# Patient Record
Sex: Male | Born: 1965 | Race: White | Hispanic: No | Marital: Single | Smoking: Current every day smoker
Health system: Southern US, Community
[De-identification: ages and names within clinical notes are randomized; demographics above are authoritative.]

## PROBLEM LIST (undated history)

## (undated) DIAGNOSIS — J449 Chronic obstructive pulmonary disease, unspecified: Secondary | ICD-10-CM

---

## 2017-05-08 ENCOUNTER — Emergency Department (HOSPITAL_COMMUNITY): Payer: No Typology Code available for payment source

## 2017-05-08 ENCOUNTER — Inpatient Hospital Stay (HOSPITAL_COMMUNITY)
Admission: EM | Admit: 2017-05-08 | Discharge: 2017-06-06 | DRG: 981 | Disposition: A | Discharge status: Rehabilitation Facility | Payer: No Typology Code available for payment source | Attending: General Surgery | Admitting: General Surgery

## 2017-05-08 ENCOUNTER — Inpatient Hospital Stay (HOSPITAL_COMMUNITY): Payer: No Typology Code available for payment source

## 2017-05-08 ENCOUNTER — Inpatient Hospital Stay (HOSPITAL_COMMUNITY): Payer: No Typology Code available for payment source | Admitting: Anesthesiology

## 2017-05-08 ENCOUNTER — Encounter (HOSPITAL_COMMUNITY): Payer: Self-pay | Admitting: Neurological Surgery

## 2017-05-08 ENCOUNTER — Encounter (HOSPITAL_COMMUNITY): Admission: EM | Disposition: A | Discharge status: Rehabilitation Facility | Payer: Self-pay | Source: Home / Self Care

## 2017-05-08 DIAGNOSIS — Z978 Presence of other specified devices: Secondary | ICD-10-CM

## 2017-05-08 DIAGNOSIS — Z23 Encounter for immunization: Secondary | ICD-10-CM

## 2017-05-08 DIAGNOSIS — Z9981 Dependence on supplemental oxygen: Secondary | ICD-10-CM

## 2017-05-08 DIAGNOSIS — S0285XA Fracture of orbit, unspecified, initial encounter for closed fracture: Secondary | ICD-10-CM

## 2017-05-08 DIAGNOSIS — S066X9A Traumatic subarachnoid hemorrhage with loss of consciousness of unspecified duration, initial encounter: Secondary | ICD-10-CM | POA: Diagnosis present

## 2017-05-08 DIAGNOSIS — S020XXA Fracture of vault of skull, initial encounter for closed fracture: Secondary | ICD-10-CM | POA: Diagnosis present

## 2017-05-08 DIAGNOSIS — R001 Bradycardia, unspecified: Secondary | ICD-10-CM | POA: Diagnosis not present

## 2017-05-08 DIAGNOSIS — R402352 Coma scale, best motor response, localizes pain, at arrival to emergency department: Secondary | ICD-10-CM | POA: Diagnosis present

## 2017-05-08 DIAGNOSIS — M25361 Other instability, right knee: Secondary | ICD-10-CM | POA: Diagnosis present

## 2017-05-08 DIAGNOSIS — T380X5A Adverse effect of glucocorticoids and synthetic analogues, initial encounter: Secondary | ICD-10-CM

## 2017-05-08 DIAGNOSIS — S52321A Displaced transverse fracture of shaft of right radius, initial encounter for closed fracture: Secondary | ICD-10-CM | POA: Diagnosis present

## 2017-05-08 DIAGNOSIS — D62 Acute posthemorrhagic anemia: Secondary | ICD-10-CM | POA: Diagnosis not present

## 2017-05-08 DIAGNOSIS — F411 Generalized anxiety disorder: Secondary | ICD-10-CM

## 2017-05-08 DIAGNOSIS — B9561 Methicillin susceptible Staphylococcus aureus infection as the cause of diseases classified elsewhere: Secondary | ICD-10-CM | POA: Diagnosis not present

## 2017-05-08 DIAGNOSIS — R609 Edema, unspecified: Secondary | ICD-10-CM

## 2017-05-08 DIAGNOSIS — S022XXA Fracture of nasal bones, initial encounter for closed fracture: Secondary | ICD-10-CM | POA: Diagnosis present

## 2017-05-08 DIAGNOSIS — E873 Alkalosis: Secondary | ICD-10-CM | POA: Diagnosis present

## 2017-05-08 DIAGNOSIS — Z4659 Encounter for fitting and adjustment of other gastrointestinal appliance and device: Secondary | ICD-10-CM

## 2017-05-08 DIAGNOSIS — Z419 Encounter for procedure for purposes other than remedying health state, unspecified: Secondary | ICD-10-CM

## 2017-05-08 DIAGNOSIS — E876 Hypokalemia: Secondary | ICD-10-CM | POA: Diagnosis not present

## 2017-05-08 DIAGNOSIS — R0689 Other abnormalities of breathing: Secondary | ICD-10-CM

## 2017-05-08 DIAGNOSIS — J9602 Acute respiratory failure with hypercapnia: Secondary | ICD-10-CM | POA: Diagnosis not present

## 2017-05-08 DIAGNOSIS — Y95 Nosocomial condition: Secondary | ICD-10-CM | POA: Diagnosis not present

## 2017-05-08 DIAGNOSIS — S0282XA Fracture of other specified skull and facial bones, left side, initial encounter for closed fracture: Secondary | ICD-10-CM | POA: Diagnosis present

## 2017-05-08 DIAGNOSIS — S5291XA Unspecified fracture of right forearm, initial encounter for closed fracture: Secondary | ICD-10-CM

## 2017-05-08 DIAGNOSIS — Z808 Family history of malignant neoplasm of other organs or systems: Secondary | ICD-10-CM

## 2017-05-08 DIAGNOSIS — R739 Hyperglycemia, unspecified: Secondary | ICD-10-CM

## 2017-05-08 DIAGNOSIS — Z9911 Dependence on respirator [ventilator] status: Secondary | ICD-10-CM | POA: Diagnosis not present

## 2017-05-08 DIAGNOSIS — I609 Nontraumatic subarachnoid hemorrhage, unspecified: Secondary | ICD-10-CM

## 2017-05-08 DIAGNOSIS — S5292XA Unspecified fracture of left forearm, initial encounter for closed fracture: Secondary | ICD-10-CM

## 2017-05-08 DIAGNOSIS — T1490XA Injury, unspecified, initial encounter: Secondary | ICD-10-CM | POA: Diagnosis present

## 2017-05-08 DIAGNOSIS — S066X9D Traumatic subarachnoid hemorrhage with loss of consciousness of unspecified duration, subsequent encounter: Secondary | ICD-10-CM | POA: Diagnosis not present

## 2017-05-08 DIAGNOSIS — S0292XA Unspecified fracture of facial bones, initial encounter for closed fracture: Secondary | ICD-10-CM

## 2017-05-08 DIAGNOSIS — G9389 Other specified disorders of brain: Secondary | ICD-10-CM | POA: Diagnosis present

## 2017-05-08 DIAGNOSIS — E871 Hypo-osmolality and hyponatremia: Secondary | ICD-10-CM

## 2017-05-08 DIAGNOSIS — S069X9A Unspecified intracranial injury with loss of consciousness of unspecified duration, initial encounter: Secondary | ICD-10-CM

## 2017-05-08 DIAGNOSIS — Z01818 Encounter for other preprocedural examination: Secondary | ICD-10-CM

## 2017-05-08 DIAGNOSIS — R52 Pain, unspecified: Secondary | ICD-10-CM

## 2017-05-08 DIAGNOSIS — R402112 Coma scale, eyes open, never, at arrival to emergency department: Secondary | ICD-10-CM | POA: Diagnosis present

## 2017-05-08 DIAGNOSIS — S062X9A Diffuse traumatic brain injury with loss of consciousness of unspecified duration, initial encounter: Secondary | ICD-10-CM | POA: Diagnosis present

## 2017-05-08 DIAGNOSIS — J9690 Respiratory failure, unspecified, unspecified whether with hypoxia or hypercapnia: Secondary | ICD-10-CM

## 2017-05-08 DIAGNOSIS — G8918 Other acute postprocedural pain: Secondary | ICD-10-CM

## 2017-05-08 DIAGNOSIS — D6489 Other specified anemias: Secondary | ICD-10-CM | POA: Diagnosis not present

## 2017-05-08 DIAGNOSIS — S52221A Displaced transverse fracture of shaft of right ulna, initial encounter for closed fracture: Secondary | ICD-10-CM | POA: Diagnosis present

## 2017-05-08 DIAGNOSIS — J189 Pneumonia, unspecified organism: Secondary | ICD-10-CM | POA: Diagnosis not present

## 2017-05-08 DIAGNOSIS — R451 Restlessness and agitation: Secondary | ICD-10-CM

## 2017-05-08 DIAGNOSIS — T148XXA Other injury of unspecified body region, initial encounter: Secondary | ICD-10-CM

## 2017-05-08 DIAGNOSIS — R402222 Coma scale, best verbal response, incomprehensible words, at arrival to emergency department: Secondary | ICD-10-CM | POA: Diagnosis present

## 2017-05-08 DIAGNOSIS — S0291XA Unspecified fracture of skull, initial encounter for closed fracture: Secondary | ICD-10-CM | POA: Diagnosis present

## 2017-05-08 DIAGNOSIS — D72829 Elevated white blood cell count, unspecified: Secondary | ICD-10-CM

## 2017-05-08 DIAGNOSIS — Z801 Family history of malignant neoplasm of trachea, bronchus and lung: Secondary | ICD-10-CM

## 2017-05-08 DIAGNOSIS — S299XXA Unspecified injury of thorax, initial encounter: Secondary | ICD-10-CM

## 2017-05-08 DIAGNOSIS — F17210 Nicotine dependence, cigarettes, uncomplicated: Secondary | ICD-10-CM | POA: Diagnosis present

## 2017-05-08 DIAGNOSIS — S83519A Sprain of anterior cruciate ligament of unspecified knee, initial encounter: Secondary | ICD-10-CM

## 2017-05-08 DIAGNOSIS — Z781 Physical restraint status: Secondary | ICD-10-CM

## 2017-05-08 DIAGNOSIS — F1910 Other psychoactive substance abuse, uncomplicated: Secondary | ICD-10-CM

## 2017-05-08 DIAGNOSIS — S52202A Unspecified fracture of shaft of left ulna, initial encounter for closed fracture: Secondary | ICD-10-CM

## 2017-05-08 HISTORY — PX: ORIF RADIAL FRACTURE: SHX5113

## 2017-05-08 LAB — I-STAT CHEM 8, ED
BUN: 19 mg/dL (ref 6–20)
CREATININE: 1.1 mg/dL (ref 0.61–1.24)
Calcium, Ion: 1.12 mmol/L — ABNORMAL LOW (ref 1.15–1.40)
Chloride: 105 mmol/L (ref 101–111)
Glucose, Bld: 117 mg/dL — ABNORMAL HIGH (ref 65–99)
HEMATOCRIT: 43 % (ref 39.0–52.0)
HEMOGLOBIN: 14.6 g/dL (ref 13.0–17.0)
Potassium: 3.8 mmol/L (ref 3.5–5.1)
Sodium: 140 mmol/L (ref 135–145)
TCO2: 25 mmol/L (ref 22–32)

## 2017-05-08 LAB — POCT I-STAT 3, ART BLOOD GAS (G3+)
Acid-base deficit: 4 mmol/L — ABNORMAL HIGH (ref 0.0–2.0)
Bicarbonate: 21.3 mmol/L (ref 20.0–28.0)
O2 Saturation: 98 %
PCO2 ART: 37.8 mmHg (ref 32.0–48.0)
PH ART: 7.358 (ref 7.350–7.450)
TCO2: 22 mmol/L (ref 22–32)
pO2, Arterial: 118 mmHg — ABNORMAL HIGH (ref 83.0–108.0)

## 2017-05-08 LAB — PREPARE FRESH FROZEN PLASMA
UNIT DIVISION: 0
Unit division: 0

## 2017-05-08 LAB — BPAM FFP
BLOOD PRODUCT EXPIRATION DATE: 201812232359
Blood Product Expiration Date: 201812232359
ISSUE DATE / TIME: 201812110144
ISSUE DATE / TIME: 201812110144
UNIT TYPE AND RH: 6200
Unit Type and Rh: 6200

## 2017-05-08 LAB — URINALYSIS, ROUTINE W REFLEX MICROSCOPIC
Glucose, UA: NEGATIVE mg/dL
KETONES UR: NEGATIVE mg/dL
Leukocytes, UA: NEGATIVE
Nitrite: NEGATIVE
PH: 5 (ref 5.0–8.0)
Protein, ur: 100 mg/dL — AB
Specific Gravity, Urine: 1.03 (ref 1.005–1.030)
Squamous Epithelial / LPF: NONE SEEN

## 2017-05-08 LAB — CBC
HEMATOCRIT: 40.6 % (ref 39.0–52.0)
Hemoglobin: 14 g/dL (ref 13.0–17.0)
MCH: 30 pg (ref 26.0–34.0)
MCHC: 34.5 g/dL (ref 30.0–36.0)
MCV: 87.1 fL (ref 78.0–100.0)
Platelets: 396 10*3/uL (ref 150–400)
RBC: 4.66 MIL/uL (ref 4.22–5.81)
RDW: 13.2 % (ref 11.5–15.5)
WBC: 18.5 10*3/uL — ABNORMAL HIGH (ref 4.0–10.5)

## 2017-05-08 LAB — I-STAT ARTERIAL BLOOD GAS, ED
ACID-BASE DEFICIT: 2 mmol/L (ref 0.0–2.0)
Bicarbonate: 26.5 mmol/L (ref 20.0–28.0)
O2 SAT: 100 %
PH ART: 7.238 — AB (ref 7.350–7.450)
TCO2: 28 mmol/L (ref 22–32)
pCO2 arterial: 62 mmHg — ABNORMAL HIGH (ref 32.0–48.0)
pO2, Arterial: 442 mmHg — ABNORMAL HIGH (ref 83.0–108.0)

## 2017-05-08 LAB — COMPREHENSIVE METABOLIC PANEL
ALT: 54 U/L (ref 17–63)
AST: 94 U/L — AB (ref 15–41)
Albumin: 3.9 g/dL (ref 3.5–5.0)
Alkaline Phosphatase: 104 U/L (ref 38–126)
Anion gap: 12 (ref 5–15)
BILIRUBIN TOTAL: 0.4 mg/dL (ref 0.3–1.2)
BUN: 18 mg/dL (ref 6–20)
CO2: 22 mmol/L (ref 22–32)
CREATININE: 1.23 mg/dL (ref 0.61–1.24)
Calcium: 8.9 mg/dL (ref 8.9–10.3)
Chloride: 102 mmol/L (ref 101–111)
GFR, EST AFRICAN AMERICAN: 40 mL/min — AB (ref >60)
GFR, EST NON AFRICAN AMERICAN: 34 mL/min — AB (ref >60)
Glucose, Bld: 114 mg/dL — ABNORMAL HIGH (ref 65–99)
POTASSIUM: 3.7 mmol/L (ref 3.5–5.1)
Sodium: 136 mmol/L (ref 135–145)
Total Protein: 7.5 g/dL (ref 6.5–8.1)

## 2017-05-08 LAB — TRIGLYCERIDES: TRIGLYCERIDES: 64 mg/dL (ref <150)

## 2017-05-08 LAB — LACTIC ACID, PLASMA: Lactic Acid, Venous: 0.9 mmol/L (ref 0.5–1.9)

## 2017-05-08 LAB — I-STAT CG4 LACTIC ACID, ED: LACTIC ACID, VENOUS: 2.9 mmol/L — AB (ref 0.5–1.9)

## 2017-05-08 LAB — RAPID URINE DRUG SCREEN, HOSP PERFORMED
AMPHETAMINES: POSITIVE — AB
Barbiturates: NOT DETECTED
Benzodiazepines: NOT DETECTED
Cocaine: NOT DETECTED
Opiates: POSITIVE — AB
TETRAHYDROCANNABINOL: POSITIVE — AB

## 2017-05-08 LAB — BLOOD PRODUCT ORDER (VERBAL) VERIFICATION

## 2017-05-08 LAB — CBG MONITORING, ED: GLUCOSE-CAPILLARY: 125 mg/dL — AB (ref 65–99)

## 2017-05-08 LAB — ETHANOL: Alcohol, Ethyl (B): <10 mg/dL (ref <10)

## 2017-05-08 LAB — CDS SEROLOGY

## 2017-05-08 LAB — ABO/RH: ABO/RH(D): AB POS

## 2017-05-08 LAB — PROTIME-INR
INR: 1.12
Prothrombin Time: 14.3 seconds (ref 11.4–15.2)

## 2017-05-08 SURGERY — OPEN REDUCTION INTERNAL FIXATION (ORIF) RADIAL FRACTURE
Anesthesia: General | Site: Arm Lower | Laterality: Right

## 2017-05-08 MED ORDER — CEFAZOLIN SODIUM-DEXTROSE 2-3 GM-%(50ML) IV SOLR
INTRAVENOUS | Status: DC | PRN
  Administered 2017-05-08: 2 g via INTRAVENOUS

## 2017-05-08 MED ORDER — CHLORHEXIDINE GLUCONATE 0.12% ORAL RINSE (MEDLINE KIT)
15.0000 mL | Freq: Two times a day (BID) | OROMUCOSAL | Status: DC
Start: 2017-05-08 — End: 2017-05-26
  Administered 2017-05-08 – 2017-05-25 (×35): 15 mL via OROMUCOSAL

## 2017-05-08 MED ORDER — FENTANYL CITRATE (PF) 250 MCG/5ML IJ SOLN
INTRAMUSCULAR | Status: AC
  Filled 2017-05-08: qty 5

## 2017-05-08 MED ORDER — VANCOMYCIN HCL 1000 MG IV SOLR
INTRAVENOUS | Status: DC | PRN
  Administered 2017-05-08: 1000 mg

## 2017-05-08 MED ORDER — SUCCINYLCHOLINE CHLORIDE 20 MG/ML IJ SOLN
INTRAMUSCULAR | Status: AC | PRN
  Administered 2017-05-08: 150 mg via INTRAVENOUS

## 2017-05-08 MED ORDER — FENTANYL 2500MCG IN NS 250ML (10MCG/ML) PREMIX INFUSION
25.0000 ug/h | INTRAVENOUS | Status: DC
  Administered 2017-05-08: 100 ug/h via INTRAVENOUS
  Administered 2017-05-09: 150 ug/h via INTRAVENOUS
  Administered 2017-05-10 – 2017-05-12 (×5): 200 ug/h via INTRAVENOUS
  Filled 2017-05-08 (×7): qty 250

## 2017-05-08 MED ORDER — FENTANYL 2500MCG IN NS 250ML (10MCG/ML) PREMIX INFUSION
20.0000 ug/h | INTRAVENOUS | Status: DC
  Administered 2017-05-08: 50 ug/h via INTRAVENOUS
  Filled 2017-05-08: qty 250

## 2017-05-08 MED ORDER — ONDANSETRON HCL 4 MG/2ML IJ SOLN
4.0000 mg | Freq: Four times a day (QID) | INTRAMUSCULAR | Status: DC | PRN
Start: 2017-05-08 — End: 2017-06-06
  Administered 2017-05-29 – 2017-06-05 (×6): 4 mg via INTRAVENOUS
  Filled 2017-05-08 (×6): qty 2

## 2017-05-08 MED ORDER — MIDAZOLAM HCL 2 MG/2ML IJ SOLN
INTRAMUSCULAR | Status: AC
  Filled 2017-05-08: qty 2

## 2017-05-08 MED ORDER — BACITRACIN ZINC 500 UNIT/GM EX OINT
TOPICAL_OINTMENT | CUTANEOUS | Status: AC
  Filled 2017-05-08: qty 28.35

## 2017-05-08 MED ORDER — SODIUM CHLORIDE 0.9% FLUSH
10.0000 mL | INTRAVENOUS | Status: DC | PRN
  Administered 2017-05-29: 10 mL
  Filled 2017-05-08: qty 40

## 2017-05-08 MED ORDER — FENTANYL BOLUS VIA INFUSION
50.0000 ug | INTRAVENOUS | Status: DC | PRN
  Administered 2017-05-09 – 2017-05-10 (×5): 50 ug via INTRAVENOUS
  Filled 2017-05-08: qty 50

## 2017-05-08 MED ORDER — FENTANYL CITRATE (PF) 100 MCG/2ML IJ SOLN
50.0000 ug | Freq: Once | INTRAMUSCULAR | Status: AC
  Administered 2017-05-08: 100 ug via INTRAVENOUS
  Administered 2017-05-08 (×3): 50 ug via INTRAVENOUS

## 2017-05-08 MED ORDER — SODIUM CHLORIDE 0.9 % IV SOLN
INTRAVENOUS | Status: AC
  Administered 2017-05-08 – 2017-05-22 (×17): via INTRAVENOUS
  Administered 2017-05-23: 40 mL/h via INTRAVENOUS
  Administered 2017-05-24: 11:00:00 via INTRAVENOUS
  Administered 2017-05-25: 40 mL/h via INTRAVENOUS
  Administered 2017-05-26: 1000 mL via INTRAVENOUS

## 2017-05-08 MED ORDER — MIDAZOLAM HCL 5 MG/5ML IJ SOLN
INTRAMUSCULAR | Status: DC | PRN
  Administered 2017-05-08: 2 mg via INTRAVENOUS

## 2017-05-08 MED ORDER — ORAL CARE MOUTH RINSE
15.0000 mL | OROMUCOSAL | Status: DC
  Administered 2017-05-08 – 2017-05-25 (×172): 15 mL via OROMUCOSAL

## 2017-05-08 MED ORDER — IPRATROPIUM-ALBUTEROL 0.5-2.5 (3) MG/3ML IN SOLN
3.0000 mL | RESPIRATORY_TRACT | Status: DC | PRN
  Filled 2017-05-08: qty 3

## 2017-05-08 MED ORDER — PANTOPRAZOLE SODIUM 40 MG PO TBEC
40.0000 mg | DELAYED_RELEASE_TABLET | Freq: Every day | ORAL | Status: DC
  Administered 2017-05-12 – 2017-05-30 (×4): 40 mg via ORAL
  Filled 2017-05-08 (×5): qty 1

## 2017-05-08 MED ORDER — VANCOMYCIN HCL 1000 MG IV SOLR
INTRAVENOUS | Status: AC
  Filled 2017-05-08: qty 1000

## 2017-05-08 MED ORDER — HYDRALAZINE HCL 20 MG/ML IJ SOLN
10.0000 mg | INTRAMUSCULAR | Status: DC | PRN
  Administered 2017-05-16: 10 mg via INTRAVENOUS
  Filled 2017-05-08: qty 1

## 2017-05-08 MED ORDER — TETANUS-DIPHTH-ACELL PERTUSSIS 5-2.5-18.5 LF-MCG/0.5 IM SUSP
0.5000 mL | Freq: Once | INTRAMUSCULAR | Status: AC
  Administered 2017-05-08: 0.5 mL via INTRAMUSCULAR

## 2017-05-08 MED ORDER — 0.9 % SODIUM CHLORIDE (POUR BTL) OPTIME
TOPICAL | Status: DC | PRN
  Administered 2017-05-08: 1000 mL

## 2017-05-08 MED ORDER — PANTOPRAZOLE SODIUM 40 MG IV SOLR
40.0000 mg | Freq: Every day | INTRAVENOUS | Status: DC
  Administered 2017-05-08 – 2017-05-28 (×19): 40 mg via INTRAVENOUS
  Filled 2017-05-08 (×19): qty 40

## 2017-05-08 MED ORDER — PROPOFOL 1000 MG/100ML IV EMUL
INTRAVENOUS | Status: AC
  Administered 2017-05-08: 20 mg/kg/h
  Filled 2017-05-08: qty 100

## 2017-05-08 MED ORDER — SODIUM CHLORIDE 0.9% FLUSH
10.0000 mL | Freq: Two times a day (BID) | INTRAVENOUS | Status: DC
  Administered 2017-05-08 – 2017-05-13 (×8): 10 mL
  Administered 2017-05-13: 20 mL
  Administered 2017-05-14 – 2017-05-16 (×6): 10 mL
  Administered 2017-05-17: 30 mL
  Administered 2017-05-17 – 2017-06-05 (×37): 10 mL
  Administered 2017-06-06: 20 mL

## 2017-05-08 MED ORDER — ROCURONIUM BROMIDE 100 MG/10ML IV SOLN
INTRAVENOUS | Status: DC | PRN
  Administered 2017-05-08: 20 mg via INTRAVENOUS
  Administered 2017-05-08: 30 mg via INTRAVENOUS
  Administered 2017-05-08: 50 mg via INTRAVENOUS

## 2017-05-08 MED ORDER — BISACODYL 10 MG RE SUPP
10.0000 mg | Freq: Every day | RECTAL | Status: DC | PRN
  Administered 2017-05-11 – 2017-05-20 (×3): 10 mg via RECTAL
  Filled 2017-05-08 (×4): qty 1

## 2017-05-08 MED ORDER — IOPAMIDOL (ISOVUE-300) INJECTION 61%
INTRAVENOUS | Status: AC
  Administered 2017-05-08: 100 mL
  Filled 2017-05-08: qty 100

## 2017-05-08 MED ORDER — DOCUSATE SODIUM 50 MG/5ML PO LIQD
100.0000 mg | Freq: Two times a day (BID) | ORAL | Status: DC | PRN
  Administered 2017-05-10 – 2017-05-31 (×4): 100 mg
  Filled 2017-05-08 (×5): qty 10

## 2017-05-08 MED ORDER — CEFAZOLIN SODIUM-DEXTROSE 1-4 GM/50ML-% IV SOLN
1.0000 g | Freq: Three times a day (TID) | INTRAVENOUS | Status: AC
  Administered 2017-05-08 – 2017-05-09 (×3): 1 g via INTRAVENOUS
  Filled 2017-05-08 (×3): qty 50

## 2017-05-08 MED ORDER — CHLORHEXIDINE GLUCONATE CLOTH 2 % EX PADS
6.0000 | MEDICATED_PAD | Freq: Every day | CUTANEOUS | Status: DC
Start: 2017-05-08 — End: 2017-05-09
  Administered 2017-05-08: 6 via TOPICAL

## 2017-05-08 MED ORDER — ONDANSETRON 4 MG PO TBDP: 4.0000 mg | ORAL_TABLET | Freq: Four times a day (QID) | ORAL | Status: DC | PRN

## 2017-05-08 MED ORDER — PROPOFOL 1000 MG/100ML IV EMUL
0.0000 ug/kg/min | INTRAVENOUS | Status: DC
  Administered 2017-05-08: 15 ug/kg/min via INTRAVENOUS
  Administered 2017-05-09: 10 ug/kg/min via INTRAVENOUS
  Administered 2017-05-10: 15 ug/kg/min via INTRAVENOUS
  Administered 2017-05-10 – 2017-05-11 (×4): 20 ug/kg/min via INTRAVENOUS
  Administered 2017-05-11 – 2017-05-12 (×2): 25 ug/kg/min via INTRAVENOUS
  Administered 2017-05-12: 21 ug/kg/min via INTRAVENOUS
  Administered 2017-05-13 (×2): 35.111 ug/kg/min via INTRAVENOUS
  Administered 2017-05-13: 35 ug/kg/min via INTRAVENOUS
  Administered 2017-05-14: 20 ug/kg/min via INTRAVENOUS
  Administered 2017-05-14: 15 ug/kg/min via INTRAVENOUS
  Administered 2017-05-15: 30 ug/kg/min via INTRAVENOUS
  Administered 2017-05-15: 20 ug/kg/min via INTRAVENOUS
  Administered 2017-05-15 – 2017-05-16 (×3): 40 ug/kg/min via INTRAVENOUS
  Filled 2017-05-08 (×23): qty 100

## 2017-05-08 MED ORDER — ETOMIDATE 2 MG/ML IV SOLN
INTRAVENOUS | Status: AC | PRN
  Administered 2017-05-08: 20 mg via INTRAVENOUS

## 2017-05-08 MED ORDER — PROPOFOL 1000 MG/100ML IV EMUL
INTRAVENOUS | Status: AC
  Administered 2017-05-08: 04:00:00
  Filled 2017-05-08: qty 100

## 2017-05-08 MED ORDER — LACTATED RINGERS IV SOLN
INTRAVENOUS | Status: DC | PRN
  Administered 2017-05-08: 13:00:00 via INTRAVENOUS

## 2017-05-08 MED ORDER — TETANUS-DIPHTH-ACELL PERTUSSIS 5-2.5-18.5 LF-MCG/0.5 IM SUSP
INTRAMUSCULAR | Status: AC
  Filled 2017-05-08: qty 0.5

## 2017-05-08 SURGICAL SUPPLY — 60 items
BANDAGE ACE 3X5.8 VEL STRL LF (GAUZE/BANDAGES/DRESSINGS) ×2 IMPLANT
BANDAGE ACE 4X5 VEL STRL LF (GAUZE/BANDAGES/DRESSINGS) ×2 IMPLANT
BIT DRILL 2.5X110 QC LCP DISP (BIT) ×2 IMPLANT
BIT DRILL LONG 2.7 (BIT) ×1 IMPLANT
BIT DRILL QC 2X125 (BIT) ×1 IMPLANT
BLADE MINI RND TIP GREEN BEAV (BLADE) ×2 IMPLANT
BLADE SURG 15 STRL LF DISP TIS (BLADE) ×1 IMPLANT
BLADE SURG 15 STRL SS (BLADE) ×1
BNDG ESMARK 4X9 LF (GAUZE/BANDAGES/DRESSINGS) ×2 IMPLANT
BRUSH SCRUB EZ PLAIN DRY (MISCELLANEOUS) ×2 IMPLANT
CANISTER SUCT 3000ML PPV (MISCELLANEOUS) ×2 IMPLANT
CHLORAPREP W/TINT 26ML (MISCELLANEOUS) ×2 IMPLANT
COVER BACK TABLE 60X90IN (DRAPES) ×2 IMPLANT
COVER SURGICAL LIGHT HANDLE (MISCELLANEOUS) ×2 IMPLANT
CUFF TOURNIQUET SINGLE 18IN (TOURNIQUET CUFF) ×2 IMPLANT
DRAPE C-ARM 42X72 X-RAY (DRAPES) ×2 IMPLANT
DRAPE SURG 17X23 STRL (DRAPES) ×2 IMPLANT
DRILL BIT LONG 2.7 (BIT) ×2
DRILL BIT QC 2X125 (BIT) ×1
DRSG ADAPTIC 3X8 NADH LF (GAUZE/BANDAGES/DRESSINGS) ×2 IMPLANT
GAUZE SPONGE 4X4 12PLY STRL LF (GAUZE/BANDAGES/DRESSINGS) ×2 IMPLANT
GLOVE BIO SURGEON STRL SZ7.5 (GLOVE) ×12 IMPLANT
GLOVE BIOGEL PI IND STRL 8 (GLOVE) ×2 IMPLANT
GLOVE BIOGEL PI INDICATOR 8 (GLOVE) ×2
GLOVE ECLIPSE 7.5 STRL STRAW (GLOVE) ×2 IMPLANT
GOWN STRL REUS W/ TWL XL LVL3 (GOWN DISPOSABLE) ×5 IMPLANT
GOWN STRL REUS W/TWL XL LVL3 (GOWN DISPOSABLE) ×5
KIT BASIN OR (CUSTOM PROCEDURE TRAY) ×2 IMPLANT
NS IRRIG 1000ML POUR BTL (IV SOLUTION) ×2 IMPLANT
PACK ORTHO EXTREMITY (CUSTOM PROCEDURE TRAY) ×2 IMPLANT
PAD ABD 8X10 STRL (GAUZE/BANDAGES/DRESSINGS) ×2 IMPLANT
PAD CAST 4YDX4 CTTN HI CHSV (CAST SUPPLIES) ×1 IMPLANT
PADDING CAST COTTON 4X4 STRL (CAST SUPPLIES) ×1
PENCIL BUTTON HOLSTER BLD 10FT (ELECTRODE) ×2 IMPLANT
PLATE LCP 3.5 7H 98 (Plate) ×4 IMPLANT
SCREW BN 2.5XFT 20X2.7X5X CORT (Screw) ×1 IMPLANT
SCREW CORT ST 2.7X20 (Screw) ×1 IMPLANT
SCREW CORTEX 3.5 16MM (Screw) ×2 IMPLANT
SCREW CORTEX 3.5 18MM (Screw) ×5 IMPLANT
SCREW CORTEX 3.5 20MM (Screw) ×5 IMPLANT
SCREW LOCK CORT ST 3.5X16 (Screw) ×2 IMPLANT
SCREW LOCK CORT ST 3.5X18 (Screw) ×5 IMPLANT
SCREW LOCK CORT ST 3.5X20 (Screw) ×5 IMPLANT
SCREW LOCK T15 FT 18X3.5X2.9X (Screw) ×1 IMPLANT
SCREW LOCKING 3.5X18 (Screw) ×1 IMPLANT
SPONGE LAP 4X18 X RAY DECT (DISPOSABLE) ×2 IMPLANT
SUCTION FRAZIER HANDLE 10FR (MISCELLANEOUS) ×2
SUCTION TUBE FRAZIER 10FR DISP (MISCELLANEOUS) ×2 IMPLANT
SUT ETHILON 3 0 PS 1 (SUTURE) ×8 IMPLANT
SUT ETHILON 5 0 P 3 18 (SUTURE) ×1
SUT NYLON ETHILON 5-0 P-3 1X18 (SUTURE) ×1 IMPLANT
SUT VIC AB 0 CT1 27 (SUTURE) ×1
SUT VIC AB 0 CT1 27XBRD ANBCTR (SUTURE) ×1 IMPLANT
SUT VIC AB 1 CT1 27 (SUTURE) ×1
SUT VIC AB 1 CT1 27XBRD ANTBC (SUTURE) ×1 IMPLANT
SUT VIC AB 2-0 CT1 27 (SUTURE) ×3
SUT VIC AB 2-0 CT1 TAPERPNT 27 (SUTURE) ×3 IMPLANT
TOWEL GREEN STERILE (TOWEL DISPOSABLE) ×2 IMPLANT
TUBE CONNECTING 12X1/4 (SUCTIONS) ×2 IMPLANT
UNDERPAD 30X30 (UNDERPADS AND DIAPERS) ×2 IMPLANT

## 2017-05-08 NOTE — ED Notes (Signed)
Pt in CT at this time on CCM with RN, OG reconnected to suction on arrival to CT
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
## 2017-05-08 NOTE — Progress Notes (Signed)
Pt valuables invoice added to pts chart, Belongings sent to safe by ED RN. Envelope no. [id number] bag No. [id number]
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
## 2017-05-08 NOTE — ED Provider Notes (Signed)
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 right. There is right-sided retro bulb are hemorrhage. Globes appear intact. No proptosis. Sinuses: Diffuse opacification and fluid levels throughout paranasal sinuses is likely to represent hemorrhage. Sclerosis of right mastoid L cells compatible with sequelae of chronic otomastoiditis. Normal aeration of left mastoid air cells. Soft tissues: Diffuse edema throughout periorbital, nasal, and anterior facial soft tissues. CT CERVICAL SPINE FINDINGS Alignment: Straightening of cervical lordosis without listhesis. Skull base and vertebrae: No acute fracture. No primary bone lesion or focal pathologic process. Soft tissues and spinal canal: No prevertebral fluid or swelling. No visible canal hematoma. Disc levels: Mild cervical spondylosis with multilevel disc and facet degenerative changes. Left-sided uncovertebral and facet hypertrophy encroaches on the bony neural foramen at the C6-T2 levels and on the right at the C5-T1 levels. Upper chest: Negative. Other: Partially visualize right submandibular space lipoma. The patient is intubated with enteric tube. IMPRESSION: CT head: 1. Right paramedian frontal bone depressed and distracted fracture. 2. Right greater than left  anterior frontal lobe hemorrhagic cortical contusions and small volume of subarachnoid hemorrhage. Mild associated mass effect with partial effacement of frontal horns of lateral ventricles. No downward herniation at this time. 3. Small volume pneumocephalus likely due to comminuted fractures in the anterior cranial fossa into ethmoid air cells. CT maxillofacial: 1. Complex comminuted mid facial fracture with depression of nasal bones and fractures extending into the bilateral medial wall of orbits, superior wall of orbits, fovea ethmoidalis, and cribriform plate. 2. Comminuted fracture of left orbital apex may affect left optic nerve. 3. Comminuted fracture in region of left medial canthal ligament and disruption of the left nasolacrimal duct. 4. No findings for facial dislocation. No mandibular fracture or dislocation. 5. Hemorrhage throughout paranasal sinuses. 6. Extraconal hemorrhage in the right greater than left orbits and small volume right retrobulbar hemorrhage. No proptosis at this time. CT cervical spine. Negative CT of the cervical spine. These results were called by telephone at the time of interpretation on 05/08/2017 at 3:36 am to Dr. Harlon Flor, who verbally acknowledged these results. Electronically Signed   By: Mitzi Hansen M.D.   On: 05/08/2017 03:45    Procedures intubation Date/Time: 05/08/2017 2:48 AM Performed by: Zadie Rhine, MD Oxygen Delivery Method: Nasal cannula Induction Type: Rapid sequence Laryngoscope Size: Glidescope Grade View: Grade I Tube size: 7.5 mm Number of attempts: 1 Placement Confirmation: ETT inserted through vocal cords under direct vision,  CO2 detector and Breath sounds checked- equal and bilateral      SPLINT APPLICATION Date/Time: 4:02 AM Authorized by: Joya Gaskins Consent: Verbal consent obtained. Risks and benefits: risks, benefits and alternatives were discussed Consent given by: patient Splint applied by: orthopedic  technician Location details: Right arm Splint type: Sugar tong Supplies used: Ortho-Glass Post-procedure: The splinted body part was neurovascularly unchanged following the procedure. Patient tolerance: Patient tolerated the procedure well with no immediate complications.    CRITICAL CARE Performed by: Joya Gaskins Total critical care time: 35 minutes Critical care time was exclusive of separately billable procedures and treating other patients. Critical care was necessary to  treat or prevent imminent or life-threatening deterioration. Critical care was time spent personally by me on the following activities: development of treatment plan with patient and/or surrogate as well as nursing, discussions with consultants, evaluation of patient's response to treatment, examination of patient, obtaining history from patient or surrogate, ordering and performing treatments and interventions, ordering and review of laboratory studies, ordering and review of radiographic studies, pulse oximetry and re-evaluation of patient's condition.   Medications Ordered in ED Medications  Tdap (BOOSTRIX) injection 0.5 mL (not administered)  propofol (DIPRIVAN) 1000 MG/100ML infusion (not administered)  Tdap (BOOSTRIX) 5-2.5-18.5 LF-MCG/0.5 injection (not administered)  fentaNYL in NS (59mcg/ml) infusion-PREMIX (not administered)  propofol (DIPRIVAN) 1000 MG/100ML infusion (not administered)  iopamidol (ISOVUE-300) 61 % injection (100 mLs  Contrast Given 05/08/17 0245)     Initial Impression / Assessment and Plan / ED Course  I have reviewed the triage vital signs and the nursing notes.  Pertinent labs & imaging results that were available during my care of the patient were reviewed by me and considered in my medical decision making (see chart for details).     2:49 AM Patient seen on arrival as level 1 trauma, in coordination with Dr. Corliss Skains from trauma Patient was initially  combative, however his GCS depressed to 4 on arrival, making intubation necessary Patient intubated without difficulty He was not hypotensive nor tachycardic in the ED Strong concern for head injury due to evidence of scalp abrasions and bruising CT imaging pending 4:02 AM Patient found to have extensive facial fractures Patient is also noted to have skull fracture as well as subarachnoid hemorrhage Dr. Corliss Skains with trauma has caused consults with neurosurgery, hand surgery, facial trauma call/ENT as well Patient will be admitted to the intensive care unit under the trauma service Patient was stabilized in the emergency department  Final Clinical Impressions(s) / ED Diagnoses   Final diagnoses:  Closed fracture of frontal bone, initial encounter (HCC)  SAH (subarachnoid hemorrhage) (HCC)  Closed fracture of right forearm, initial encounter  Closed fracture of orbit, initial encounter (HCC)  Closed fracture of nasal bone, initial encounter    ED Discharge Orders    None       Zadie Rhine, MD 05/08/17 [phone]
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
## 2017-05-08 NOTE — Progress Notes (Signed)
Orthopedic Tech Progress Note Patient Details:  Hiram ComberChincoteague Oo Doe 05/29/1874 [id number]  Ortho Devices Type of Ortho Device: Post (long arm) splint Ortho Device/Splint Location: rue Ortho Device/Splint Interventions: Ordered, Application, Adjustment   Post Interventions Patient Tolerated: Well Instructions Provided: Care of device, Adjustment of device I applied a posterior long arm splint after talking to the dr.   Trinna PostMartinez, Solangel Mcmanaway J 05/08/2017, 3:45 AM
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
## 2017-05-08 NOTE — Progress Notes (Signed)
Peripherally Inserted Central Catheter/Midline Placement  The IV Nurse has discussed with the patient and/or persons authorized to consent for the patient, the purpose of this procedure and the potential benefits and risks involved with this procedure.  The benefits include less needle sticks, lab draws from the catheter, and the patient may be discharged home with the catheter. Risks include, but not limited to, infection, bleeding, blood clot (thrombus formation), and puncture of an artery; nerve damage and irregular heartbeat and possibility to perform a PICC exchange if needed/ordered by physician.  Alternatives to this procedure were also discussed.  Bard Power PICC patient education guide, fact sheet on infection prevention and patient information card has been provided to patient /or left at bedside.    PICC/Midline Placement Documentation  PICC Triple Lumen 05/08/17 PICC Left Brachial 46 cm 2 cm (Active)  Indication for Insertion or Continuance of Line Prolonged intravenous therapies 05/08/2017 11:26 AM  Exposed Catheter (cm) 2 cm 05/08/2017 11:26 AM  Site Assessment Clean;Dry;Intact 05/08/2017 11:26 AM  Lumen #1 Status Flushed;Saline locked;Blood return noted 05/08/2017 11:26 AM  Lumen #2 Status Flushed;Saline locked;Blood return noted 05/08/2017 11:26 AM  Lumen #3 Status Flushed;Saline locked;Blood return noted 05/08/2017 11:26 AM  Dressing Type Transparent;Securing device 05/08/2017 11:26 AM  Dressing Status Clean;Dry;Intact;Antimicrobial disc in place 05/08/2017 11:26 AM  Dressing Change Due 05/15/17 05/08/2017 11:26 AM       Romie JumperAlford, Ceira Hoeschen Terry 05/08/2017, 11:30 AM
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
## 2017-05-08 NOTE — ED Notes (Signed)
Pt returned from CT °

## 2017-05-08 NOTE — ED Notes (Signed)
Officer [place] [phone] PD provided pictures of tattoos to assist with ID

## 2017-05-08 NOTE — Consult Note (Signed)
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 distracted fracture. 2. Right greater than left anterior frontal lobe hemorrhagic cortical contusions and small volume of subarachnoid hemorrhage. Mild associated mass effect with partial effacement of frontal horns of lateral ventricles. No downward herniation at this time. 3. Small volume pneumocephalus likely due to comminuted fractures in the anterior cranial fossa into ethmoid air cells. CT maxillofacial: 1. Complex comminuted mid facial fracture with depression of nasal bones and fractures extending into the bilateral medial wall of orbits, superior wall of orbits, fovea ethmoidalis, and cribriform plate. 2. Comminuted fracture of left orbital apex may affect left optic nerve. 3. Comminuted fracture in region of left medial canthal ligament and disruption of the left nasolacrimal duct. 4. No findings for facial dislocation. No mandibular fracture or dislocation. 5. Hemorrhage throughout paranasal sinuses. 6. Extraconal hemorrhage in the right greater than left orbits and small volume right retrobulbar hemorrhage. No proptosis at this time. CT cervical spine. Negative CT of the cervical spine. These results were called by telephone at the time of interpretation on 05/08/2017 at 3:36 am to Dr.  Harlon Flor, who verbally acknowledged these results. Electronically Signed   By: Mitzi Hansen M.D.   On: 05/08/2017 03:45     Assessment/Plan: CHI, no acute surgical intervention necessary, he seems quite purposeful and it this point I'm not convinced ICP monitoring offers an significant advantage. Will keep HOB elevated and repeat CT today. MAP is quite good. Trauma MD feels reasonable for UE surgery, and I agree it seems reasonable to move forward with fixation   Stone Spirito S 05/08/2017 9:09 AM

## 2017-05-08 NOTE — ED Notes (Signed)
[place] police called for information, large [employer] pickup truck mirror found near where patient found. Pt ID still unkn at this time.  They will follow up tomorrow for update.

## 2017-05-08 NOTE — ED Notes (Signed)
[facility] for [employer] [facility]

## 2017-05-08 NOTE — ED Notes (Signed)
Chaplain at bedside, all belonging searched, no ID or cellphone found.

## 2017-05-08 NOTE — Progress Notes (Signed)
Patient ID: Patrick [name], male   DOB: [birth year], 51 y.o.   MRN: [id number] Law enforcement identified him. I met with his twin sister and updated her. She reports he has been living with a friend.  Georganna Skeans, MD, MPH, FACS Trauma: [phone] General Surgery: [phone]

## 2017-05-08 NOTE — ED Triage Notes (Signed)
Pt BIB [facility], pt found on the side of [address] in [place] unresponsive with noted facial trauma and deformity to R forearm. Enroute pt became combative with incomprehensible speech. Unable to obtain IV
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
## 2017-05-08 NOTE — ED Notes (Signed)
Paged Hand(Thompson) for Tsuei

## 2017-05-08 NOTE — ED Notes (Signed)
Ortho tech at bedside for R arm splint
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
## 2017-05-08 NOTE — Transfer of Care (Signed)
Immediate Anesthesia Transfer of Care Note  Patient: Patrick [name]  Procedure(s) Performed: OPEN REDUCTION INTERNAL FIXATION (ORIF) BOTH BONE ARM FRACTURES (Right Arm Lower)  Patient Location: ICU  Anesthesia Type:General  Level of Consciousness: unresponsive, drowsy and Patient remains intubated per anesthesia plan  Airway & Oxygen Therapy: Patient placed on Ventilator (see vital sign flow sheet for setting)  Post-op Assessment: Report given to RN and Post -op Vital signs reviewed and stable  Post vital signs: Reviewed and stable  Last Vitals:  Vitals:   05/08/17 1000 05/08/17 1153  BP: 94/61 (!) 106/55  Pulse: (!) 59 61  Resp: (!) 26 (!) 32  Temp: (!) 36.4 C   SpO2: 100%     Last Pain:  Vitals:   05/08/17 0530  TempSrc: Core (Comment)         Complications: No apparent anesthesia complications

## 2017-05-09 ENCOUNTER — Inpatient Hospital Stay (HOSPITAL_COMMUNITY): Payer: No Typology Code available for payment source

## 2017-05-09 DIAGNOSIS — S52202A Unspecified fracture of shaft of left ulna, initial encounter for closed fracture: Secondary | ICD-10-CM

## 2017-05-09 DIAGNOSIS — S0292XA Unspecified fracture of facial bones, initial encounter for closed fracture: Secondary | ICD-10-CM

## 2017-05-09 DIAGNOSIS — S83519A Sprain of anterior cruciate ligament of unspecified knee, initial encounter: Secondary | ICD-10-CM

## 2017-05-09 DIAGNOSIS — S5292XA Unspecified fracture of left forearm, initial encounter for closed fracture: Secondary | ICD-10-CM

## 2017-05-09 LAB — CBC
HEMATOCRIT: 28.5 % — AB (ref 39.0–52.0)
Hemoglobin: 9.5 g/dL — ABNORMAL LOW (ref 13.0–17.0)
MCH: 29 pg (ref 26.0–34.0)
MCHC: 33.3 g/dL (ref 30.0–36.0)
MCV: 86.9 fL (ref 78.0–100.0)
Platelets: 337 10*3/uL (ref 150–400)
RBC: 3.28 MIL/uL — ABNORMAL LOW (ref 4.22–5.81)
RDW: 13.4 % (ref 11.5–15.5)
WBC: 9 10*3/uL (ref 4.0–10.5)

## 2017-05-09 LAB — BPAM RBC
BLOOD PRODUCT EXPIRATION DATE: 201812212359
Blood Product Expiration Date: 201812222359
ISSUE DATE / TIME: 201812110143
ISSUE DATE / TIME: 201812110143
UNIT TYPE AND RH: 9500
UNIT TYPE AND RH: 9500

## 2017-05-09 LAB — TYPE AND SCREEN
ABO/RH(D): AB POS
Antibody Screen: NEGATIVE
UNIT DIVISION: 0
UNIT DIVISION: 0

## 2017-05-09 LAB — BLOOD GAS, ARTERIAL
Acid-base deficit: 0.3 mmol/L (ref 0.0–2.0)
Bicarbonate: 23.5 mmol/L (ref 20.0–28.0)
DRAWN BY: 511911
FIO2: 40
MECHVT: 550 mL
O2 SAT: 95.9 %
PATIENT TEMPERATURE: 98.6
PCO2 ART: 36.3 mmHg (ref 32.0–48.0)
PEEP: 5 cmH2O
PH ART: 7.427 (ref 7.350–7.450)
RATE: 26 resp/min
pO2, Arterial: 78.7 mmHg — ABNORMAL LOW (ref 83.0–108.0)

## 2017-05-09 LAB — BASIC METABOLIC PANEL
Anion gap: 9 (ref 5–15)
BUN: 18 mg/dL (ref 6–20)
CALCIUM: 7.5 mg/dL — AB (ref 8.9–10.3)
CO2: 21 mmol/L — AB (ref 22–32)
CREATININE: 0.77 mg/dL (ref 0.61–1.24)
Chloride: 108 mmol/L (ref 101–111)
Glucose, Bld: 110 mg/dL — ABNORMAL HIGH (ref 65–99)
Potassium: 3.1 mmol/L — ABNORMAL LOW (ref 3.5–5.1)
Sodium: 138 mmol/L (ref 135–145)

## 2017-05-09 LAB — PROTIME-INR
INR: 1.25
Prothrombin Time: 15.6 seconds — ABNORMAL HIGH (ref 11.4–15.2)

## 2017-05-09 MED ORDER — ALTEPLASE 2 MG IJ SOLR
2.0000 mg | Freq: Once | INTRAMUSCULAR | Status: AC
  Administered 2017-05-09: 2 mg

## 2017-05-09 MED ORDER — CHLORHEXIDINE GLUCONATE CLOTH 2 % EX PADS
6.0000 | MEDICATED_PAD | Freq: Every day | CUTANEOUS | Status: DC
  Administered 2017-05-10 – 2017-06-03 (×19): 6 via TOPICAL

## 2017-05-09 MED ORDER — ACETAMINOPHEN 160 MG/5ML PO SOLN
650.0000 mg | Freq: Four times a day (QID) | ORAL | Status: DC | PRN
  Administered 2017-05-09 – 2017-05-31 (×4): 650 mg
  Filled 2017-05-09 (×4): qty 20.3

## 2017-05-09 NOTE — Progress Notes (Signed)
Pt failed SBT with PS 10/5. No pt effort and placed pt back on full support. RN aware.

## 2017-05-09 NOTE — Progress Notes (Signed)
Follow up - Trauma and Critical Care  Patient Details:    Patrick [name] is an 51 y.o. male.  Lines/tubes : Airway 7.5 mm (Active)  Secured at (cm) 23 cm 05/09/2017  7:54 AM  Measured From Lips 05/09/2017  7:54 AM  Secured Location Right 05/09/2017  7:54 AM  Secured By [employer] 05/09/2017  7:54 AM  Tube Holder Repositioned Yes 05/09/2017  7:54 AM  Cuff Pressure (cm H2O) 26 cm H2O 05/09/2017  7:54 AM  Site Condition Dry 05/09/2017  7:54 AM     PICC Triple Lumen 05/08/17 PICC Left Brachial 46 cm 2 cm (Active)  Indication for Insertion or Continuance of Line Prolonged intravenous therapies 05/09/2017  7:52 AM  Exposed Catheter (cm) 2 cm 05/08/2017 11:26 AM  Site Assessment Clean;Dry;Intact 05/08/2017  8:00 PM  Lumen #1 Status Flushed;Saline locked;Blood return noted 05/08/2017  8:00 PM  Lumen #2 Status Infusing 05/08/2017  8:00 PM  Lumen #3 Status Infusing 05/08/2017  8:00 PM  Dressing Type Transparent;Securing device 05/08/2017  8:00 PM  Dressing Status Clean;Dry;Intact;Antimicrobial disc in place 05/08/2017  8:00 PM  Dressing Change Due 05/15/17 05/08/2017  8:00 PM     NG/OG Tube Orogastric 18 Fr. Left mouth Xray (Active)  Site Assessment Clean;Dry;Intact 05/08/2017  8:00 PM  Ongoing Placement Verification No change in respiratory status 05/08/2017  8:00 PM  Status Suction-low intermittent 05/08/2017  8:00 PM  Drainage Appearance Bloody 05/08/2017  8:00 PM  Output (mL) 550 mL 05/09/2017  6:15 AM     Urethral Catheter Benjie Karvonen NT Latex 16 Fr. (Active)  Indication for Insertion or Continuance of Catheter Unstable critical patients (first 24-48 hours) 05/09/2017  7:52 AM  Site Assessment Clean;Dry 05/08/2017  8:00 PM  Catheter Maintenance Bag below level of bladder;Catheter secured;Drainage bag/tubing not touching floor;Insertion date on drainage bag;No dependent loops;Seal intact 05/09/2017  8:00 AM  Collection Container Standard drainage bag 05/08/2017  8:00  PM  Securement Method Securing device (Describe) 05/08/2017  8:00 PM  Urinary Catheter Interventions Unclamped 05/08/2017  8:00 PM  Output (mL) 150 mL 05/09/2017  8:00 AM    Microbiology/Sepsis markers: No results found for this or any previous visit.  Anti-infectives:  Anti-infectives (From admission, onward)   Start     Dose/Rate Route Frequency Ordered Stop   05/08/17 2300  ceFAZolin (ANCEF) IVPB 1 g/50 mL premix     1 g 100 mL/hr over 30 Minutes Intravenous Every 8 hours 05/08/17 1548 05/09/17 2259   05/08/17 1403  vancomycin (VANCOCIN) powder  Status:  Discontinued       As needed 05/08/17 1403 05/08/17 1521      Best Practice/Protocols:  VTE Prophylaxis: Mechanical Continous Sedation  Consults: Treatment Team:  Tia Alert, MD Haddix, Gillie Manners, MD    Events:  Subjective:    Overnight Issues: Did not wean well this AM.  Has not had repeat head CT  Objective:  Vital signs for last 24 hours: Temp:  [96.8 F (36 C)-98.6 F (37 C)] 98.2 F (36.8 C) (12/12 0900) Pulse Rate:  [55-82] 56 (12/12 0900) Resp:  [17-32] 26 (12/12 0900) BP: (98-135)/(52-70) 124/64 (12/12 0900) SpO2:  [97 %-100 %] 98 % (12/12 0900) FiO2 (%):  [40 %] 40 % (12/12 0754)  Hemodynamic parameters for last 24 hours:    Intake/Output from previous day: 12/11 0701 - 12/12 0700 In: 3786.5 [I.V.:3736.5; IV Piggyback:50] Out: 1900 [Urine:1310; Emesis/NG output:550; Blood:40]  Intake/Output this shift: Total I/O In: 233 [I.V.:233] Out: 150 [  Urine:150]  Vent settings for last 24 hours: Vent Mode: PRVC FiO2 (%):  [40 %] 40 % Set Rate:  [26 bmp] 26 bmp Vt Set:  [550 mL] 550 mL PEEP:  [5 cmH20] 5 cmH20 Plateau Pressure:  [19 cmH20-23 cmH20] 20 cmH20  Physical Exam:  General: alert, no respiratory distress and will follow commands on the ventilator Neuro: alert, nonfocal exam, confused and intermittently agitated HEENT/Neck: no JVD and Lots of periorbital and facial swelling Resp:  wheezes bilaterally CVS: regular rate and rhythm, S1, S2 normal, no murmur, click, rub or gallop and Slightly bradycardic in the 50's GI: Soft with good bowel sounds Extremities: S/P ORIF of RUE forearm fracture  Results for orders placed or performed during the hospital encounter of 05/08/17 (from the past 24 hour(s))  Lactic acid, plasma     Status: None   Collection Time: 05/08/17 12:15 PM  Result Value Ref Range   Lactic Acid, Venous 0.9 0.5 - 1.9 mmol/L  Blood gas, arterial     Status: Abnormal   Collection Time: 05/09/17  3:45 AM  Result Value Ref Range   FIO2 40.00    Delivery systems VENTILATOR    Mode PRESSURE REGULATED VOLUME CONTROL    VT 550 mL   LHR 26 resp/min   Peep/cpap 5.0 cm H20   pH, Arterial 7.427 7.350 - 7.450   pCO2 arterial 36.3 32.0 - 48.0 mmHg   pO2, Arterial 78.7 (L) 83.0 - 108.0 mmHg   Bicarbonate 23.5 20.0 - 28.0 mmol/L   Acid-base deficit 0.3 0.0 - 2.0 mmol/L   O2 Saturation 95.9 %   Patient temperature 98.6    Collection site LEFT RADIAL    Drawn by [id number]    Sample type ARTERIAL    Allens test (pass/fail) PASS PASS  CBC     Status: Abnormal   Collection Time: 05/09/17  5:00 AM  Result Value Ref Range   WBC 9.0 4.0 - 10.5 K/uL   RBC 3.28 (L) 4.22 - 5.81 MIL/uL   Hemoglobin 9.5 (L) 13.0 - 17.0 g/dL   HCT 04.528.5 (L) 40.939.0 - 81.152.0 %   MCV 86.9 78.0 - 100.0 fL   MCH 29.0 26.0 - 34.0 pg   MCHC 33.3 30.0 - 36.0 g/dL   RDW 91.413.4 78.211.5 - 95.615.5 %   Platelets 337 150 - 400 K/uL  Basic metabolic panel     Status: Abnormal   Collection Time: 05/09/17  5:00 AM  Result Value Ref Range   Sodium 138 135 - 145 mmol/L   Potassium 3.1 (L) 3.5 - 5.1 mmol/L   Chloride 108 101 - 111 mmol/L   CO2 21 (L) 22 - 32 mmol/L   Glucose, Bld 110 (H) 65 - 99 mg/dL   BUN 18 6 - 20 mg/dL   Creatinine, Ser 2.130.77 0.61 - 1.24 mg/dL   Calcium 7.5 (L) 8.9 - 10.3 mg/dL   GFR calc non Af Amer >60 >60 mL/min   GFR calc Af Amer >60 >60 mL/min   Anion gap 9 5 - 15  Protime-INR      Status: Abnormal   Collection Time: 05/09/17  5:00 AM  Result Value Ref Range   Prothrombin Time 15.6 (H) 11.4 - 15.2 seconds   INR 1.25      Assessment/Plan:   NEURO  Altered Mental Status:  agitation and sedation   Plan: Was too sedated to wean earlier, now following commands.  PULM  Wheezes.  Patient is a smoker.  CXR clear  Plan: DuoNeb treatments  CARDIO  Bradycardia (sinus)   Plan: No specific treatment needed.  RENAL  U rine output and renal function are good.   Plan: CPM  GI  No specific GI problems.   Plan: If not extubated today, will consider starting tube feedings.  ID  No known infectious sources   Plan: CPM  HEME  Anemia acute blood loss anemia)   Plan: No need for transfusion with Hgb 9.5  ENDO No specific issues   Plan: CPM  Global Issues  Patient never got his repeat CT head and that will be done promptly.  He is more awake now and should be able to wean for extubation later today.    LOS: 1 day   Additional comments:I reviewed the patient's new clinical lab test results. cbc/bmet, I reviewed the patients new imaging test results. cxr and I have discussed and reviewed with family members patient's wife  Critical Care Total Time*: 30 Minutes  Jimmye NormanJames Vivian Neuwirth 05/09/2017  *Care during the described time interval was provided by me and/or other providers on the critical care team.  I have reviewed this patient's available data, including medical history, events of note, physical examination and test results as part of my evaluation.
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
## 2017-05-09 NOTE — Progress Notes (Signed)
Pt weaned for 3 hours on PS 5/5. Placed pt back on full support d/t increased agitation and decreased sats. RN aware.

## 2017-05-10 LAB — MAGNESIUM
MAGNESIUM: 1.9 mg/dL (ref 1.7–2.4)
Magnesium: 2 mg/dL (ref 1.7–2.4)

## 2017-05-10 LAB — CBC WITH DIFFERENTIAL/PLATELET
Basophils Absolute: 0 10*3/uL (ref 0.0–0.1)
Basophils Relative: 0 %
EOS ABS: 0 10*3/uL (ref 0.0–0.7)
EOS PCT: 0 %
HCT: 27.7 % — ABNORMAL LOW (ref 39.0–52.0)
Hemoglobin: 9.2 g/dL — ABNORMAL LOW (ref 13.0–17.0)
LYMPHS ABS: 1.6 10*3/uL (ref 0.7–4.0)
LYMPHS PCT: 12 %
MCH: 28.9 pg (ref 26.0–34.0)
MCHC: 33.2 g/dL (ref 30.0–36.0)
MCV: 87.1 fL (ref 78.0–100.0)
MONO ABS: 1.3 10*3/uL — AB (ref 0.1–1.0)
MONOS PCT: 10 %
Neutro Abs: 10.1 10*3/uL — ABNORMAL HIGH (ref 1.7–7.7)
Neutrophils Relative %: 78 %
PLATELETS: 326 10*3/uL (ref 150–400)
RBC: 3.18 MIL/uL — ABNORMAL LOW (ref 4.22–5.81)
RDW: 13.2 % (ref 11.5–15.5)
WBC: 13.1 10*3/uL — AB (ref 4.0–10.5)

## 2017-05-10 LAB — BASIC METABOLIC PANEL
Anion gap: 8 (ref 5–15)
BUN: 16 mg/dL (ref 6–20)
CO2: 25 mmol/L (ref 22–32)
CREATININE: 0.75 mg/dL (ref 0.61–1.24)
Calcium: 8 mg/dL — ABNORMAL LOW (ref 8.9–10.3)
Chloride: 109 mmol/L (ref 101–111)
GFR calc Af Amer: >60 mL/min (ref >60)
Glucose, Bld: 91 mg/dL (ref 65–99)
POTASSIUM: 3.1 mmol/L — AB (ref 3.5–5.1)
SODIUM: 142 mmol/L (ref 135–145)

## 2017-05-10 LAB — GLUCOSE, CAPILLARY
GLUCOSE-CAPILLARY: 126 mg/dL — AB (ref 65–99)
GLUCOSE-CAPILLARY: 128 mg/dL — AB (ref 65–99)
Glucose-Capillary: 141 mg/dL — ABNORMAL HIGH (ref 65–99)

## 2017-05-10 LAB — PHOSPHORUS: Phosphorus: 1.1 mg/dL — ABNORMAL LOW (ref 2.5–4.6)

## 2017-05-10 MED ORDER — POTASSIUM CHLORIDE 20 MEQ/15ML (10%) PO SOLN
40.0000 meq | Freq: Two times a day (BID) | ORAL | Status: AC
  Administered 2017-05-10 (×2): 40 meq
  Filled 2017-05-10 (×2): qty 30

## 2017-05-10 MED ORDER — PIVOT 1.5 CAL PO LIQD
1000.0000 mL | ORAL | Status: DC
  Administered 2017-05-10 – 2017-05-24 (×14): 1000 mL
  Filled 2017-05-10 (×6): qty 1000

## 2017-05-10 MED ORDER — VITAL HIGH PROTEIN PO LIQD
1000.0000 mL | ORAL | Status: DC
  Filled 2017-05-10: qty 1000

## 2017-05-10 MED ORDER — POTASSIUM PHOSPHATES 15 MMOLE/5ML IV SOLN
30.0000 mmol | Freq: Once | INTRAVENOUS | Status: AC
  Administered 2017-05-10: 30 mmol via INTRAVENOUS
  Filled 2017-05-10: qty 10

## 2017-05-10 MED ORDER — PRO-STAT SUGAR FREE PO LIQD
30.0000 mL | Freq: Two times a day (BID) | ORAL | Status: DC
  Administered 2017-05-10: 30 mL
  Filled 2017-05-10: qty 30

## 2017-05-10 MED ORDER — ADULT MULTIVITAMIN LIQUID CH
15.0000 mL | Freq: Every day | ORAL | Status: DC
  Administered 2017-05-10 – 2017-05-24 (×14): 15 mL
  Filled 2017-05-10 (×14): qty 15

## 2017-05-10 MED ORDER — PRO-STAT SUGAR FREE PO LIQD
30.0000 mL | Freq: Three times a day (TID) | ORAL | Status: DC
  Administered 2017-05-10 – 2017-05-17 (×19): 30 mL
  Filled 2017-05-10 (×19): qty 30

## 2017-05-10 NOTE — Progress Notes (Signed)
Follow up - Trauma Critical Care  Patient Details:    Patrick [name] [place] is an 51 y.o. male.  Lines/tubes : Airway 7.5 mm (Active)  Secured at (cm) 23 cm 05/10/2017  4:01 AM  Measured From Lips 05/10/2017  4:01 AM  Secured Location Center 05/10/2017  4:01 AM  Secured By [employer] Tube Holder 05/10/2017  4:01 AM  Tube Holder Repositioned Yes 05/10/2017  4:01 AM  Cuff Pressure (cm H2O) 28 cm H2O 05/09/2017  7:57 PM  Site Condition Dry 05/10/2017  4:01 AM     PICC Triple Lumen 05/08/17 PICC Left Brachial 46 cm 2 cm (Active)  Indication for Insertion or Continuance of Line Prolonged intravenous therapies 05/09/2017  8:00 PM  Exposed Catheter (cm) 2 cm 05/08/2017 11:26 AM  Site Assessment Clean;Dry;Intact 05/09/2017  8:00 PM  Lumen #1 Status In-line blood sampling system in place 05/09/2017  8:00 PM  Lumen #2 Status Blood return noted;Flushed;Saline locked 05/09/2017  8:53 PM  Lumen #3 Status Infusing 05/09/2017  8:00 PM  Dressing Type Transparent;Securing device 05/09/2017  8:00 PM  Dressing Status Clean;Dry;Intact;Antimicrobial disc in place 05/09/2017  8:00 PM  Line Care Lumen 3 tubing changed 05/10/2017  1:30 AM  Dressing Change Due 05/15/17 05/09/2017  8:00 PM     NG/OG Tube Orogastric 18 Fr. Left mouth Xray (Active)  Site Assessment Clean;Dry;Intact 05/09/2017  8:00 PM  Ongoing Placement Verification No change in respiratory status 05/09/2017  8:00 PM  Status Suction-low intermittent 05/09/2017  8:00 PM  Drainage Appearance Bloody 05/09/2017  8:00 PM  Output (mL) 275 mL 05/10/2017  6:00 AM     External Urinary Catheter (Active)    Microbiology/Sepsis markers: No results found for this or any previous visit.  Anti-infectives:  Anti-infectives (From admission, onward)   Start     Dose/Rate Route Frequency Ordered Stop   05/08/17 2300  ceFAZolin (ANCEF) IVPB 1 g/50 mL premix     1 g 100 mL/hr over 30 Minutes Intravenous Every 8 hours 05/08/17 1548 05/09/17 1517   05/08/17 1403  vancomycin (VANCOCIN) powder  Status:  Discontinued       As needed 05/08/17 1403 05/08/17 1521      Best Practice/Protocols:  VTE Prophylaxis: Mechanical Continous Sedation  Consults: Treatment Team:  Tia AlertJones, David S, MD Haddix, Gillie MannersKevin P, MD    Studies:    Events:  Subjective:    Overnight Issues:   Objective:  Vital signs for last 24 hours: Temp:  [98.2 F (36.8 C)-101.7 F (38.7 C)] 99.7 F (37.6 C) (12/13 0600) Pulse Rate:  [36-78] 66 (12/13 0700) Resp:  [16-26] 26 (12/13 0700) BP: (116-164)/(52-64) 116/54 (12/13 0700) SpO2:  [79 %-100 %] 95 % (12/13 0700) FiO2 (%):  [40 %-60 %] 40 % (12/13 0401)  Hemodynamic parameters for last 24 hours:    Intake/Output from previous day: 12/12 0701 - 12/13 0700 In: 2542.9 [I.V.:2542.9] Out: 1300 [Urine:800; Emesis/NG output:500]  Intake/Output this shift: No intake/output data recorded.  Vent settings for last 24 hours: Vent Mode: PRVC FiO2 (%):  [40 %-60 %] 40 % Set Rate:  [26 bmp] 26 bmp Vt Set:  [550 mL] 550 mL PEEP:  [5 cmH20] 5 cmH20 Pressure Support:  [5 cmH20] 5 cmH20 Plateau Pressure:  [19 cmH20-23 cmH20] 19 cmH20  Physical Exam:  General: on vent Neuro: pupils small, moves BLE and L hand spont but did not F/C for me HEENT/Neck: ETT Resp: clear to auscultation bilaterally CVS: RRR GI: soft, nontender, BS WNL, no r/g Extremities:  mild edema R knee  Results for orders placed or performed during the hospital encounter of 05/08/17 (from the past 24 hour(s))  CBC with Differential/Platelet     Status: Abnormal   Collection Time: 05/10/17  5:00 AM  Result Value Ref Range   WBC 13.1 (H) 4.0 - 10.5 K/uL   RBC 3.18 (L) 4.22 - 5.81 MIL/uL   Hemoglobin 9.2 (L) 13.0 - 17.0 g/dL   HCT 16.127.7 (L) 09.639.0 - 04.552.0 %   MCV 87.1 78.0 - 100.0 fL   MCH 28.9 26.0 - 34.0 pg   MCHC 33.2 30.0 - 36.0 g/dL   RDW 40.913.2 81.111.5 - 91.415.5 %   Platelets 326 150 - 400 K/uL   Neutrophils Relative % 78 %   Neutro Abs 10.1  (H) 1.7 - 7.7 K/uL   Lymphocytes Relative 12 %   Lymphs Abs 1.6 0.7 - 4.0 K/uL   Monocytes Relative 10 %   Monocytes Absolute 1.3 (H) 0.1 - 1.0 K/uL   Eosinophils Relative 0 %   Eosinophils Absolute 0.0 0.0 - 0.7 K/uL   Basophils Relative 0 %   Basophils Absolute 0.0 0.0 - 0.1 K/uL  Basic metabolic panel     Status: Abnormal   Collection Time: 05/10/17  5:00 AM  Result Value Ref Range   Sodium 142 135 - 145 mmol/L   Potassium 3.1 (L) 3.5 - 5.1 mmol/L   Chloride 109 101 - 111 mmol/L   CO2 25 22 - 32 mmol/L   Glucose, Bld 91 65 - 99 mg/dL   BUN 16 6 - 20 mg/dL   Creatinine, Ser 7.820.75 0.61 - 1.24 mg/dL   Calcium 8.0 (L) 8.9 - 10.3 mg/dL   GFR calc non Af Amer >60 >60 mL/min   GFR calc Af Amer >60 >60 mL/min   Anion gap 8 5 - 15    Assessment & Plan: Present on Admission: . Depressed skull fracture (HCC)    LOS: 2 days   Additional comments:I reviewed the patient's new clinical lab test results. and CT [facility]/depressed frontal bone FX - seems to be improving, CT H 12/12 expected edema around frontal ICCs B orbit/ethmoid/crib plate FXs - Dr. Jenne PaneBates and Dr. [name] following R BB FA FX - S/P ORIF by Dr. 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 I spoke with his sister at the bedside. Critical Care Total Time*: 34 Minutes  Violeta GelinasBurke Shareen Capwell, MD, MPH, [facility] Trauma: [phone] General Surgery: [phone]  05/10/2017  *Care during the described time interval was provided by me. I have reviewed this patient's available data, including medical history, events of note, physical examination and test results as part of my evaluation.  Patient ID: Patrick [name] [place]rell, male   DOB: 17-Oct-1965, 51 y.o.   MRN: [id number]

## 2017-05-10 NOTE — Progress Notes (Signed)
Patient ID: Patrick [name], male   DOB: 1966-05-26, 51 y.o.   MRN: [id number] Subjective: Patient remains sedated and intubated  Objective: Vital signs in last 24 hours: Temp:  [98.1 F (36.7 C)-101.7 F (38.7 C)] 99.7 F (37.6 C) (12/13 0600) Pulse Rate:  [36-78] 66 (12/13 0700) Resp:  [16-26] 26 (12/13 0700) BP: (116-164)/(52-64) 116/54 (12/13 0700) SpO2:  [79 %-100 %] 95 % (12/13 0700) FiO2 (%):  [40 %-60 %] 40 % (12/13 0401)  Intake/Output from previous day: 12/12 0701 - 12/13 0700 In: 2542.9 [I.V.:2542.9] Out: 1300 [Urine:800; Emesis/NG output:500] Intake/Output this shift: No intake/output data recorded.  Remains purposeful with mvmts  Lab Results: Lab Results  Component Value Date   WBC 13.1 (H) 05/10/2017   HGB 9.2 (L) 05/10/2017   HCT 27.7 (L) 05/10/2017   MCV 87.1 05/10/2017   PLT 326 05/10/2017   Lab Results  Component Value Date   INR 1.25 05/09/2017   BMET Lab Results  Component Value Date   NA 142 05/10/2017   K 3.1 (L) 05/10/2017   CL 109 05/10/2017   CO2 25 05/10/2017   GLUCOSE 91 05/10/2017   BUN 16 05/10/2017   CREATININE 0.75 05/10/2017   CALCIUM 8.0 (L) 05/10/2017    Studies/Results: Dg Forearm Right  Result Date: 05/08/2017 CLINICAL DATA:  51 year old male with fracture post reduction. Subsequent encounter. EXAM: RIGHT FOREARM - 2 VIEW COMPARISON:  Intraoperative C-arm views 05/08/2017. Preoperative plain film exam 05/08/2017. FINDINGS: Open reduction internal fixation with sideplate and screws transfixing proximal 1/3 right radial and ulnar fracture. Significantly better alignment of fracture fragments. Proximal right radial fracture has a butterfly fragment which is only minimally separated from major fracture fragments. Small oblique spiral type fracture of the distal right ulnar. Right elbow appears grossly intact (lateral view slightly rotated). Carpal bones appear intact. IMPRESSION: Open reduction and internal fixation of proximal  right ulna and radius fracture. Small oblique spiral type fracture of the distal right ulnar. These results will be called to the ordering clinician or representative by the Radiologist Assistant, and communication documented in the PACS or zVision Dashboard. Electronically Signed   By: Lacy DuverneySteven  Olson M.D.   On: 05/08/2017 16:22   Dg Forearm Right  Result Date: 05/08/2017 CLINICAL DATA:  51 year old male with right ulna and radius fracture. Subsequent encounter. EXAM: DG C-ARM 61-120 MIN; RIGHT FOREARM - 2 VIEW COMPARISON:  05/08/2017. Fluoroscopic time: 40 seconds FINDINGS: Four intraoperative views submitted for review after surgery. First 2 films reveal fracture of the proximal 1/3 of the right radius and ulna with slightly better alignment than on the prior exam. Second 2 films reveal plate and screws transfixing fracture sites with better alignment. Elbow and wrist joint not evaluated. IMPRESSION: Open reduction and internal fixation of right radius and ulna fracture. Electronically Signed   By: Lacy DuverneySteven  Olson M.D.   On: 05/08/2017 15:32   Dg Knee 1-2 Views Right  Result Date: 05/08/2017 CLINICAL DATA:  51 year old male with trauma. Evaluating for anterior cruciate ligament tear. Initial encounter. EXAM: RIGHT KNEE - 1-2 VIEW COMPARISON:  01/18/2005. FINDINGS: Single AP C-arm view reveals widening of the lateral tibiofemoral joint space and narrowing of the medial tibiofemoral joint space with pressure applied from the medial position. No obvious fracture noted although evaluation limited. IMPRESSION: Single AP C-arm view reveals widening of the lateral tibiofemoral joint space and narrowing of the medial tibiofemoral joint space with pressure applied from the medial position. Electronically Signed   By: Lacy DuverneySteven  Olson  M.D.   On: 05/08/2017 15:36   Ct Head Wo Contrast  Result Date: 05/09/2017 CLINICAL DATA:  Follow-up head trauma. EXAM: CT HEAD WITHOUT CONTRAST TECHNIQUE: Contiguous axial images  were obtained from the base of the skull through the vertex without intravenous contrast. COMPARISON:  Yesterday FINDINGS: Brain: Bilateral inferior and right anterior frontal hemorrhagic contusions with progressive swelling but no progressive hemorrhage. The hemorrhage is patchy and cannot be accurately quantified. Trace subdural hemorrhage along the posterior falx and tentorium. Small volume subarachnoid hemorrhage along the convexities. Pneumocephalus is resolved. No hydrocephalus or herniation. No generalized effacement of sulci. Vascular: No acute finding Skull: Right frontal bone fracture with 4 mm of depression and stable distraction. Anterior skullbase fracturing crossing the left orbital apex. Sinuses/Orbits: Diffuse bilateral paranasal sinus opacification, fractures traverse bilateral ethmoid sinuses. Mild stranding and extraconal right orbit. No proptosis. Progressive facial swelling. Bilateral naso-orbital ethmoid fractures with prior face CT obtained. Right anterior zygomatic arch fracture. IMPRESSION: 1. Bifrontal hemorrhagic contusion with expected progressive swelling. No progressive hemorrhage. 2. Trace falcine subdural hemorrhage and small volume subarachnoid hemorrhage. 3. Known distracted and depressed right frontal bone fracture. 4. Facial fractures as characterized on prior dedicated CT. Resolved pneumocephalus. Electronically Signed   By: Marnee SpringJonathon  Watts M.D.   On: 05/09/2017 11:11   Dg Chest Port 1 View  Result Date: 05/09/2017 CLINICAL DATA:  Hypoxia EXAM: PORTABLE CHEST 1 VIEW COMPARISON:  Chest radiograph and chest CT May 08, 2017 FINDINGS: Endotracheal tube tip is 9.5 cm above the carina. Nasogastric tube tip and side port are below the diaphragm. Central catheter tip is in the superior vena cava. No pneumothorax. There is no appreciable edema or consolidation. Heart size and pulmonary vascularity are within normal limits. No evident adenopathy. No evident bone lesions.  IMPRESSION: Tube and catheter positions as described without evident pneumothorax. No edema or consolidation. Stable cardiac silhouette. Electronically Signed   By: Bretta BangWilliam  Woodruff III M.D.   On: 05/09/2017 07:19   Dg C-arm 61-120 Min  Result Date: 05/08/2017 CLINICAL DATA:  51 year old male with right ulna and radius fracture. Subsequent encounter. EXAM: DG C-ARM 61-120 MIN; RIGHT FOREARM - 2 VIEW COMPARISON:  05/08/2017. Fluoroscopic time: 40 seconds FINDINGS: Four intraoperative views submitted for review after surgery. First 2 films reveal fracture of the proximal 1/3 of the right radius and ulna with slightly better alignment than on the prior exam. Second 2 films reveal plate and screws transfixing fracture sites with better alignment. Elbow and wrist joint not evaluated. IMPRESSION: Open reduction and internal fixation of right radius and ulna fracture. Electronically Signed   By: Lacy DuverneySteven  Olson M.D.   On: 05/08/2017 15:32    Assessment/Plan: CCM, no new recs, talked to wife at length and answered questions as best I could   LOS: 2 days    JONES,DAVID S 05/10/2017, 7:45 AM

## 2017-05-10 NOTE — Anesthesia Postprocedure Evaluation (Signed)
Anesthesia Post Note  Patient: Patrick [name]  Procedure(s) Performed: OPEN REDUCTION INTERNAL FIXATION (ORIF) BOTH BONE ARM FRACTURES (Right Arm Lower)     Patient location during evaluation: PACU Anesthesia Type: General Level of consciousness: awake and alert Pain management: pain level controlled Vital Signs Assessment: post-procedure vital signs reviewed and stable Respiratory status: spontaneous breathing, nonlabored ventilation, respiratory function stable and patient connected to nasal cannula oxygen Cardiovascular status: blood pressure returned to baseline and stable Postop Assessment: no apparent nausea or vomiting Anesthetic complications: no    Last Vitals:  Vitals:   05/10/17 0700 05/10/17 0948  BP: (!) 116/54 (!) 117/58  Pulse: 66   Resp: (!) 26   Temp:    SpO2: 95%     Last Pain:  Vitals:   05/08/17 2000  TempSrc: Core                 Alexandros Ewan,JAMES TERRILL
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
## 2017-05-10 NOTE — Progress Notes (Signed)
Orthopaedic Trauma Progress Note  S: Still remains intubated  O:  Vitals:   05/10/17 0700 05/10/17 0948  BP: (!) 116/54 (!) 117/58  Pulse: 66   Resp: (!) 26   Temp:    SpO2: 95%   Right upper extremity reveals incisions clean dry and intact.  Compartments are soft and compressible.  Cannot follow motor or sensory exam  Labs:  Hemoglobin & Hematocrit     Component Value Date/Time   HGB 9.2 (L) 05/10/2017 0500   HCT 27.7 (L) 05/10/2017 [id number]   A/P: 51 year old male with right closed both bone forearm fracture status post ORIF along with ACL, LCL right knee injury.  Nonweightbearing right upper extremity.  No range of motion restrictions Right knee injury may be an old injury, we will right now and evaluated once he is able to participate in exam. WBAT RLE Dressing change PRN  Roby LoftsKevin P. Charnelle Bergeman, MD [facility] [phone] (phone)

## 2017-05-10 NOTE — Progress Notes (Signed)
Paged Trauma MD - critical lab: phosphorous <1. Awaiting call back.

## 2017-05-10 NOTE — Progress Notes (Signed)
Pharmacist will call Dr. Luisa Hartornett re: dosage of k phos.

## 2017-05-10 NOTE — Plan of Care (Signed)
Tube feedings started

## 2017-05-11 ENCOUNTER — Other Ambulatory Visit: Payer: Self-pay

## 2017-05-11 ENCOUNTER — Inpatient Hospital Stay (HOSPITAL_COMMUNITY): Payer: No Typology Code available for payment source

## 2017-05-11 LAB — GLUCOSE, CAPILLARY
GLUCOSE-CAPILLARY: 163 mg/dL — AB (ref 65–99)
GLUCOSE-CAPILLARY: 106 mg/dL — AB (ref 65–99)
Glucose-Capillary: 118 mg/dL — ABNORMAL HIGH (ref 65–99)
Glucose-Capillary: 130 mg/dL — ABNORMAL HIGH (ref 65–99)
Glucose-Capillary: 136 mg/dL — ABNORMAL HIGH (ref 65–99)
Glucose-Capillary: 138 mg/dL — ABNORMAL HIGH (ref 65–99)

## 2017-05-11 LAB — BASIC METABOLIC PANEL
ANION GAP: 5 (ref 5–15)
BUN: 19 mg/dL (ref 6–20)
CHLORIDE: 112 mmol/L — AB (ref 101–111)
CO2: 26 mmol/L (ref 22–32)
Calcium: 8 mg/dL — ABNORMAL LOW (ref 8.9–10.3)
Creatinine, Ser: 0.73 mg/dL (ref 0.61–1.24)
Glucose, Bld: 147 mg/dL — ABNORMAL HIGH (ref 65–99)
POTASSIUM: 3.6 mmol/L (ref 3.5–5.1)
SODIUM: 143 mmol/L (ref 135–145)

## 2017-05-11 LAB — TRIGLYCERIDES: TRIGLYCERIDES: 86 mg/dL (ref <150)

## 2017-05-11 LAB — CBC
HCT: 26.8 % — ABNORMAL LOW (ref 39.0–52.0)
HEMOGLOBIN: 8.7 g/dL — AB (ref 13.0–17.0)
MCH: 28.4 pg (ref 26.0–34.0)
MCHC: 32.5 g/dL (ref 30.0–36.0)
MCV: 87.6 fL (ref 78.0–100.0)
Platelets: 311 10*3/uL (ref 150–400)
RBC: 3.06 MIL/uL — AB (ref 4.22–5.81)
RDW: 13.4 % (ref 11.5–15.5)
WBC: 13.2 10*3/uL — AB (ref 4.0–10.5)

## 2017-05-11 LAB — MAGNESIUM
MAGNESIUM: 2.1 mg/dL (ref 1.7–2.4)
MAGNESIUM: 2 mg/dL (ref 1.7–2.4)

## 2017-05-11 LAB — PHOSPHORUS
PHOSPHORUS: 1.7 mg/dL — AB (ref 2.5–4.6)
PHOSPHORUS: 1.3 mg/dL — AB (ref 2.5–4.6)

## 2017-05-11 MED ORDER — PIPERACILLIN-TAZOBACTAM 3.375 G IVPB
3.3750 g | Freq: Three times a day (TID) | INTRAVENOUS | Status: DC
  Administered 2017-05-11 – 2017-05-15 (×12): 3.375 g via INTRAVENOUS
  Filled 2017-05-11 (×13): qty 50

## 2017-05-11 MED ORDER — VANCOMYCIN HCL IN DEXTROSE 1-5 GM/200ML-% IV SOLN
1000.0000 mg | Freq: Three times a day (TID) | INTRAVENOUS | Status: DC
  Administered 2017-05-11 – 2017-05-13 (×6): 1000 mg via INTRAVENOUS
  Filled 2017-05-11 (×7): qty 200

## 2017-05-11 NOTE — Progress Notes (Signed)
When attempting to wean patient his HR dropped to 40. Patient was placed back on full support where HR came up to 60. RN at bedside. Patients vitals currently stable.

## 2017-05-11 NOTE — Progress Notes (Signed)
RT called to bedside because pt had a ETT cuff leak. Pts tube was at 21 at the lips and is supposed to be at 23 at the lips. Advanced back to 23 and pt receiving correct VT. RT to cont to monitor.
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
## 2017-05-11 NOTE — Progress Notes (Addendum)
   Subjective:    Patient ID: Patrick [name], male    DOB: April 22, 1966, 51 y.o.   MRN: [id number]  HPI Continues on ventilator.  Antibiotic being started for pneumonia.  Review of Systems     Objective:   Physical Exam Tm 102.3.  VSS Sedated, intubated. Bilateral periorbital ecchymosis and edema.  Pupils small and equal. Right forehead with vertical bony stepoff.  Nasal bones with creptiance.  Some improvement in edema but still fairly edematous.     Assessment & Plan:  Right frontal bone fracture, NOE fracture, nasal fracture, orbital fractures.  Edema improving.  Will discuss frontal fracture with neurosurgery.  Fracture reduction and stabilization, if pursued, would probably best be done through a sea-gull brow incision given his far-receded hairline.  Timing would be next week to allow edema to improve further.  Will need to discuss with his next of kin.  Reviewed thoughts with his nephew, who is at the bedside.

## 2017-05-11 NOTE — Plan of Care (Signed)
Tube feeding started 05/10/17

## 2017-05-11 NOTE — Progress Notes (Signed)
Follow up - Trauma and Critical Care  Patient Details:    Patrick [name] is an 51 y.o. male.  Lines/tubes : Airway 7.5 mm (Active)  Secured at (cm) 23 cm 05/11/2017  8:00 AM  Measured From Lips 05/11/2017  8:00 AM  Secured Location Center 05/11/2017  7:52 AM  Secured By [employer] Tube Holder 05/11/2017  8:00 AM  Tube Holder Repositioned Yes 05/11/2017  7:52 AM  Cuff Pressure (cm H2O) 28 cm H2O 05/10/2017  8:02 PM  Site Condition Dry 05/11/2017  8:00 AM     PICC Triple Lumen 05/08/17 PICC Left Brachial 46 cm 2 cm (Active)  Indication for Insertion or Continuance of Line Prolonged intravenous therapies 05/11/2017  8:00 AM  Exposed Catheter (cm) 2 cm 05/11/2017  8:00 AM  Site Assessment Clean;Dry;Intact 05/11/2017  8:00 AM  Lumen #1 Status In-line blood sampling system in place 05/11/2017  8:00 AM  Lumen #2 Status Infusing 05/11/2017  8:00 AM  Lumen #3 Status Infusing 05/11/2017  8:00 AM  Dressing Type Transparent;Securing device 05/11/2017  8:00 AM  Dressing Status Clean;Dry;Intact;Antimicrobial disc in place 05/11/2017  8:00 AM  Line Care Connections checked and tightened 05/11/2017  8:00 AM  Dressing Change Due 05/15/17 05/11/2017  8:00 AM     NG/OG Tube Orogastric 18 Fr. Left mouth Xray (Active)  Site Assessment Clean;Dry;Intact 05/11/2017  8:00 AM  Ongoing Placement Verification No change in respiratory status;No acute changes, not attributed to clinical condition 05/11/2017  8:00 AM  Status Infusing tube feed 05/11/2017  8:00 AM  Drainage Appearance Bloody 05/10/2017  8:00 AM  Output (mL) 275 mL 05/10/2017  6:00 AM     External Urinary Catheter (Active)  Collection Container Standard drainage bag 05/11/2017  8:00 AM  Securement Method Securing device (Describe) 05/11/2017  8:00 AM  Output (mL) 125 mL 05/11/2017  8:00 AM    Microbiology/Sepsis markers: No results found for this or any previous visit.  Anti-infectives:  Anti-infectives (From admission, onward)   Start     Dose/Rate Route Frequency Ordered Stop   05/08/17 2300  ceFAZolin (ANCEF) IVPB 1 g/50 mL premix     1 g 100 mL/hr over 30 Minutes Intravenous Every 8 hours 05/08/17 1548 05/09/17 1517   05/08/17 1403  vancomycin (VANCOCIN) powder  Status:  Discontinued       As needed 05/08/17 1403 05/08/17 1521      Best Practice/Protocols:  VTE Prophylaxis: Mechanical GI Prophylaxis: Proton Pump Inhibitor Continous Sedation  Consults: Treatment Team:  Tia AlertJones, David S, MD Haddix, Gillie MannersKevin P, MD    Events:  Subjective:    Overnight Issues: Developed more secretions.  Agitated intermittently.  Objective:  Vital signs for last 24 hours: Temp:  [98.2 F (36.8 C)-102.3 F (39.1 C)] 98.6 F (37 C) (12/14 0800) Pulse Rate:  [55-85] 55 (12/14 1000) Resp:  [24-26] 26 (12/14 1000) BP: (100-161)/(39-73) 143/58 (12/14 1000) SpO2:  [91 %-100 %] 99 % (12/14 1000) FiO2 (%):  [40 %-50 %] 40 % (12/14 0752) Weight:  [82.8 kg (182 lb 8.7 oz)-83.2 kg (183 lb 6.8 oz)] 83.2 kg (183 lb 6.8 oz) (12/14 0452)  Hemodynamic parameters for last 24 hours:    Intake/Output from previous day: 12/13 0701 - 12/14 0700 In: 4185.9 [I.V.:2919.9; NG/GT:756; IV Piggyback:510] Out: 650 [Urine:650]  Intake/Output this shift: Total I/O In: 351.5 [I.V.:271.5; NG/GT:80] Out: 125 [Urine:125]  Vent settings for last 24 hours: Vent Mode: PRVC FiO2 (%):  [40 %-50 %] 40 % Set Rate:  [26 bmp]  26 bmp Vt Set:  [550 mL-580 mL] 580 mL PEEP:  [5 cmH20] 5 cmH20 Plateau Pressure:  [19 cmH20-24 cmH20] 24 cmH20  Physical Exam:  General: alert, no respiratory distress and intermittently agitated. Neuro: nonfocal exam, RASS -1 and agitated HEENT/Neck: no JVD, ETT WNL  and had ETT cuff leak earlier Resp: wheezes bilaterally and CXR showing new RLL consolidation/infiltrate CVS: regular rate and rhythm, S1, S2 normal, no murmur, click, rub or gallop and developed sinus bradycardia with wean of vent today. GI: No specific  issues Extremities: no edema, no erythema, pulses WNL  Results for orders placed or performed during the hospital encounter of 05/08/17 (from the past 24 hour(s))  Glucose, capillary     Status: Abnormal   Collection Time: 05/10/17  3:58 PM  Result Value Ref Range   Glucose-Capillary 126 (H) 65 - 99 mg/dL  Magnesium     Status: None   Collection Time: 05/10/17  4:50 PM  Result Value Ref Range   Magnesium 2.0 1.7 - 2.4 mg/dL  Phosphorus     Status: Abnormal   Collection Time: 05/10/17  4:50 PM  Result Value Ref Range   Phosphorus <1.0 (LL) 2.5 - 4.6 mg/dL  Glucose, capillary     Status: Abnormal   Collection Time: 05/10/17  8:08 PM  Result Value Ref Range   Glucose-Capillary 141 (H) 65 - 99 mg/dL   Comment 1 Notify RN    Comment 2 Document in Chart   Glucose, capillary     Status: Abnormal   Collection Time: 05/10/17 11:28 PM  Result Value Ref Range   Glucose-Capillary 128 (H) 65 - 99 mg/dL   Comment 1 Notify RN    Comment 2 Document in Chart   Glucose, capillary     Status: Abnormal   Collection Time: 05/11/17  3:34 AM  Result Value Ref Range   Glucose-Capillary 138 (H) 65 - 99 mg/dL   Comment 1 Notify RN    Comment 2 Document in Chart   Triglycerides     Status: None   Collection Time: 05/11/17  4:56 AM  Result Value Ref Range   Triglycerides 86 <150 mg/dL  CBC     Status: Abnormal   Collection Time: 05/11/17  5:00 AM  Result Value Ref Range   WBC 13.2 (H) 4.0 - 10.5 K/uL   RBC 3.06 (L) 4.22 - 5.81 MIL/uL   Hemoglobin 8.7 (L) 13.0 - 17.0 g/dL   HCT 96.0 (L) 45.4 - 09.8 %   MCV 87.6 78.0 - 100.0 fL   MCH 28.4 26.0 - 34.0 pg   MCHC 32.5 30.0 - 36.0 g/dL   RDW 11.9 14.7 - 82.9 %   Platelets 311 150 - 400 K/uL  Basic metabolic panel     Status: Abnormal   Collection Time: 05/11/17  5:00 AM  Result Value Ref Range   Sodium 143 135 - 145 mmol/L   Potassium 3.6 3.5 - 5.1 mmol/L   Chloride 112 (H) 101 - 111 mmol/L   CO2 26 22 - 32 mmol/L   Glucose, Bld 147 (H) 65 -  99 mg/dL   BUN 19 6 - 20 mg/dL   Creatinine, Ser 5.62 0.61 - 1.24 mg/dL   Calcium 8.0 (L) 8.9 - 10.3 mg/dL   GFR calc non Af Amer >60 >60 mL/min   GFR calc Af Amer >60 >60 mL/min   Anion gap 5 5 - 15  Magnesium     Status: None   Collection Time:  05/11/17  5:00 AM  Result Value Ref Range   Magnesium 2.1 1.7 - 2.4 mg/dL  Phosphorus     Status: Abnormal   Collection Time: 05/11/17  5:00 AM  Result Value Ref Range   Phosphorus 1.7 (L) 2.5 - 4.6 mg/dL  Glucose, capillary     Status: Abnormal   Collection Time: 05/11/17  8:31 AM  Result Value Ref Range   Glucose-Capillary 136 (H) 65 - 99 mg/dL     Assessment/Plan:   NEURO  Altered Mental Status:  agitation, delirium and sedation   Plan: Will make appropriate adjustments as we move closer to extubation.  PULM  Atelectasis/collapse (focal and RLL) Pneumonia: hospital acquired (not ventilator-associated) cultures are pending.   Plan: Empiric antibiotics. ETT culture  CARDIO  No specific problems   Plan: CPM  RENAL  It is very difficult to manage the patient's urine output and renal function without a Foley.   Plan: Consider placing Foley catheter  GI  No specific issues   Plan: Toelrating tube feednis and will continue  ID  No known infectious source, but seems like he has purulent tracheal aspirate   Plan: Empirically started antibiotics, sent tracheal culture  HEME  Anemia acute blood loss anemia and anemia of critical illness)   Plan: No blood needed.  ENDO No specific  issues   Plan: CPM  Global Issues  Purulent tracheal aspirate with consolidation noted on CXR.  Will start on antibiotics for PNA and send culture.   Not able to wean on ventilator yet.    Spoke with Dr. Jenne PaneBates about his maxillowfacial injuries and currently not planning ofn operation.    LOS: 3 days   Additional comments:I reviewed the patient's new clinical lab test results. cbc/bmet and I reviewed the patients new imaging test results. CXR  Critical  Care Total Time*: 30 Minutes  Jimmye NormanJames Cydney Alvarenga 05/11/2017  *Care during the described time interval was provided by me and/or other providers on the critical care team.  I have reviewed this patient's available data, including medical history, events of note, physical examination and test results as part of my evaluation.
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
## 2017-05-11 NOTE — Progress Notes (Signed)
Patient ID: Patrick [name] Dean Fallen, male   DOB: [birth year], 51 y.o.   MRN: [id number] Intubated and sedated, seems stable, CT head reviewed again, following

## 2017-05-12 ENCOUNTER — Inpatient Hospital Stay (HOSPITAL_COMMUNITY): Payer: No Typology Code available for payment source

## 2017-05-12 LAB — BASIC METABOLIC PANEL
ANION GAP: 5 (ref 5–15)
BUN: 19 mg/dL (ref 6–20)
CHLORIDE: 111 mmol/L (ref 101–111)
CO2: 25 mmol/L (ref 22–32)
Calcium: 7.5 mg/dL — ABNORMAL LOW (ref 8.9–10.3)
Creatinine, Ser: 0.68 mg/dL (ref 0.61–1.24)
Glucose, Bld: 130 mg/dL — ABNORMAL HIGH (ref 65–99)
POTASSIUM: 3.1 mmol/L — AB (ref 3.5–5.1)
SODIUM: 141 mmol/L (ref 135–145)

## 2017-05-12 LAB — CBC WITH DIFFERENTIAL/PLATELET
BASOS ABS: 0 10*3/uL (ref 0.0–0.1)
BASOS PCT: 0 %
EOS ABS: 0.2 10*3/uL (ref 0.0–0.7)
Eosinophils Relative: 2 %
HCT: 25.1 % — ABNORMAL LOW (ref 39.0–52.0)
HEMOGLOBIN: 8.2 g/dL — AB (ref 13.0–17.0)
LYMPHS ABS: 2.1 10*3/uL (ref 0.7–4.0)
Lymphocytes Relative: 20 %
MCH: 28.8 pg (ref 26.0–34.0)
MCHC: 32.7 g/dL (ref 30.0–36.0)
MCV: 88.1 fL (ref 78.0–100.0)
Monocytes Absolute: 1.1 10*3/uL — ABNORMAL HIGH (ref 0.1–1.0)
Monocytes Relative: 10 %
NEUTROS PCT: 68 %
Neutro Abs: 7.3 10*3/uL (ref 1.7–7.7)
PLATELETS: 350 10*3/uL (ref 150–400)
RBC: 2.85 MIL/uL — AB (ref 4.22–5.81)
RDW: 13.7 % (ref 11.5–15.5)
WBC: 10.7 10*3/uL — AB (ref 4.0–10.5)

## 2017-05-12 LAB — GLUCOSE, CAPILLARY
GLUCOSE-CAPILLARY: 115 mg/dL — AB (ref 65–99)
GLUCOSE-CAPILLARY: 118 mg/dL — AB (ref 65–99)
GLUCOSE-CAPILLARY: 136 mg/dL — AB (ref 65–99)
Glucose-Capillary: 158 mg/dL — ABNORMAL HIGH (ref 65–99)
Glucose-Capillary: 139 mg/dL — ABNORMAL HIGH (ref 65–99)
Glucose-Capillary: 169 mg/dL — ABNORMAL HIGH (ref 65–99)

## 2017-05-12 MED ORDER — DEXMEDETOMIDINE HCL IN NACL 200 MCG/50ML IV SOLN
0.4000 ug/kg/h | INTRAVENOUS | Status: DC
  Administered 2017-05-12: 0.4 ug/kg/h via INTRAVENOUS
  Administered 2017-05-12: 0.5 ug/kg/h via INTRAVENOUS
  Filled 2017-05-12 (×2): qty 50

## 2017-05-12 MED ORDER — FENTANYL CITRATE (PF) 2500 MCG/50ML IJ SOLN
25.0000 ug/h | Status: DC
  Administered 2017-05-12: 200 ug/h via INTRAVENOUS
  Administered 2017-05-13: 250 ug/h via INTRAVENOUS
  Administered 2017-05-13: 300 ug/h via INTRAVENOUS
  Administered 2017-05-14: 150 ug/h via INTRAVENOUS
  Administered 2017-05-15 – 2017-05-16 (×2): 200 ug/h via INTRAVENOUS
  Filled 2017-05-12 (×6): qty 100

## 2017-05-12 MED ORDER — MIDAZOLAM HCL 2 MG/2ML IJ SOLN
1.0000 mg | INTRAMUSCULAR | Status: DC | PRN
  Administered 2017-05-12 – 2017-05-29 (×8): 1 mg via INTRAVENOUS
  Filled 2017-05-12 (×8): qty 2
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
## 2017-05-12 NOTE — Progress Notes (Signed)
Follow up - Trauma and Critical Care  Patient Details:    Patrick [name] is an 51 y.o. male.  Lines/tubes : Airway 7.5 mm (Active)  Secured at (cm) 23 cm 05/11/2017  8:00 AM  Measured From Lips 05/11/2017  8:00 AM  Secured Location Center 05/11/2017  7:52 AM  Secured By [employer] Tube Holder 05/11/2017  8:00 AM  Tube Holder Repositioned Yes 05/11/2017  7:52 AM  Cuff Pressure (cm H2O) 28 cm H2O 05/10/2017  8:02 PM  Site Condition Dry 05/11/2017  8:00 AM     PICC Triple Lumen 05/08/17 PICC Left Brachial 46 cm 2 cm (Active)  Indication for Insertion or Continuance of Line Prolonged intravenous therapies 05/11/2017  8:00 AM  Exposed Catheter (cm) 2 cm 05/11/2017  8:00 AM  Site Assessment Clean;Dry;Intact 05/11/2017  8:00 AM  Lumen #1 Status In-line blood sampling system in place 05/11/2017  8:00 AM  Lumen #2 Status Infusing 05/11/2017  8:00 AM  Lumen #3 Status Infusing 05/11/2017  8:00 AM  Dressing Type Transparent;Securing device 05/11/2017  8:00 AM  Dressing Status Clean;Dry;Intact;Antimicrobial disc in place 05/11/2017  8:00 AM  Line Care Connections checked and tightened 05/11/2017  8:00 AM  Dressing Change Due 05/15/17 05/11/2017  8:00 AM     NG/OG Tube Orogastric 18 Fr. Left mouth Xray (Active)  Site Assessment Clean;Dry;Intact 05/11/2017  8:00 AM  Ongoing Placement Verification No change in respiratory status;No acute changes, not attributed to clinical condition 05/11/2017  8:00 AM  Status Infusing tube feed 05/11/2017  8:00 AM  Drainage Appearance Bloody 05/10/2017  8:00 AM  Output (mL) 275 mL 05/10/2017  6:00 AM     External Urinary Catheter (Active)  Collection Container Standard drainage bag 05/11/2017  8:00 AM  Securement Method Securing device (Describe) 05/11/2017  8:00 AM  Output (mL) 125 mL 05/11/2017  8:00 AM    Microbiology/Sepsis markers: Results for orders placed or performed during the hospital encounter of 05/08/17  Culture, respiratory  (NON-Expectorated)     Status: None (Preliminary result)   Collection Time: 05/11/17 10:40 AM  Result Value Ref Range Status   Specimen Description TRACHEAL ASPIRATE  Final   Special Requests Normal  Final   Gram Stain   Final    ABUNDANT WBC PRESENT, PREDOMINANTLY PMN RARE SQUAMOUS EPITHELIAL CELLS PRESENT ABUNDANT GRAM NEGATIVE COCCOBACILLI FEW GRAM POSITIVE COCCI IN PAIRS IN CLUSTERS FEW GRAM POSITIVE RODS RARE GRAM NEGATIVE COCCI IN PAIRS    Culture PENDING  Incomplete   Report Status PENDING  Incomplete    Anti-infectives:  Anti-infectives (From admission, onward)   Start     Dose/Rate Route Frequency Ordered Stop   05/11/17 1130  piperacillin-tazobactam (ZOSYN) IVPB 3.375 g     3.375 g 12.5 mL/hr over 240 Minutes Intravenous Every 8 hours 05/11/17 1101     05/11/17 1130  vancomycin (VANCOCIN) IVPB 1000 mg/200 mL premix     1,000 mg 200 mL/hr over 60 Minutes Intravenous Every 8 hours 05/11/17 1101     05/08/17 2300  ceFAZolin (ANCEF) IVPB 1 g/50 mL premix     1 g 100 mL/hr over 30 Minutes Intravenous Every 8 hours 05/08/17 1548 05/09/17 1517   05/08/17 1403  vancomycin (VANCOCIN) powder  Status:  Discontinued       As needed 05/08/17 1403 05/08/17 1521      Best Practice/Protocols:  VTE Prophylaxis: Mechanical GI Prophylaxis: Proton Pump Inhibitor Continous Sedation  Consults: Treatment Team:  Tia AlertJones, David S, MD Haddix, Gillie MannersKevin P, MD  Events:  Subjective:    Overnight Issues: Coughed and self extubated this AM.  Has copious secretions.     Objective:  Vital signs for last 24 hours: Temp:  [98.2 F (36.8 C)-98.8 F (37.1 C)] 98.2 F (36.8 C) (12/15 0400) Pulse Rate:  [57-94] 73 (12/15 0900) Resp:  [17-27] 27 (12/15 0900) BP: (112-151)/(57-76) 151/69 (12/15 0900) SpO2:  [93 %-100 %] 97 % (12/15 0900) FiO2 (%):  [40 %-100 %] 100 % (12/15 0900) Weight:  [84.8 kg (186 lb 15.2 oz)] 84.8 kg (186 lb 15.2 oz) (12/15 0500)  Hemodynamic parameters for last  24 hours:    Intake/Output from previous day: 12/14 0701 - 12/15 0700 In: 4812.3 [I.V.:3102.3; NG/GT:960; IV Piggyback:750] Out: 975 [Urine:975]  Intake/Output this shift: Total I/O In: 388 [I.V.:258; NG/GT:80; IV Piggyback:50] Out: -   Vent settings for last 24 hours: Vent Mode: PRVC FiO2 (%):  [40 %-100 %] 100 % Set Rate:  [26 bmp] 26 bmp Vt Set:  [580 mL] 580 mL PEEP:  [5 cmH20] 5 cmH20 Plateau Pressure:  [21 cmH20-23 cmH20] 23 cmH20  Physical Exam:  General: opens eyes.  Now pretty sedated. Neuro: nonfocal exam, RASS -1 and agitated HEENT/Neck: ETT WNL Resp: wheezes bilaterally and CXR looks better. CVS: regular rate and rhythm.   GI: No specific issues Extremities: no edema, no erythema, pulses WNL  Results for orders placed or performed during the hospital encounter of 05/08/17 (from the past 24 hour(s))  Glucose, capillary     Status: Abnormal   Collection Time: 05/11/17  1:03 PM  Result Value Ref Range   Glucose-Capillary 163 (H) 65 - 99 mg/dL  Glucose, capillary     Status: Abnormal   Collection Time: 05/11/17  3:42 PM  Result Value Ref Range   Glucose-Capillary 106 (H) 65 - 99 mg/dL  Magnesium     Status: None   Collection Time: 05/11/17  4:23 PM  Result Value Ref Range   Magnesium 2.0 1.7 - 2.4 mg/dL  Phosphorus     Status: Abnormal   Collection Time: 05/11/17  4:23 PM  Result Value Ref Range   Phosphorus 1.3 (L) 2.5 - 4.6 mg/dL  Glucose, capillary     Status: Abnormal   Collection Time: 05/11/17  8:45 PM  Result Value Ref Range   Glucose-Capillary 130 (H) 65 - 99 mg/dL  Glucose, capillary     Status: Abnormal   Collection Time: 05/12/17 12:02 AM  Result Value Ref Range   Glucose-Capillary 118 (H) 65 - 99 mg/dL  Glucose, capillary     Status: Abnormal   Collection Time: 05/12/17  4:19 AM  Result Value Ref Range   Glucose-Capillary 158 (H) 65 - 99 mg/dL  CBC with Differential/Platelet     Status: Abnormal   Collection Time: 05/12/17  4:53 AM   Result Value Ref Range   WBC 10.7 (H) 4.0 - 10.5 K/uL   RBC 2.85 (L) 4.22 - 5.81 MIL/uL   Hemoglobin 8.2 (L) 13.0 - 17.0 g/dL   HCT 16.125.1 (L) 09.639.0 - 04.552.0 %   MCV 88.1 78.0 - 100.0 fL   MCH 28.8 26.0 - 34.0 pg   MCHC 32.7 30.0 - 36.0 g/dL   RDW 40.913.7 81.111.5 - 91.415.5 %   Platelets 350 150 - 400 K/uL   Neutrophils Relative % 68 %   Neutro Abs 7.3 1.7 - 7.7 K/uL   Lymphocytes Relative 20 %   Lymphs Abs 2.1 0.7 - 4.0 K/uL   Monocytes Relative  10 %   Monocytes Absolute 1.1 (H) 0.1 - 1.0 K/uL   Eosinophils Relative 2 %   Eosinophils Absolute 0.2 0.0 - 0.7 K/uL   Basophils Relative 0 %   Basophils Absolute 0.0 0.0 - 0.1 K/uL  Basic metabolic panel     Status: Abnormal   Collection Time: 05/12/17  4:53 AM  Result Value Ref Range   Sodium 141 135 - 145 mmol/L   Potassium 3.1 (L) 3.5 - 5.1 mmol/L   Chloride 111 101 - 111 mmol/L   CO2 25 22 - 32 mmol/L   Glucose, Bld 130 (H) 65 - 99 mg/dL   BUN 19 6 - 20 mg/dL   Creatinine, Ser 1.61 0.61 - 1.24 mg/dL   Calcium 7.5 (L) 8.9 - 10.3 mg/dL   GFR calc non Af Amer >60 >60 mL/min   GFR calc Af Amer >60 >60 mL/min   Anion gap 5 5 - 15  Glucose, capillary     Status: Abnormal   Collection Time: 05/12/17  8:06 AM  Result Value Ref Range   Glucose-Capillary 139 (H) 65 - 99 mg/dL     Assessment/Plan:   NEURO  Altered Mental Status:  agitation, delirium and sedation   Plan: Will make appropriate adjustments as we move closer to extubation.  Gets significantly agitated while being suctioned.    PULM  Atelectasis/collapse (focal and RLL) Pneumonia: hospital acquired (not ventilator-associated) cultures are pending.   Plan: Empiric antibiotics. ETT culture pending.  Currently multiple bacteria on gram stain.  While ETT not appropriately positioned, pt dropped sats to low 80s.  Not ready for extubation yet.  Aggressive pulmonary toilet.    CARDIO  No specific problems   Plan: CPM  RENAL  It is very difficult to manage the patient's urine output  and renal function without a Foley.   Plan: Urine output adequate now.    GI  No specific issues   Plan: Tolerating tube feed at goal and will continue  ID  No known infectious source, but seems like he has purulent tracheal aspirate   Plan: Empirically started antibiotics, sent tracheal culture of which gram stain is polymicrobial.  Increased WBCs.  On broad spectrum antibiotics until culture finalized.   HEME  Anemia acute blood loss anemia and anemia of critical illness)   Plan: No blood needed. Slight downward trend.    ENDO Stress related mild hyperglycemia.     Plan: No values high enough to warrant insulin coverage.    Global Issues   Needs additional sedation when getting suctioned.  Will consider adding precedex if bolus propofol not working.      LOS: 4 days   Additional comments:I reviewed the patient's new clinical lab test results. cbc/bmet and I reviewed the patients new imaging test results. CXR  Critical Care Total Time*: 32 Minutes  Almond Lint 05/12/2017  *Care during the described time interval was provided by me and/or other providers on the critical care team.  I have reviewed this patient's available data, including medical history, events of note, physical examination and test results as part of my evaluation.

## 2017-05-12 NOTE — Progress Notes (Signed)
Called by Dr. Donell BeersByerly to evaluate pt.  Pt self-extubated this morning.  RT was able to re-insert endotracheal tube prior to our arrival.  On evaluation, pt had normal SaO2, good respiratory volumes.  Breath sounds bilaterally, and  Positive CO2 by easy cap.  No intervention necessary.

## 2017-05-12 NOTE — Progress Notes (Signed)
During attempt to suction pt, pt became increasingly combative and was moving violently back and forth in the bed.  4 members of RN staff attempted to assist with restraining pt.  Pt was able to self-extubate.  Cuff leak was very evident and return volumes on vent were minimal.  Et tube was reinserted successfully and position was confirmed by ETCO2 detector, bilateral breath sounds and good return volumes on vent.  Pt is currently stable and sedated.

## 2017-05-13 LAB — BASIC METABOLIC PANEL
ANION GAP: 8 (ref 5–15)
BUN: 23 mg/dL — ABNORMAL HIGH (ref 6–20)
CHLORIDE: 109 mmol/L (ref 101–111)
CO2: 23 mmol/L (ref 22–32)
CREATININE: 0.6 mg/dL — AB (ref 0.61–1.24)
Calcium: 7.8 mg/dL — ABNORMAL LOW (ref 8.9–10.3)
GFR calc non Af Amer: >60 mL/min (ref >60)
Glucose, Bld: 111 mg/dL — ABNORMAL HIGH (ref 65–99)
POTASSIUM: 3.3 mmol/L — AB (ref 3.5–5.1)
SODIUM: 140 mmol/L (ref 135–145)

## 2017-05-13 LAB — URINALYSIS, ROUTINE W REFLEX MICROSCOPIC
BILIRUBIN URINE: NEGATIVE
Glucose, UA: NEGATIVE mg/dL
Hgb urine dipstick: NEGATIVE
Ketones, ur: NEGATIVE mg/dL
LEUKOCYTES UA: NEGATIVE
NITRITE: NEGATIVE
PH: 5 (ref 5.0–8.0)
Protein, ur: NEGATIVE mg/dL
SPECIFIC GRAVITY, URINE: 1.039 — AB (ref 1.005–1.030)

## 2017-05-13 LAB — CBC WITH DIFFERENTIAL/PLATELET
Basophils Absolute: 0 10*3/uL (ref 0.0–0.1)
Basophils Relative: 0 %
EOS ABS: 0.3 10*3/uL (ref 0.0–0.7)
Eosinophils Relative: 3 %
HCT: 26 % — ABNORMAL LOW (ref 39.0–52.0)
HEMOGLOBIN: 8.5 g/dL — AB (ref 13.0–17.0)
LYMPHS ABS: 2.5 10*3/uL (ref 0.7–4.0)
LYMPHS PCT: 24 %
MCH: 28.3 pg (ref 26.0–34.0)
MCHC: 32.7 g/dL (ref 30.0–36.0)
MCV: 86.7 fL (ref 78.0–100.0)
Monocytes Absolute: 1 10*3/uL (ref 0.1–1.0)
Monocytes Relative: 10 %
NEUTROS ABS: 6.4 10*3/uL (ref 1.7–7.7)
NEUTROS PCT: 63 %
Platelets: 358 10*3/uL (ref 150–400)
RBC: 3 MIL/uL — AB (ref 4.22–5.81)
RDW: 13.7 % (ref 11.5–15.5)
WBC: 10.3 10*3/uL (ref 4.0–10.5)

## 2017-05-13 LAB — GLUCOSE, CAPILLARY
GLUCOSE-CAPILLARY: 93 mg/dL (ref 65–99)
GLUCOSE-CAPILLARY: 105 mg/dL — AB (ref 65–99)
GLUCOSE-CAPILLARY: 103 mg/dL — AB (ref 65–99)
GLUCOSE-CAPILLARY: 111 mg/dL — AB (ref 65–99)
Glucose-Capillary: 137 mg/dL — ABNORMAL HIGH (ref 65–99)
Glucose-Capillary: 113 mg/dL — ABNORMAL HIGH (ref 65–99)

## 2017-05-13 LAB — VANCOMYCIN, TROUGH: Vancomycin Tr: 12 ug/mL — ABNORMAL LOW (ref 15–20)

## 2017-05-13 MED ORDER — ALTEPLASE 2 MG IJ SOLR
2.0000 mg | Freq: Once | INTRAMUSCULAR | Status: AC
  Administered 2017-05-13: 2 mg
  Filled 2017-05-13: qty 2

## 2017-05-13 MED ORDER — INFLUENZA VAC SPLIT QUAD 0.5 ML IM SUSY
0.5000 mL | PREFILLED_SYRINGE | INTRAMUSCULAR | Status: AC
  Administered 2017-05-14: 0.5 mL via INTRAMUSCULAR
  Filled 2017-05-13: qty 0.5

## 2017-05-13 MED ORDER — VANCOMYCIN HCL 10 G IV SOLR
1500.0000 mg | Freq: Three times a day (TID) | INTRAVENOUS | Status: DC
  Administered 2017-05-13 – 2017-05-15 (×7): 1500 mg via INTRAVENOUS
  Filled 2017-05-13 (×8): qty 1500

## 2017-05-13 MED ORDER — PNEUMOCOCCAL VAC POLYVALENT 25 MCG/0.5ML IJ INJ
0.5000 mL | INJECTION | INTRAMUSCULAR | Status: AC
  Administered 2017-05-14: 0.5 mL via INTRAMUSCULAR
  Filled 2017-05-13: qty 0.5

## 2017-05-13 NOTE — Progress Notes (Signed)
Follow up - Trauma and Critical Care  Patient Details:    Patrick [name] is an 51 y.o. male.  Lines/tubes : Airway 7.5 mm (Active)  Secured at (cm) 23 cm 05/11/2017  8:00 AM  Measured From Lips 05/11/2017  8:00 AM  Secured Location Center 05/11/2017  7:52 AM  Secured By [employer] Tube Holder 05/11/2017  8:00 AM  Tube Holder Repositioned Yes 05/11/2017  7:52 AM  Cuff Pressure (cm H2O) 28 cm H2O 05/10/2017  8:02 PM  Site Condition Dry 05/11/2017  8:00 AM     PICC Triple Lumen 05/08/17 PICC Left Brachial 46 cm 2 cm (Active)  Indication for Insertion or Continuance of Line Prolonged intravenous therapies 05/11/2017  8:00 AM  Exposed Catheter (cm) 2 cm 05/11/2017  8:00 AM  Site Assessment Clean;Dry;Intact 05/11/2017  8:00 AM  Lumen #1 Status In-line blood sampling system in place 05/11/2017  8:00 AM  Lumen #2 Status Infusing 05/11/2017  8:00 AM  Lumen #3 Status Infusing 05/11/2017  8:00 AM  Dressing Type Transparent;Securing device 05/11/2017  8:00 AM  Dressing Status Clean;Dry;Intact;Antimicrobial disc in place 05/11/2017  8:00 AM  Line Care Connections checked and tightened 05/11/2017  8:00 AM  Dressing Change Due 05/15/17 05/11/2017  8:00 AM     NG/OG Tube Orogastric 18 Fr. Left mouth Xray (Active)  Site Assessment Clean;Dry;Intact 05/11/2017  8:00 AM  Ongoing Placement Verification No change in respiratory status;No acute changes, not attributed to clinical condition 05/11/2017  8:00 AM  Status Infusing tube feed 05/11/2017  8:00 AM  Drainage Appearance Bloody 05/10/2017  8:00 AM  Output (mL) 275 mL 05/10/2017  6:00 AM     External Urinary Catheter (Active)  Collection Container Standard drainage bag 05/11/2017  8:00 AM  Securement Method Securing device (Describe) 05/11/2017  8:00 AM  Output (mL) 125 mL 05/11/2017  8:00 AM    Microbiology/Sepsis markers: Results for orders placed or performed during the hospital encounter of 05/08/17  Culture, respiratory  (NON-Expectorated)     Status: None (Preliminary result)   Collection Time: 05/11/17 10:40 AM  Result Value Ref Range Status   Specimen Description TRACHEAL ASPIRATE  Final   Special Requests Normal  Final   Gram Stain   Final    ABUNDANT WBC PRESENT, PREDOMINANTLY PMN RARE SQUAMOUS EPITHELIAL CELLS PRESENT ABUNDANT GRAM NEGATIVE COCCOBACILLI FEW GRAM POSITIVE COCCI IN PAIRS IN CLUSTERS FEW GRAM POSITIVE RODS RARE GRAM NEGATIVE COCCI IN PAIRS    Culture   Final    MODERATE STAPHYLOCOCCUS AUREUS SUSCEPTIBILITIES TO FOLLOW    Report Status PENDING  Incomplete    Anti-infectives:  Anti-infectives (From admission, onward)   Start     Dose/Rate Route Frequency Ordered Stop   05/13/17 0800  vancomycin (VANCOCIN) 1,500 mg in sodium chloride 0.9 % 500 mL IVPB     1,500 mg 250 mL/hr over 120 Minutes Intravenous Every 8 hours 05/13/17 0439     05/11/17 1130  piperacillin-tazobactam (ZOSYN) IVPB 3.375 g     3.375 g 12.5 mL/hr over 240 Minutes Intravenous Every 8 hours 05/11/17 1101     05/11/17 1130  vancomycin (VANCOCIN) IVPB 1000 mg/200 mL premix  Status:  Discontinued     1,000 mg 200 mL/hr over 60 Minutes Intravenous Every 8 hours 05/11/17 1101 05/13/17 0439   05/08/17 2300  ceFAZolin (ANCEF) IVPB 1 g/50 mL premix     1 g 100 mL/hr over 30 Minutes Intravenous Every 8 hours 05/08/17 1548 05/09/17 1517   05/08/17 1403  vancomycin (  VANCOCIN) powder  Status:  Discontinued       As needed 05/08/17 1403 05/08/17 1521      Best Practice/Protocols:  VTE Prophylaxis: Mechanical GI Prophylaxis: Proton Pump Inhibitor Continous Sedation  Consults: Treatment Team:  Tia AlertJones, David S, MD Haddix, Gillie MannersKevin P, MD    Events:  Subjective:    Overnight Issues: Coughed and self extubated yesterday, reintubated. Had BM yesterday, on TFs at goal. Requiring propofol boluses prior to suctioning; still with significant secretions.  His youngest daughter is at bedside this morning. Currently sedated  but followed commands this morning for neurosugery when sedation held. Moved all 4 ext.   Objective:  Vital signs for last 24 hours: Temp:  [97.9 F (36.6 C)-99 F (37.2 C)] 98 F (36.7 C) (12/16 0800) Pulse Rate:  [40-64] 49 (12/16 1000) Resp:  [22-26] 26 (12/16 1000) BP: (107-152)/(59-81) 141/66 (12/16 1000) SpO2:  [95 %-100 %] 99 % (12/16 1000) FiO2 (%):  [40 %] 40 % (12/16 0800) Weight:  [88.9 kg (195 lb 15.8 oz)] 88.9 kg (195 lb 15.8 oz) (12/16 0530)  Hemodynamic parameters for last 24 hours:    Intake/Output from previous day: 12/15 0701 - 12/16 0700 In: 4750.2 [I.V.:2990.2; NG/GT:960; IV Piggyback:800] Out: 700 [Urine:700]  Intake/Output this shift: Total I/O In: 523.6 [I.V.:353.6; NG/GT:120; IV Piggyback:50] Out: 400 [Urine:400]  Vent settings for last 24 hours: Vent Mode: PRVC FiO2 (%):  [40 %] 40 % Set Rate:  [26 bmp] 26 bmp Vt Set:  [580 mL] 580 mL PEEP:  [5 cmH20] 5 cmH20 Plateau Pressure:  [20 cmH20-24 cmH20] 24 cmH20  Physical Exam:  General: opens eyes.  Now pretty sedated. Neuro: nonfocal exam, RASS -1 and agitated HEENT/Neck: ETT WNL Resp: wheezes bilaterally and CXR looks better. CVS: regular rate and rhythm.   GI: No specific issues Extremities: no edema, no erythema, pulses WNL  Results for orders placed or performed during the hospital encounter of 05/08/17 (from the past 24 hour(s))  Glucose, capillary     Status: Abnormal   Collection Time: 05/12/17 12:12 PM  Result Value Ref Range   Glucose-Capillary 169 (H) 65 - 99 mg/dL  Glucose, capillary     Status: Abnormal   Collection Time: 05/12/17  3:54 PM  Result Value Ref Range   Glucose-Capillary 118 (H) 65 - 99 mg/dL  Glucose, capillary     Status: Abnormal   Collection Time: 05/12/17  8:45 PM  Result Value Ref Range   Glucose-Capillary 136 (H) 65 - 99 mg/dL  Urinalysis, Routine w reflex microscopic     Status: Abnormal   Collection Time: 05/12/17 10:05 PM  Result Value Ref Range    Color, Urine AMBER (A) YELLOW   APPearance CLEAR CLEAR   Specific Gravity, Urine 1.039 (H) 1.005 - 1.030   pH 5.0 5.0 - 8.0   Glucose, UA NEGATIVE NEGATIVE mg/dL   Hgb urine dipstick NEGATIVE NEGATIVE   Bilirubin Urine NEGATIVE NEGATIVE   Ketones, ur NEGATIVE NEGATIVE mg/dL   Protein, ur NEGATIVE NEGATIVE mg/dL   Nitrite NEGATIVE NEGATIVE   Leukocytes, UA NEGATIVE NEGATIVE  Glucose, capillary     Status: Abnormal   Collection Time: 05/12/17 11:52 PM  Result Value Ref Range   Glucose-Capillary 115 (H) 65 - 99 mg/dL  Vancomycin, trough     Status: Abnormal   Collection Time: 05/13/17  2:55 AM  Result Value Ref Range   Vancomycin Tr 12 (L) 15 - 20 ug/mL  Glucose, capillary     Status: None  Collection Time: 05/13/17  3:35 AM  Result Value Ref Range   Glucose-Capillary 93 65 - 99 mg/dL  Basic metabolic panel     Status: Abnormal   Collection Time: 05/13/17  5:54 AM  Result Value Ref Range   Sodium 140 135 - 145 mmol/L   Potassium 3.3 (L) 3.5 - 5.1 mmol/L   Chloride 109 101 - 111 mmol/L   CO2 23 22 - 32 mmol/L   Glucose, Bld 111 (H) 65 - 99 mg/dL   BUN 23 (H) 6 - 20 mg/dL   Creatinine, Ser 1.61 (L) 0.61 - 1.24 mg/dL   Calcium 7.8 (L) 8.9 - 10.3 mg/dL   GFR calc non Af Amer >60 >60 mL/min   GFR calc Af Amer >60 >60 mL/min   Anion gap 8 5 - 15  CBC with Differential/Platelet     Status: Abnormal   Collection Time: 05/13/17  5:54 AM  Result Value Ref Range   WBC 10.3 4.0 - 10.5 K/uL   RBC 3.00 (L) 4.22 - 5.81 MIL/uL   Hemoglobin 8.5 (L) 13.0 - 17.0 g/dL   HCT 09.6 (L) 04.5 - 40.9 %   MCV 86.7 78.0 - 100.0 fL   MCH 28.3 26.0 - 34.0 pg   MCHC 32.7 30.0 - 36.0 g/dL   RDW 81.1 91.4 - 78.2 %   Platelets 358 150 - 400 K/uL   Neutrophils Relative % 63 %   Neutro Abs 6.4 1.7 - 7.7 K/uL   Lymphocytes Relative 24 %   Lymphs Abs 2.5 0.7 - 4.0 K/uL   Monocytes Relative 10 %   Monocytes Absolute 1.0 0.1 - 1.0 K/uL   Eosinophils Relative 3 %   Eosinophils Absolute 0.3 0.0 - 0.7  K/uL   Basophils Relative 0 %   Basophils Absolute 0.0 0.0 - 0.1 K/uL  Glucose, capillary     Status: Abnormal   Collection Time: 05/13/17  8:52 AM  Result Value Ref Range   Glucose-Capillary 105 (H) 65 - 99 mg/dL     Assessment/Plan:   NEURO  Altered Mental Status:  agitation, delirium and sedation   Plan: Will make appropriate adjustments as we move closer to extubation.  Gets significantly agitated while being suctioned - needs propofol boluses to complete the suctioning  PULM  Atelectasis/collapse (focal and RLL) Pneumonia: hospital acquired (not ventilator-associated) cultures are pending.   Plan: Empiric antibiotics. ETT culture pending.  Currently multiple bacteria on gram stain. Not ready for extubation today.  Aggressive pulmonary toilet.    CARDIO  No specific problems   Plan: CPM  RENAL  It is very difficult to manage the patient's urine output and renal function without a Foley.   Plan: Urine output adequate.    GI  No specific issues   Plan: Tolerating tube feed at goal; continue  ID  No known infectious source, but seems like he has purulent tracheal aspirate   Plan: Empirically started antibiotics - vanc/zosyn, sent tracheal culture- abundant Gram neg coccobacilli; moderate staph aureus; sensitivities pending.  On broad spectrum antibiotics; will tailor as sensitivities return  HEME  Anemia acute blood loss anemia and anemia of critical illness)   Plan: No blood needed    ENDO Stress related mild hyperglycemia.     Plan: No values high enough to warrant insulin coverage.    Global Issues   Needs additional sedation when getting suctioned.  Will consider adding precedex if bolus propofol not working.      LOS: 5 days  Additional comments:I reviewed the patient's new clinical lab test results. cbc/bmet and I reviewed the patients new imaging test results. CXR from yesterday  Critical Care Total Time*: 34 Minutes  Andria Meuse 05/13/2017  *Care  during the described time interval was provided by me and/or other providers on the critical care team.  I have reviewed this patient's available data, including medical history, events of note, physical examination and test results as part of my evaluation.
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
## 2017-05-13 NOTE — Progress Notes (Signed)
Patient ID: Patrick [name], male   DOB: 06/28/1965, 51 y.o.   MRN: [id number] Subjective: The patient is sedated and in no apparent distress.  His daughter is at the bedside.  Objective: Vital signs in last 24 hours: Temp:  [97.9 F (36.6 C)-99 F (37.2 C)] 97.9 F (36.6 C) (12/16 0400) Pulse Rate:  [40-64] 44 (12/16 0800) Resp:  [22-26] 26 (12/16 0800) BP: (107-152)/(59-81) 149/66 (12/16 0800) SpO2:  [95 %-100 %] 98 % (12/16 0800) FiO2 (%):  [40 %] 40 % (12/16 0800) Weight:  [88.9 kg (195 lb 15.8 oz)] 88.9 kg (195 lb 15.8 oz) (12/16 0530)  Intake/Output from previous day: 12/15 0701 - 12/16 0700 In: 4750.2 [I.V.:2990.2; NG/GT:960; IV Piggyback:800] Out: 700 [Urine:700] Intake/Output this shift: Total I/O In: 161.8 [I.V.:121.8; NG/GT:40] Out: 400 [Urine:400]  Physical exam: The patient will follow commands when sedation is minimized.  He is moving all 4 extremities.  Lab Results: Recent Labs    05/12/17 0453 05/13/17 0554  WBC 10.7* 10.3  HGB 8.2* 8.5*  HCT 25.1* 26.0*  PLT 350 358   BMET Recent Labs    05/12/17 0453 05/13/17 0554  NA 141 140  K 3.1* 3.3*  CL 111 109  CO2 25 23  GLUCOSE 130* 111*  BUN 19 23*  CREATININE 0.68 0.60*  CALCIUM 7.5* 7.8*    Studies/Results: Dg Chest Port 1 View  Result Date: 05/12/2017 CLINICAL DATA:  Respiratory failure and stat cyst post re- intubation. EXAM: PORTABLE CHEST 1 VIEW COMPARISON:  Film at 0700 hours FINDINGS: Endotracheal tube present with tip approximately 5 cm above the carina. Nasogastric tube tip in the proximal stomach. Lungs show improved aeration at both bases with residual atelectasis remaining in both lower lungs. Stable PICC line positioning at the SVC/ RA junction. No pneumothorax. IMPRESSION: Endotracheal tube tip is approximately 5 cm above the carina. Lungs show improved aeration with some residual atelectasis in both lower lungs. Electronically Signed   By: Irish LackGlenn  Yamagata M.D.   On: 05/12/2017  09:23   Dg Chest Port 1 View  Result Date: 05/12/2017 CLINICAL DATA:  Chest trauma.  Pneumonia. EXAM: PORTABLE CHEST 1 VIEW COMPARISON:  May 11, 2017 FINDINGS: The ETT remains in good position. The NG tube terminates below today's film. The left PICC line is stable, in good position. No pneumothorax. Bilateral pleural effusions with underlying opacities have worsened. The cardiomediastinal silhouette is stable. No nodules or masses. IMPRESSION: Increasing bilateral pleural effusions with underlying opacities. Stable support apparatus. Electronically Signed   By: Gerome Samavid  Williams III M.D   On: 05/12/2017 07:52    Assessment/Plan: Traumatic brain injury: The patient is clinically stable neurologically.  LOS: 5 days     Cristi LoronJeffrey D Manar Smalling 05/13/2017, 9:01 AM

## 2017-05-14 ENCOUNTER — Encounter (HOSPITAL_COMMUNITY): Payer: Self-pay | Admitting: Student

## 2017-05-14 ENCOUNTER — Inpatient Hospital Stay (HOSPITAL_COMMUNITY): Payer: No Typology Code available for payment source

## 2017-05-14 LAB — BASIC METABOLIC PANEL
ANION GAP: 5 (ref 5–15)
BUN: 24 mg/dL — ABNORMAL HIGH (ref 6–20)
CALCIUM: 7.9 mg/dL — AB (ref 8.9–10.3)
CO2: 24 mmol/L (ref 22–32)
Chloride: 111 mmol/L (ref 101–111)
Creatinine, Ser: 0.65 mg/dL (ref 0.61–1.24)
Glucose, Bld: 118 mg/dL — ABNORMAL HIGH (ref 65–99)
Potassium: 3.6 mmol/L (ref 3.5–5.1)
SODIUM: 140 mmol/L (ref 135–145)

## 2017-05-14 LAB — CBC WITH DIFFERENTIAL/PLATELET
Basophils Absolute: 0.1 10*3/uL (ref 0.0–0.1)
Basophils Relative: 1 %
EOS ABS: 0.3 10*3/uL (ref 0.0–0.7)
EOS PCT: 3 %
HEMATOCRIT: 25.6 % — AB (ref 39.0–52.0)
Hemoglobin: 8.4 g/dL — ABNORMAL LOW (ref 13.0–17.0)
Lymphocytes Relative: 21 %
Lymphs Abs: 2.1 10*3/uL (ref 0.7–4.0)
MCH: 28.8 pg (ref 26.0–34.0)
MCHC: 32.8 g/dL (ref 30.0–36.0)
MCV: 87.7 fL (ref 78.0–100.0)
MONO ABS: 1.1 10*3/uL — AB (ref 0.1–1.0)
Monocytes Relative: 11 %
NEUTROS PCT: 64 %
Neutro Abs: 6.6 10*3/uL (ref 1.7–7.7)
PLATELETS: 402 10*3/uL — AB (ref 150–400)
RBC: 2.92 MIL/uL — AB (ref 4.22–5.81)
RDW: 13.8 % (ref 11.5–15.5)
WBC: 10.2 10*3/uL (ref 4.0–10.5)

## 2017-05-14 LAB — CULTURE, RESPIRATORY W GRAM STAIN

## 2017-05-14 LAB — CULTURE, RESPIRATORY: SPECIAL REQUESTS: NORMAL

## 2017-05-14 LAB — TRIGLYCERIDES: Triglycerides: 103 mg/dL (ref <150)

## 2017-05-14 LAB — GLUCOSE, CAPILLARY
GLUCOSE-CAPILLARY: 112 mg/dL — AB (ref 65–99)
GLUCOSE-CAPILLARY: 132 mg/dL — AB (ref 65–99)
GLUCOSE-CAPILLARY: 145 mg/dL — AB (ref 65–99)
Glucose-Capillary: 128 mg/dL — ABNORMAL HIGH (ref 65–99)
Glucose-Capillary: 128 mg/dL — ABNORMAL HIGH (ref 65–99)
Glucose-Capillary: 134 mg/dL — ABNORMAL HIGH (ref 65–99)

## 2017-05-14 MED ORDER — ALTEPLASE 2 MG IJ SOLR
2.0000 mg | Freq: Once | INTRAMUSCULAR | Status: DC
  Filled 2017-05-14: qty 2

## 2017-05-14 MED ORDER — HALOPERIDOL LACTATE 5 MG/ML IJ SOLN
5.0000 mg | Freq: Once | INTRAMUSCULAR | Status: AC
  Administered 2017-05-14: 5 mg via INTRAMUSCULAR

## 2017-05-14 MED ORDER — FUROSEMIDE 10 MG/ML IJ SOLN
40.0000 mg | Freq: Once | INTRAMUSCULAR | Status: AC
  Administered 2017-05-14: 40 mg via INTRAVENOUS
  Filled 2017-05-14: qty 4

## 2017-05-14 MED ORDER — HALOPERIDOL LACTATE 5 MG/ML IJ SOLN
INTRAMUSCULAR | Status: AC
  Filled 2017-05-14: qty 1

## 2017-05-14 MED ORDER — POTASSIUM CHLORIDE 10 MEQ/50ML IV SOLN
10.0000 meq | INTRAVENOUS | Status: AC
  Administered 2017-05-14 (×2): 10 meq via INTRAVENOUS
  Filled 2017-05-14 (×2): qty 50

## 2017-05-14 MED ORDER — LORAZEPAM 2 MG/ML IJ SOLN
INTRAMUSCULAR | Status: AC
  Filled 2017-05-14: qty 1

## 2017-05-14 MED ORDER — LORAZEPAM 2 MG/ML IJ SOLN
2.0000 mg | Freq: Once | INTRAMUSCULAR | Status: AC
  Administered 2017-05-14: 2 mg via INTRAMUSCULAR

## 2017-05-14 NOTE — Progress Notes (Addendum)
Follow up - Trauma Critical Care  Patient Details:    Patrick [name] is an 51 y.o. male.  Lines/tubes : Airway 7.5 mm (Active)  Secured at (cm) 23 cm 05/14/2017  7:39 AM  Measured From Lips 05/14/2017  7:39 AM  Secured Location Left 05/14/2017  7:39 AM  Secured By [employer] Tube Holder 05/14/2017  7:39 AM  Tube Holder Repositioned Yes 05/14/2017  7:39 AM  Cuff Pressure (cm H2O) 26 cm H2O 05/14/2017  3:31 AM  Site Condition Dry 05/14/2017  7:39 AM     PICC Triple Lumen 05/08/17 PICC Left Brachial 46 cm 2 cm (Active)  Indication for Insertion or Continuance of Line Prolonged intravenous therapies 05/13/2017  8:00 PM  Exposed Catheter (cm) 2 cm 05/11/2017  8:00 AM  Site Assessment Clean;Dry;Intact 05/13/2017  8:00 PM  Lumen #1 Status In-line blood sampling system in place 05/13/2017  8:00 PM  Lumen #2 Status Saline locked 05/13/2017  8:00 PM  Lumen #3 Status Infusing 05/13/2017  8:00 PM  Dressing Type Transparent;Securing device 05/13/2017  8:00 PM  Dressing Status Clean;Dry;Intact;Antimicrobial disc in place 05/13/2017  8:00 PM  Line Care Lumen 3 tubing changed 05/14/2017  1:00 AM  Dressing Change Due 05/15/17 05/13/2017  8:00 PM     NG/OG Tube Orogastric 18 Fr. Left mouth Xray (Active)  External Length of Tube (cm) - (if applicable) 64 cm 05/13/2017  8:00 AM  Site Assessment Clean;Dry;Intact 05/13/2017  8:00 PM  Ongoing Placement Verification No change in respiratory status;No acute changes, not attributed to clinical condition 05/13/2017  8:00 PM  Status Infusing tube feed 05/13/2017  8:00 PM  Drainage Appearance Bloody 05/10/2017  8:00 AM  Output (mL) 275 mL 05/10/2017  6:00 AM     External Urinary Catheter (Active)  Collection Container Standard drainage bag 05/13/2017  8:00 PM  Securement Method Securing device (Describe) 05/13/2017  8:00 PM  Output (mL) 525 mL 05/14/2017  6:24 AM    Microbiology/Sepsis markers: Results for orders placed or performed during the  hospital encounter of 05/08/17  Culture, respiratory (NON-Expectorated)     Status: None (Preliminary result)   Collection Time: 05/11/17 10:40 AM  Result Value Ref Range Status   Specimen Description TRACHEAL ASPIRATE  Final   Special Requests Normal  Final   Gram Stain   Final    ABUNDANT WBC PRESENT, PREDOMINANTLY PMN RARE SQUAMOUS EPITHELIAL CELLS PRESENT ABUNDANT GRAM NEGATIVE COCCOBACILLI FEW GRAM POSITIVE COCCI IN PAIRS IN CLUSTERS FEW GRAM POSITIVE RODS RARE GRAM NEGATIVE COCCI IN PAIRS    Culture   Final    MODERATE STAPHYLOCOCCUS AUREUS SUSCEPTIBILITIES TO FOLLOW    Report Status PENDING  Incomplete    Anti-infectives:  Anti-infectives (From admission, onward)   Start     Dose/Rate Route Frequency Ordered Stop   05/13/17 0800  vancomycin (VANCOCIN) 1,500 mg in sodium chloride 0.9 % 500 mL IVPB     1,500 mg 250 mL/hr over 120 Minutes Intravenous Every 8 hours 05/13/17 0439     05/11/17 1130  piperacillin-tazobactam (ZOSYN) IVPB 3.375 g     3.375 g 12.5 mL/hr over 240 Minutes Intravenous Every 8 hours 05/11/17 1101     05/11/17 1130  vancomycin (VANCOCIN) IVPB 1000 mg/200 mL premix  Status:  Discontinued     1,000 mg 200 mL/hr over 60 Minutes Intravenous Every 8 hours 05/11/17 1101 05/13/17 0439   05/08/17 2300  ceFAZolin (ANCEF) IVPB 1 g/50 mL premix     1 g 100 mL/hr over 30  Minutes Intravenous Every 8 hours 05/08/17 1548 05/09/17 1517   05/08/17 1403  vancomycin (VANCOCIN) powder  Status:  Discontinued       As needed 05/08/17 1403 05/08/17 1521      Best Practice/Protocols:  VTE Prophylaxis: Lovenox (prophylaxtic dose) Continous Sedation  Consults: Treatment Team:  Tia AlertJones, David S, MD Haddix, Gillie MannersKevin P, MD    Studies:    Events:  Subjective:    Overnight Issues:   Objective:  Vital signs for last 24 hours: Temp:  [97.6 F (36.4 C)-99.6 F (37.6 C)] 99.6 F (37.6 C) (12/17 0400) Pulse Rate:  [49-64] 52 (12/17 0800) Resp:  [26] 26 (12/17  0800) BP: (126-151)/(62-73) 134/64 (12/17 0800) SpO2:  [93 %-100 %] 93 % (12/17 0800) FiO2 (%):  [30 %-40 %] 40 % (12/17 0800) Weight:  [91.3 kg (201 lb 4.5 oz)] 91.3 kg (201 lb 4.5 oz) (12/17 0534)  Hemodynamic parameters for last 24 hours:    Intake/Output from previous day: 12/16 0701 - 12/17 0700 In: 5601.4 [I.V.:2941.4; NG/GT:960; IV Piggyback:1700] Out: 1900 [Urine:1900]  Intake/Output this shift: Total I/O In: 161.8 [I.V.:121.8; NG/GT:40] Out: -   Vent settings for last 24 hours: Vent Mode: PRVC FiO2 (%):  [30 %-40 %] 40 % Set Rate:  [26 bmp] 26 bmp Vt Set:  [580 mL] 580 mL PEEP:  [5 cmH20] 5 cmH20 Plateau Pressure:  [21 cmH20-26 cmH20] 24 cmH20  Physical Exam:  General: on vent Neuro: sedated but F/C to squeeze hand HEENT/Neck: ETT and periorbital ecchymoses Resp: few rhonchi CVS: RRR GI: soft, NT, +BS  Results for orders placed or performed during the hospital encounter of 05/08/17 (from the past 24 hour(s))  Glucose, capillary     Status: Abnormal   Collection Time: 05/13/17 12:16 PM  Result Value Ref Range   Glucose-Capillary 137 (H) 65 - 99 mg/dL  Glucose, capillary     Status: Abnormal   Collection Time: 05/13/17  4:34 PM  Result Value Ref Range   Glucose-Capillary 113 (H) 65 - 99 mg/dL  Glucose, capillary     Status: Abnormal   Collection Time: 05/13/17  7:52 PM  Result Value Ref Range   Glucose-Capillary 103 (H) 65 - 99 mg/dL  Glucose, capillary     Status: Abnormal   Collection Time: 05/13/17 11:22 PM  Result Value Ref Range   Glucose-Capillary 111 (H) 65 - 99 mg/dL  Glucose, capillary     Status: Abnormal   Collection Time: 05/14/17  3:27 AM  Result Value Ref Range   Glucose-Capillary 128 (H) 65 - 99 mg/dL  CBC with Differential/Platelet     Status: Abnormal (Preliminary result)   Collection Time: 05/14/17  5:00 AM  Result Value Ref Range   WBC 10.2 4.0 - 10.5 K/uL   RBC 2.92 (L) 4.22 - 5.81 MIL/uL   Hemoglobin 8.4 (L) 13.0 - 17.0 g/dL    HCT 16.125.6 (L) 09.639.0 - 52.0 %   MCV 87.7 78.0 - 100.0 fL   MCH 28.8 26.0 - 34.0 pg   MCHC 32.8 30.0 - 36.0 g/dL   RDW 04.513.8 40.911.5 - 81.115.5 %   Platelets 402 (H) 150 - 400 K/uL   Neutrophils Relative % PENDING %   Neutro Abs PENDING 1.7 - 7.7 K/uL   Band Neutrophils PENDING %   Lymphocytes Relative PENDING %   Lymphs Abs PENDING 0.7 - 4.0 K/uL   Monocytes Relative PENDING %   Monocytes Absolute PENDING 0.1 - 1.0 K/uL   Eosinophils Relative PENDING %  Eosinophils Absolute PENDING 0.0 - 0.7 K/uL   Basophils Relative PENDING %   Basophils Absolute PENDING 0.0 - 0.1 K/uL   WBC Morphology PENDING    RBC Morphology PENDING    Smear Review PENDING    nRBC PENDING 0 /100 WBC   Metamyelocytes Relative PENDING %   Myelocytes PENDING %   Promyelocytes Absolute PENDING %   Blasts PENDING %  Basic metabolic panel     Status: Abnormal   Collection Time: 05/14/17  5:00 AM  Result Value Ref Range   Sodium 140 135 - 145 mmol/L   Potassium 3.6 3.5 - 5.1 mmol/L   Chloride 111 101 - 111 mmol/L   CO2 24 22 - 32 mmol/L   Glucose, Bld 118 (H) 65 - 99 mg/dL   BUN 24 (H) 6 - 20 mg/dL   Creatinine, Ser 1.61 0.61 - 1.24 mg/dL   Calcium 7.9 (L) 8.9 - 10.3 mg/dL   GFR calc non Af Amer >60 >60 mL/min   GFR calc Af Amer >60 >60 mL/min   Anion gap 5 5 - 15  Triglycerides     Status: None   Collection Time: 05/14/17  5:40 AM  Result Value Ref Range   Triglycerides 103 <150 mg/dL  Glucose, capillary     Status: Abnormal   Collection Time: 05/14/17  8:23 AM  Result Value Ref Range   Glucose-Capillary 128 (H) 65 - 99 mg/dL   Comment 1 Notify RN    Comment 2 Document in Chart     Assessment & Plan: Present on Admission: . Depressed skull fracture (HCC)    LOS: 6 days   Additional comments:I reviewed the patient's new clinical lab test results. and foot x-ray PHBC TBI/B frontal ICC/depressed frontal bone FX - F/C for me, F/U CT head in AM per Dr. Yetta Barre B orbit/ethmoid/crib plate FXs - Dr. Jenne Pane and  Dr. Dione Booze following R BB FA FX - S/P ORIF by Dr. Jena Gauss 12/11 Acute hypercarbic vent dependent resp failure - weaning PSA - positive for meth and TCH, CSW eval once extubated ID - Vanc/Zosyn empiric, suspect HCAP, resp CX staph so far with sens P R knee edema - further ortho eval once off vent R foot x-ray neg VTE - PAS FEN - TF, decrease IVF and give lasix x 1 as 18L positive, supplement K Dispo - ICU. Critical Care Total Time*: 32 Minutes  Violeta Gelinas, MD, MPH, [facility] Trauma: [phone] General Surgery: [phone]  05/14/2017  *Care during the described time interval was provided by me. I have reviewed this patient's available data, including medical history, events of note, physical examination and test results as part of my evaluation.  Patient ID: Patrick [name], male   DOB: [birth year], 51 y.o.   MRN: [id number]

## 2017-05-14 NOTE — Progress Notes (Signed)
   Subjective:    Patient ID: Patrick [name], male    DOB: 1965/12/26, 51 y.o.   MRN: [id number]  HPI  Remains stable, on ventilator with sedation.  Weaned on ventilator somewhat today.  Review of Systems     Objective:   Physical Exam AF VSS Sedated, intubated. Bilateral periorbital edema and ecchymosis slightly improved.  Pupils small and equal.  Forehead concavity with right vertical ridge somewhat more apparent.  Nasal bones with some deformity but in fairly good position.  Stepoff at left upper lateral nose palpable.    Assessment & Plan:  Frontal fracture with NOE fracture and nasal fracture  Discussed situation with Dr. [name], patient's sister, daughter, and fiancee.  Dr. [name] favors not manipulating the frontal bone, if possible, due to concern about the underlying frontal contusions.  I discussed potential approaches to the fracture with his family including a bicoronal incision (he has a very posterior hairline, however) and a seagull brow incision in order to manipulate and stabilize the bone of the forehead and NOE.  Alternatively, the bone can be allowed to heal in place and a delayed cranioplasty approach could be considered in the future, if he desires recontouring of the forehead.  Either way, closed nasal reduction can be considered to manage the nose.  In discussing options with the family, his daughter wishes to proceed with open reduction and internal fixation of the frontal fracture and NOE with closed nasal reduction.  I will make arrangements with Dr. [name] and the OR.  Risks, benefits, and alternatives were discussed.

## 2017-05-14 NOTE — Progress Notes (Signed)
Patient ID: Patrick [name] Dean Ganus, male   DOB: Nov 25, 1965, 51 y.o.   MRN: [id number] Patient remains intubated and sedated. Pupils small. Overall stable. Following. We'll repeat head CT tomorrow

## 2017-05-15 ENCOUNTER — Inpatient Hospital Stay (HOSPITAL_COMMUNITY): Payer: No Typology Code available for payment source

## 2017-05-15 LAB — BASIC METABOLIC PANEL
Anion gap: 4 — ABNORMAL LOW (ref 5–15)
BUN: 21 mg/dL — ABNORMAL HIGH (ref 6–20)
CALCIUM: 8 mg/dL — AB (ref 8.9–10.3)
CO2: 24 mmol/L (ref 22–32)
CREATININE: 0.58 mg/dL — AB (ref 0.61–1.24)
Chloride: 109 mmol/L (ref 101–111)
GFR calc Af Amer: >60 mL/min (ref >60)
GFR calc non Af Amer: >60 mL/min (ref >60)
GLUCOSE: 148 mg/dL — AB (ref 65–99)
Potassium: 3.7 mmol/L (ref 3.5–5.1)
Sodium: 137 mmol/L (ref 135–145)

## 2017-05-15 LAB — GLUCOSE, CAPILLARY
Glucose-Capillary: 101 mg/dL — ABNORMAL HIGH (ref 65–99)
Glucose-Capillary: 118 mg/dL — ABNORMAL HIGH (ref 65–99)
Glucose-Capillary: 124 mg/dL — ABNORMAL HIGH (ref 65–99)
Glucose-Capillary: 124 mg/dL — ABNORMAL HIGH (ref 65–99)
Glucose-Capillary: 139 mg/dL — ABNORMAL HIGH (ref 65–99)
Glucose-Capillary: 155 mg/dL — ABNORMAL HIGH (ref 65–99)

## 2017-05-15 LAB — CBC
HEMATOCRIT: 27.2 % — AB (ref 39.0–52.0)
Hemoglobin: 8.8 g/dL — ABNORMAL LOW (ref 13.0–17.0)
MCH: 28.3 pg (ref 26.0–34.0)
MCHC: 32.4 g/dL (ref 30.0–36.0)
MCV: 87.5 fL (ref 78.0–100.0)
Platelets: 441 10*3/uL — ABNORMAL HIGH (ref 150–400)
RBC: 3.11 MIL/uL — ABNORMAL LOW (ref 4.22–5.81)
RDW: 13.7 % (ref 11.5–15.5)
WBC: 13.9 10*3/uL — ABNORMAL HIGH (ref 4.0–10.5)

## 2017-05-15 MED ORDER — CEFAZOLIN SODIUM-DEXTROSE 1-4 GM/50ML-% IV SOLN
1.0000 g | Freq: Three times a day (TID) | INTRAVENOUS | Status: AC
  Administered 2017-05-15 – 2017-05-21 (×21): 1 g via INTRAVENOUS
  Filled 2017-05-15 (×22): qty 50

## 2017-05-15 MED ORDER — ENOXAPARIN SODIUM 40 MG/0.4ML ~~LOC~~ SOLN
40.0000 mg | SUBCUTANEOUS | Status: DC
  Administered 2017-05-15 – 2017-06-06 (×23): 40 mg via SUBCUTANEOUS
  Filled 2017-05-15 (×24): qty 0.4

## 2017-05-15 MED ORDER — POTASSIUM CHLORIDE 20 MEQ/15ML (10%) PO SOLN
40.0000 meq | Freq: Once | ORAL | Status: AC
  Administered 2017-05-15: 40 meq
  Filled 2017-05-15: qty 30

## 2017-05-15 MED ORDER — FUROSEMIDE 10 MG/ML IJ SOLN
40.0000 mg | Freq: Once | INTRAMUSCULAR | Status: AC
  Administered 2017-05-15: 40 mg via INTRAVENOUS
  Filled 2017-05-15: qty 4

## 2017-05-15 NOTE — Progress Notes (Signed)
PICC line clotted, patient unable to receive sedation and became severely agitated.  Patient began to make loud noises around ETT.  Respiratory added air to the cuff with no decrease in sounds.  Dr. Janee Mornhompson notified. Anesthesia notified and came to bedside.  Patient became calm once sedation resumed, cuff leak minimal at that time. Anesthesia stated they will hold off changing tube at this time but will continue to monitor.

## 2017-05-15 NOTE — Progress Notes (Signed)
Orthopaedic Trauma Progress Note  S: Still remains intubated  O:  Vitals:   05/15/17 0700 05/15/17 0722  BP: (!) 143/85 (!) 143/85  Pulse: (!) 52 (!) 54  Resp: (!) 26 (!) 30  Temp:    SpO2: 95% 93%  Right upper extremity reveals incisions clean dry and intact.  Compartments are soft and compressible.  Cannot follow motor or sensory exam  Labs:  Hemoglobin & Hematocrit     Component Value Date/Time   HGB 8.8 (L) 05/15/2017 0542   HCT 27.2 (L) 05/15/2017 [id number]   A/P: 51 year old male with right closed both bone forearm fracture status post ORIF along with ACL, LCL right knee injury.  Nonweightbearing right upper extremity.  No range of motion restrictions Right knee injury may be an old injury, we will right now and evaluated once he is able to participate in exam. WBAT RLE Dry dressing to right arm  Roby LoftsKevin P. Foch Rosenwald, MD [facility] [phone] (phone)

## 2017-05-15 NOTE — Progress Notes (Signed)
Patient ID: Patrick [name] Dean Chargois, male   DOB: 05-Apr-1966, 51 y.o.   MRN: [id number] CT head shows expected evolution (improvement) of bifrontal contusions, no change R frontal fx. He is sedated and intubated but ok to wean to extubate from my standpoint. I see no significant cosmetic defect (maybe mild swelling only) from the fracture at present, and I'm not sure a bifrontal incision to repair the fracture will offer better cosmesis, I would rec to the family to be less aggressive with skull repair which I think is almost purely cosmetic (and would have risks) in this instance and focus on recovery.

## 2017-05-15 NOTE — Progress Notes (Signed)
Patient ID: [name], male   DOB: May 23, 1966, 51 y.o.   MRN: [id number] I D/W Dr. [name], OK to start Lovenox. Violeta GelinasBurke Sable Knoles, MD, MPH, FACS Trauma: [phone] General Surgery: [phone]
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
## 2017-05-16 ENCOUNTER — Encounter (HOSPITAL_COMMUNITY): Admission: EM | Disposition: A | Discharge status: Rehabilitation Facility | Payer: Self-pay | Source: Home / Self Care

## 2017-05-16 ENCOUNTER — Inpatient Hospital Stay (HOSPITAL_COMMUNITY): Payer: No Typology Code available for payment source | Admitting: Anesthesiology

## 2017-05-16 ENCOUNTER — Encounter (HOSPITAL_COMMUNITY): Payer: Self-pay

## 2017-05-16 ENCOUNTER — Inpatient Hospital Stay (HOSPITAL_COMMUNITY): Payer: No Typology Code available for payment source

## 2017-05-16 HISTORY — PX: CLOSED REDUCTION NASAL FRACTURE: SHX5365

## 2017-05-16 LAB — BASIC METABOLIC PANEL
Anion gap: 10 (ref 5–15)
BUN: 21 mg/dL — ABNORMAL HIGH (ref 6–20)
CHLORIDE: 105 mmol/L (ref 101–111)
CO2: 23 mmol/L (ref 22–32)
CREATININE: 0.57 mg/dL — AB (ref 0.61–1.24)
Calcium: 8.4 mg/dL — ABNORMAL LOW (ref 8.9–10.3)
GFR calc Af Amer: >60 mL/min (ref >60)
GFR calc non Af Amer: >60 mL/min (ref >60)
Glucose, Bld: 95 mg/dL (ref 65–99)
POTASSIUM: 3.8 mmol/L (ref 3.5–5.1)
SODIUM: 138 mmol/L (ref 135–145)

## 2017-05-16 LAB — GLUCOSE, CAPILLARY
GLUCOSE-CAPILLARY: 108 mg/dL — AB (ref 65–99)
GLUCOSE-CAPILLARY: 118 mg/dL — AB (ref 65–99)
Glucose-Capillary: 106 mg/dL — ABNORMAL HIGH (ref 65–99)
Glucose-Capillary: 108 mg/dL — ABNORMAL HIGH (ref 65–99)
Glucose-Capillary: 93 mg/dL (ref 65–99)
Glucose-Capillary: 99 mg/dL (ref 65–99)

## 2017-05-16 LAB — CBC
HCT: 29 % — ABNORMAL LOW (ref 39.0–52.0)
Hemoglobin: 9.2 g/dL — ABNORMAL LOW (ref 13.0–17.0)
MCH: 28 pg (ref 26.0–34.0)
MCHC: 31.7 g/dL (ref 30.0–36.0)
MCV: 88.4 fL (ref 78.0–100.0)
PLATELETS: 508 10*3/uL — AB (ref 150–400)
RBC: 3.28 MIL/uL — ABNORMAL LOW (ref 4.22–5.81)
RDW: 13.9 % (ref 11.5–15.5)
WBC: 13.6 10*3/uL — AB (ref 4.0–10.5)

## 2017-05-16 LAB — TRIGLYCERIDES: TRIGLYCERIDES: 142 mg/dL (ref <150)

## 2017-05-16 SURGERY — CLOSED REDUCTION, FRACTURE, NASAL BONE
Anesthesia: IV Sedation (MBSC Only) | Site: Nose

## 2017-05-16 MED ORDER — PROPOFOL 10 MG/ML IV BOLUS
INTRAVENOUS | Status: DC | PRN
  Administered 2017-05-16: 200 mg via INTRAVENOUS

## 2017-05-16 MED ORDER — DEXMEDETOMIDINE HCL IN NACL 200 MCG/50ML IV SOLN: 0.0000 ug/kg/h | INTRAVENOUS | Status: DC

## 2017-05-16 MED ORDER — METOPROLOL TARTRATE 5 MG/5ML IV SOLN
5.0000 mg | INTRAVENOUS | Status: DC | PRN
  Administered 2017-05-16: 5 mg via INTRAVENOUS
  Filled 2017-05-16: qty 5

## 2017-05-16 MED ORDER — DEXMEDETOMIDINE HCL IN NACL 200 MCG/50ML IV SOLN
0.4000 ug/kg/h | INTRAVENOUS | Status: DC
  Administered 2017-05-16: 0.4 ug/kg/h via INTRAVENOUS
  Administered 2017-05-16: 0.9 ug/kg/h via INTRAVENOUS
  Filled 2017-05-16 (×2): qty 50

## 2017-05-16 MED ORDER — OXYMETAZOLINE HCL 0.05 % NA SOLN
NASAL | Status: DC | PRN
  Administered 2017-05-16: 1

## 2017-05-16 MED ORDER — FENTANYL CITRATE (PF) 100 MCG/2ML IJ SOLN
50.0000 ug | INTRAMUSCULAR | Status: DC | PRN
  Administered 2017-05-16: 100 ug via INTRAVENOUS
  Filled 2017-05-16: qty 2

## 2017-05-16 MED ORDER — RACEPINEPHRINE HCL 2.25 % IN NEBU
0.5000 mL | INHALATION_SOLUTION | RESPIRATORY_TRACT | Status: DC | PRN
  Administered 2017-05-16 (×2): 0.5 mL via RESPIRATORY_TRACT
  Filled 2017-05-16 (×2): qty 0.5

## 2017-05-16 MED ORDER — SUCCINYLCHOLINE CHLORIDE 20 MG/ML IJ SOLN
INTRAMUSCULAR | Status: DC | PRN
  Administered 2017-05-16: 100 mg via INTRAVENOUS

## 2017-05-16 MED ORDER — DEXAMETHASONE SODIUM PHOSPHATE 10 MG/ML IJ SOLN
10.0000 mg | Freq: Two times a day (BID) | INTRAMUSCULAR | Status: DC
  Administered 2017-05-16 – 2017-05-30 (×28): 10 mg via INTRAVENOUS
  Filled 2017-05-16 (×28): qty 1

## 2017-05-16 MED ORDER — DEXMEDETOMIDINE HCL IN NACL 400 MCG/100ML IV SOLN
0.4000 ug/kg/h | INTRAVENOUS | Status: DC
  Administered 2017-05-16: 1.2 ug/kg/h via INTRAVENOUS
  Filled 2017-05-16: qty 100

## 2017-05-16 MED ORDER — FENTANYL BOLUS VIA INFUSION
50.0000 ug | INTRAVENOUS | Status: DC | PRN
  Filled 2017-05-16: qty 50

## 2017-05-16 MED ORDER — OXYMETAZOLINE HCL 0.05 % NA SOLN
NASAL | Status: AC
  Filled 2017-05-16: qty 15

## 2017-05-16 MED ORDER — PROPOFOL 1000 MG/100ML IV EMUL
0.0000 ug/kg/min | INTRAVENOUS | Status: DC
  Administered 2017-05-16: 40 ug/kg/min via INTRAVENOUS
  Administered 2017-05-16 – 2017-05-17 (×2): 20 ug/kg/min via INTRAVENOUS
  Administered 2017-05-17: 40 ug/kg/min via INTRAVENOUS
  Administered 2017-05-17: 30 ug/kg/min via INTRAVENOUS
  Administered 2017-05-18: 25 ug/kg/min via INTRAVENOUS
  Administered 2017-05-18 – 2017-05-19 (×2): 20 ug/kg/min via INTRAVENOUS
  Administered 2017-05-19: 25 ug/kg/min via INTRAVENOUS
  Administered 2017-05-20 – 2017-05-21 (×5): 20 ug/kg/min via INTRAVENOUS
  Administered 2017-05-22 (×2): 10 ug/kg/min via INTRAVENOUS
  Filled 2017-05-16 (×19): qty 100

## 2017-05-16 MED ORDER — FENTANYL CITRATE (PF) 100 MCG/2ML IJ SOLN
50.0000 ug | Freq: Once | INTRAMUSCULAR | Status: AC
  Administered 2017-05-16: 50 ug via INTRAVENOUS

## 2017-05-16 MED ORDER — FENTANYL 2500MCG IN NS 250ML (10MCG/ML) PREMIX INFUSION
25.0000 ug/h | INTRAVENOUS | Status: DC
  Administered 2017-05-16: 250 ug/h via INTRAVENOUS
  Administered 2017-05-17: 175 ug/h via INTRAVENOUS
  Administered 2017-05-17: 300 ug/h via INTRAVENOUS
  Administered 2017-05-17: 200 ug/h via INTRAVENOUS
  Administered 2017-05-18: 100 ug/h via INTRAVENOUS
  Administered 2017-05-19: 150 ug/h via INTRAVENOUS
  Administered 2017-05-19: 100 ug/h via INTRAVENOUS
  Administered 2017-05-20: 200 ug/h via INTRAVENOUS
  Administered 2017-05-20: 150 ug/h via INTRAVENOUS
  Administered 2017-05-21 (×2): 200 ug/h via INTRAVENOUS
  Administered 2017-05-22: 100 ug/h via INTRAVENOUS
  Administered 2017-05-23: 50 ug/h via INTRAVENOUS
  Administered 2017-05-24: 40 ug/h via INTRAVENOUS
  Filled 2017-05-16 (×15): qty 250

## 2017-05-16 SURGICAL SUPPLY — 8 items
BENZOIN TINCTURE PRP APPL 2/3 (GAUZE/BANDAGES/DRESSINGS) ×3 IMPLANT
CLOSURE WOUND 1/2 X4 (GAUZE/BANDAGES/DRESSINGS) ×1
CONT SPEC 4OZ CLIKSEAL STRL BL (MISCELLANEOUS) ×3 IMPLANT
GLOVE BIO SURGEON STRL SZ7.5 (GLOVE) ×3 IMPLANT
GLOVE SURG SS PI 8.0 STRL IVOR (GLOVE) ×3 IMPLANT
PATTIES SURGICAL .5 X3 (DISPOSABLE) ×3 IMPLANT
SPLINT NASAL THERMO PLAST (MISCELLANEOUS) ×3 IMPLANT
STRIP CLOSURE SKIN 1/2X4 (GAUZE/BANDAGES/DRESSINGS) ×2 IMPLANT
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
## 2017-05-16 NOTE — Progress Notes (Signed)
90ml fentanyl wasted in sink w/ Maxine GlennMonica, RN

## 2017-05-16 NOTE — Procedures (Signed)
Preop diagnosis: Nasal fracture Postop diagnosis: same Procedure: Closed nasal reduction Surgeon: Jenne PaneBates Anesth: IV sedation Compl: None Findings: Left nasal bone comminuted.  Left medial canthus stable. Description:  After informed consent was obtained from his daughter, the patient was given additional IV sedation by nursing.  The nasal passages were packed with Afrin-soaked pledgets for several minutes that were then removed.  The nasal bones were then reduced using a Goldman elevator and palpation.  The left nasal bone was straightened mostly.  Pledgets were replaced.  The external nose was coated with Benzoin and Steri-strips were then laid over the external nose.  A Thermoplast splint was cut to fit the nose and then placed in hot water until malleable.  The splint was then laid over the nose and secured into position as it hardened.  He was returned to nursing care in stable condition.  Will leave the splint in place for one week.  It can be reinforced with tape if it becomes loose.  With Dr. [name]' input, the daughter has agreed to leave the frontal fracture alone for the time-being.

## 2017-05-16 NOTE — Procedures (Signed)
Extubation Procedure Note  Patient Details:   Name: Patrick [name] DOB: 1966/02/15 MRN: [id number]   Airway Documentation:     Evaluation  O2 sats: stable throughout Complications: No apparent complications Patient did tolerate procedure well. Bilateral Breath Sounds: Clear, Diminished   No   Pt extubated to 4L N/C.  No stridor noted.  Pt has strong cough.  RN @ bedside.  Christophe LouisSteven D Bradyn Soward 05/16/2017, 11:29 AM

## 2017-05-16 NOTE — Progress Notes (Signed)
NEUROSURGERY PROGRESS NOTE  No new neuro changes.   Temp:  [97.9 F (36.6 C)-99.2 F (37.3 C)] 98.8 F (37.1 C) (12/19 0800) Pulse Rate:  [54-73] 64 (12/19 0856) Resp:  [15-29] 23 (12/19 1100) BP: (107-183)/(53-88) 183/81 (12/19 1100) SpO2:  [92 %-100 %] 94 % (12/19 1100) FiO2 (%):  [40 %] 40 % (12/19 0859) Weight:  [87.4 kg (192 lb 10.9 oz)] 87.4 kg (192 lb 10.9 oz) (12/19 0400)  Plan: Weaning and doing well, resp planning to extubate. No new nsgy recom.  Sherryl MangesKimberly Hannah Maeven Mcdougall, NP 05/16/2017 11:34 AM

## 2017-05-16 NOTE — Progress Notes (Signed)
Follow up - Trauma Critical Care  Patient Details:    Patrick [name] is an 51 y.o. male.  Lines/tubes : Airway 7.5 mm (Active)  Secured at (cm) 24 cm 05/16/2017  8:00 AM  Measured From Lips 05/16/2017  8:00 AM  Secured Location Right 05/16/2017  3:10 AM  Secured By [employer] 05/16/2017  8:00 AM  Tube Holder Repositioned Yes 05/16/2017  3:10 AM  Cuff Pressure (cm H2O) 28 cm H2O 05/16/2017  3:10 AM  Site Condition Dry 05/16/2017  8:00 AM     PICC Triple Lumen 05/08/17 PICC Left Brachial 46 cm 2 cm (Active)  Indication for Insertion or Continuance of Line Limited venous access - need for IV therapy >5 days (PICC only) 05/15/2017  8:00 PM  Exposed Catheter (cm) 2 cm 05/11/2017  8:00 AM  Site Assessment Clean;Dry;Intact 05/15/2017  8:00 PM  Lumen #1 Status In-line blood sampling system in place 05/15/2017  8:00 PM  Lumen #2 Status Infusing 05/15/2017  8:00 PM  Lumen #3 Status Infusing 05/15/2017  8:00 PM  Dressing Type Transparent;Occlusive 05/15/2017  8:00 PM  Dressing Status Clean;Dry;Intact;Antimicrobial disc in place 05/15/2017  8:00 PM  Line Care Connections checked and tightened 05/15/2017  8:00 PM  Dressing Intervention Dressing changed;Antimicrobial disc changed;Securement device changed 05/15/2017  2:00 PM  Dressing Change Due 05/22/17 05/15/2017  2:00 PM     NG/OG Tube Orogastric 18 Fr. Left mouth Xray (Active)  External Length of Tube (cm) - (if applicable) 64 cm 05/15/2017  8:00 PM  Site Assessment Clean;Dry;Intact 05/16/2017  4:00 AM  Ongoing Placement Verification No change in respiratory status;No acute changes, not attributed to clinical condition;No change in cm markings or external length of tube from initial placement 05/16/2017  4:00 AM  Status Infusing tube feed 05/16/2017  4:00 AM  Drainage Appearance Bloody 05/10/2017  8:00 AM  Output (mL) 300 mL 05/14/2017  8:00 PM     External Urinary Catheter (Active)  Collection Container Standard  drainage bag 05/16/2017  4:00 AM  Securement Method Securing device (Describe) 05/16/2017  4:00 AM  Output (mL) 225 mL 05/16/2017  6:00 AM    Microbiology/Sepsis markers: Results for orders placed or performed during the hospital encounter of 05/08/17  Culture, respiratory (NON-Expectorated)     Status: None   Collection Time: 05/11/17 10:40 AM  Result Value Ref Range Status   Specimen Description TRACHEAL ASPIRATE  Final   Special Requests Normal  Final   Gram Stain   Final    ABUNDANT WBC PRESENT, PREDOMINANTLY PMN RARE SQUAMOUS EPITHELIAL CELLS PRESENT ABUNDANT GRAM NEGATIVE COCCOBACILLI FEW GRAM POSITIVE COCCI IN PAIRS IN CLUSTERS FEW GRAM POSITIVE RODS RARE GRAM NEGATIVE COCCI IN PAIRS    Culture   Final    MODERATE STAPHYLOCOCCUS AUREUS ABUNDANT HAEMOPHILUS INFLUENZAE BETA LACTAMASE NEGATIVE    Report Status 05/14/2017 FINAL  Final   Organism ID, Bacteria STAPHYLOCOCCUS AUREUS  Final      Susceptibility   Staphylococcus aureus - MIC*    CIPROFLOXACIN <=0.5 SENSITIVE Sensitive     ERYTHROMYCIN RESISTANT Resistant     GENTAMICIN <=0.5 SENSITIVE Sensitive     OXACILLIN <=0.25 SENSITIVE Sensitive     TETRACYCLINE <=1 SENSITIVE Sensitive     VANCOMYCIN <=0.5 SENSITIVE Sensitive     TRIMETH/SULFA <=10 SENSITIVE Sensitive     CLINDAMYCIN RESISTANT Resistant     RIFAMPIN <=0.5 SENSITIVE Sensitive     Inducible Clindamycin POSITIVE Resistant     * MODERATE STAPHYLOCOCCUS AUREUS  Anti-infectives:  Anti-infectives (From admission, onward)   Start     Dose/Rate Route Frequency Ordered Stop   05/15/17 0900  ceFAZolin (ANCEF) IVPB 1 g/50 mL premix     1 g 100 mL/hr over 30 Minutes Intravenous Every 8 hours 05/15/17 0822     05/13/17 0800  vancomycin (VANCOCIN) 1,500 mg in sodium chloride 0.9 % 500 mL IVPB  Status:  Discontinued     1,500 mg 250 mL/hr over 120 Minutes Intravenous Every 8 hours 05/13/17 0439 05/15/17 0828   05/11/17 1130  piperacillin-tazobactam (ZOSYN)  IVPB 3.375 g  Status:  Discontinued     3.375 g 12.5 mL/hr over 240 Minutes Intravenous Every 8 hours 05/11/17 1101 05/15/17 0828   05/11/17 1130  vancomycin (VANCOCIN) IVPB 1000 mg/200 mL premix  Status:  Discontinued     1,000 mg 200 mL/hr over 60 Minutes Intravenous Every 8 hours 05/11/17 1101 05/13/17 0439   05/08/17 2300  ceFAZolin (ANCEF) IVPB 1 g/50 mL premix     1 g 100 mL/hr over 30 Minutes Intravenous Every 8 hours 05/08/17 1548 05/09/17 1517   05/08/17 1403  vancomycin (VANCOCIN) powder  Status:  Discontinued       As needed 05/08/17 1403 05/08/17 1521      Best Practice/Protocols:  VTE Prophylaxis: Lovenox (prophylaxtic dose) Continous Sedation  Consults: Treatment Team:  Tia AlertJones, David S, MD Roby LoftsHaddix, Kevin P, MD    Studies:    Events:  Subjective:    Overnight Issues:   Objective:  Vital signs for last 24 hours: Temp:  [97.9 F (36.6 C)-99.2 F (37.3 C)] 98.8 F (37.1 C) (12/19 0800) Pulse Rate:  [53-73] 70 (12/19 0800) Resp:  [15-29] 26 (12/19 0800) BP: (107-179)/(53-93) 132/56 (12/19 0800) SpO2:  [91 %-100 %] 93 % (12/19 0800) FiO2 (%):  [40 %] 40 % (12/19 0310) Weight:  [87.4 kg (192 lb 10.9 oz)] 87.4 kg (192 lb 10.9 oz) (12/19 0400)  Hemodynamic parameters for last 24 hours:    Intake/Output from previous day: 12/18 0701 - 12/19 0700 In: 3163.3 [I.V.:1553.3; NG/GT:960; IV Piggyback:650] Out: 3725 [Urine:3725]  Intake/Output this shift: Total I/O In: 212 [I.V.:72; NG/GT:40; IV Piggyback:100] Out: -   Vent settings for last 24 hours: Vent Mode: PRVC FiO2 (%):  [40 %] 40 % Set Rate:  [26 bmp] 26 bmp Vt Set:  [580 mL] 580 mL PEEP:  [5 cmH20] 5 cmH20 Plateau Pressure:  [18 cmH20-28 cmH20] 23 cmH20  Physical Exam:  General: on vent Neuro: sedated from procedure HEENT/Neck: ETT and new nasal splint Resp: clear to auscultation bilaterally CVS: RRR GI: soft, NT, +BS  Results for orders placed or performed during the hospital encounter of  05/08/17 (from the past 24 hour(s))  Glucose, capillary     Status: Abnormal   Collection Time: 05/15/17 12:21 PM  Result Value Ref Range   Glucose-Capillary 139 (H) 65 - 99 mg/dL   Comment 1 Notify RN    Comment 2 Document in Chart   Glucose, capillary     Status: Abnormal   Collection Time: 05/15/17  3:50 PM  Result Value Ref Range   Glucose-Capillary 155 (H) 65 - 99 mg/dL   Comment 1 Notify RN    Comment 2 Document in Chart   Glucose, capillary     Status: Abnormal   Collection Time: 05/15/17  8:13 PM  Result Value Ref Range   Glucose-Capillary 101 (H) 65 - 99 mg/dL  Glucose, capillary     Status: Abnormal  Collection Time: 05/15/17 11:55 PM  Result Value Ref Range   Glucose-Capillary 118 (H) 65 - 99 mg/dL  Glucose, capillary     Status: Abnormal   Collection Time: 05/16/17  3:57 AM  Result Value Ref Range   Glucose-Capillary 108 (H) 65 - 99 mg/dL  CBC     Status: Abnormal   Collection Time: 05/16/17  4:50 AM  Result Value Ref Range   WBC 13.6 (H) 4.0 - 10.5 K/uL   RBC 3.28 (L) 4.22 - 5.81 MIL/uL   Hemoglobin 9.2 (L) 13.0 - 17.0 g/dL   HCT 16.129.0 (L) 09.639.0 - 04.552.0 %   MCV 88.4 78.0 - 100.0 fL   MCH 28.0 26.0 - 34.0 pg   MCHC 31.7 30.0 - 36.0 g/dL   RDW 40.913.9 81.111.5 - 91.415.5 %   Platelets 508 (H) 150 - 400 K/uL  Basic metabolic panel     Status: Abnormal   Collection Time: 05/16/17  4:50 AM  Result Value Ref Range   Sodium 138 135 - 145 mmol/L   Potassium 3.8 3.5 - 5.1 mmol/L   Chloride 105 101 - 111 mmol/L   CO2 23 22 - 32 mmol/L   Glucose, Bld 95 65 - 99 mg/dL   BUN 21 (H) 6 - 20 mg/dL   Creatinine, Ser 7.820.57 (L) 0.61 - 1.24 mg/dL   Calcium 8.4 (L) 8.9 - 10.3 mg/dL   GFR calc non Af Amer >60 >60 mL/min   GFR calc Af Amer >60 >60 mL/min   Anion gap 10 5 - 15    Assessment & Plan: Present on Admission: . Depressed skull fracture (HCC)    LOS: 8 days   Additional comments:I reviewed the patient's new clinical lab test results. Marland Kitchen. PHBC TBI/B frontal ICC/depressed  frontal bone FX - F/C for me 12/19 (sedated for procedure now), F/U CT head done and is improving per Dr. [name] B orbit/ethmoid/crib plate/nasal FXs - Dr. Jenne PaneBates did bedside CR nasal FX 12/19, Dr. [name] following R BB FA FX - S/P ORIF by Dr. [name] 12/11 Acute hypercarbic vent dependent resp failure - weaning and likely extubation today PSA - positive for meth and THC, CSW eval once extubated ID - OSSA HCAP - ancef 2/7 R knee edema - further ortho eval once off vent R foot x-ray neg VTE - PAS FEN - TF, give another lasix dose Dispo - ICU. Critical Care Total Time*: 32 Minutes  Violeta GelinasBurke Hedda Crumbley, MD, MPH, [facility] Tra[facility]: [phone] General Surgery: [phone]  05/16/2017  *Care during the described time interval was provided by me. I have reviewed this patient's available data, including medical history, events of note, physical examination and test results as part of my evaluation.  Patient ID: Patrick [name], male   DOB: 06/15/1965, 51 y.o.   MRN: [id number]

## 2017-05-16 NOTE — Progress Notes (Signed)
This RN noted increased WOB, tachypnea (RR 35-40). Tachycardia (hr 115). Paged Trauma MD. Requested RN page anesthesia to reintubate. Anesthesia at bedside.
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
## 2017-05-16 NOTE — Progress Notes (Signed)
SBP 190-215. Dr Janee Mornhompson at bedside and aware. See assoc orders.

## 2017-05-17 ENCOUNTER — Encounter (HOSPITAL_COMMUNITY): Payer: Self-pay | Admitting: Otolaryngology

## 2017-05-17 ENCOUNTER — Inpatient Hospital Stay (HOSPITAL_COMMUNITY): Payer: No Typology Code available for payment source

## 2017-05-17 LAB — CBC
HEMATOCRIT: 28.5 % — AB (ref 39.0–52.0)
Hemoglobin: 9.4 g/dL — ABNORMAL LOW (ref 13.0–17.0)
MCH: 28.6 pg (ref 26.0–34.0)
MCHC: 33 g/dL (ref 30.0–36.0)
MCV: 86.6 fL (ref 78.0–100.0)
Platelets: 553 10*3/uL — ABNORMAL HIGH (ref 150–400)
RBC: 3.29 MIL/uL — AB (ref 4.22–5.81)
RDW: 14 % (ref 11.5–15.5)
WBC: 11.8 10*3/uL — AB (ref 4.0–10.5)

## 2017-05-17 LAB — BASIC METABOLIC PANEL
Anion gap: 8 (ref 5–15)
BUN: 22 mg/dL — AB (ref 6–20)
CHLORIDE: 109 mmol/L (ref 101–111)
CO2: 21 mmol/L — AB (ref 22–32)
Calcium: 8.5 mg/dL — ABNORMAL LOW (ref 8.9–10.3)
Creatinine, Ser: 0.62 mg/dL (ref 0.61–1.24)
GFR calc Af Amer: >60 mL/min (ref >60)
GFR calc non Af Amer: >60 mL/min (ref >60)
Glucose, Bld: 124 mg/dL — ABNORMAL HIGH (ref 65–99)
POTASSIUM: 4 mmol/L (ref 3.5–5.1)
SODIUM: 138 mmol/L (ref 135–145)

## 2017-05-17 LAB — GLUCOSE, CAPILLARY
GLUCOSE-CAPILLARY: 126 mg/dL — AB (ref 65–99)
GLUCOSE-CAPILLARY: 134 mg/dL — AB (ref 65–99)
Glucose-Capillary: 114 mg/dL — ABNORMAL HIGH (ref 65–99)
Glucose-Capillary: 130 mg/dL — ABNORMAL HIGH (ref 65–99)
Glucose-Capillary: 152 mg/dL — ABNORMAL HIGH (ref 65–99)
Glucose-Capillary: 119 mg/dL — ABNORMAL HIGH (ref 65–99)

## 2017-05-17 LAB — MAGNESIUM
Magnesium: 2.2 mg/dL (ref 1.7–2.4)
Magnesium: 2.3 mg/dL (ref 1.7–2.4)

## 2017-05-17 LAB — TRIGLYCERIDES: TRIGLYCERIDES: 123 mg/dL (ref <150)

## 2017-05-17 LAB — PHOSPHORUS
Phosphorus: 4.4 mg/dL (ref 2.5–4.6)
Phosphorus: 3.6 mg/dL (ref 2.5–4.6)

## 2017-05-17 MED ORDER — FUROSEMIDE 10 MG/ML IJ SOLN
40.0000 mg | Freq: Once | INTRAMUSCULAR | Status: AC
  Administered 2017-05-17: 40 mg via INTRAVENOUS
  Filled 2017-05-17: qty 4

## 2017-05-17 MED ORDER — VITAL HIGH PROTEIN PO LIQD: 1000.0000 mL | ORAL | Status: DC

## 2017-05-17 MED ORDER — ALTEPLASE 2 MG IJ SOLR
2.0000 mg | Freq: Once | INTRAMUSCULAR | Status: AC
  Administered 2017-05-17: 2 mg

## 2017-05-17 MED ORDER — PRO-STAT SUGAR FREE PO LIQD
30.0000 mL | Freq: Two times a day (BID) | ORAL | Status: DC
  Administered 2017-05-17 – 2017-05-24 (×14): 30 mL
  Filled 2017-05-17 (×14): qty 30

## 2017-05-17 NOTE — Plan of Care (Signed)
Will continue q1hr neuro checks, q2hr turns, and tube feedings.
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
## 2017-05-17 NOTE — Progress Notes (Signed)
Patient ID: Patrick [name] Patrick [name], male   DOB: [birth year], 51 y.o.   MRN: [id number] Pt reintubated and sedated, perrl, no deformity at fracture site, following
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
## 2017-05-17 NOTE — Progress Notes (Signed)
Patient ID: Patrick [name], male   DOB: 1966/03/24, 51 y.o.   MRN: [id number] Follow up - Trauma Critical Care  Patient Details:    Patrick [name] is an 51 y.o. male.  Lines/tubes : Airway 7.5 mm (Active)  Secured at (cm) 25 cm 05/17/2017  4:56 AM  Measured From Lips 05/17/2017  4:56 AM  Secured Location Center 05/17/2017  4:56 AM  Secured By [employer] Tube Holder 05/17/2017  4:56 AM  Tube Holder Repositioned Yes 05/17/2017  4:56 AM  Cuff Pressure (cm H2O) 28 cm H2O 05/17/2017  4:56 AM  Site Condition Dry 05/17/2017  4:56 AM     PICC Triple Lumen 05/08/17 PICC Left Brachial 46 cm 2 cm (Active)  Indication for Insertion or Continuance of Line Prolonged intravenous therapies;Limited venous access - need for IV therapy >5 days (PICC only) 05/16/2017  8:00 PM  Exposed Catheter (cm) 2 cm 05/16/2017  8:00 AM  Site Assessment Clean;Dry;Intact 05/16/2017  8:00 PM  Lumen #1 Status Flushed;Blood return noted;In-line blood sampling system in place 05/16/2017  8:00 PM  Lumen #2 Status Infusing 05/16/2017  8:00 PM  Lumen #3 Status Infusing 05/16/2017  8:00 PM  Dressing Type Transparent;Securing device 05/16/2017  8:00 PM  Dressing Status Clean;Dry;Intact;Antimicrobial disc in place 05/16/2017  8:00 PM  Line Care Connections checked and tightened 05/16/2017  8:00 PM  Dressing Intervention Dressing changed;Antimicrobial disc changed;Securement device changed 05/15/2017  2:00 PM  Dressing Change Due 05/22/17 05/16/2017  8:00 PM     NG/OG Tube Orogastric Center mouth Xray (Active)  Site Assessment Clean;Dry;Intact 05/16/2017  8:00 PM  Ongoing Placement Verification No change in respiratory status;No acute changes, not attributed to clinical condition;Xray 05/16/2017  8:00 PM  Status Suction-low intermittent 05/16/2017  8:00 PM  Drainage Appearance Bile 05/16/2017  8:00 PM  Output (mL) 550 mL 05/17/2017  7:00 AM     External Urinary Catheter (Active)  Collection Container Standard  drainage bag 05/16/2017  8:00 PM  Securement Method Securing device (Describe) 05/16/2017  8:00 PM  Output (mL) 200 mL 05/16/2017 11:04 PM    Microbiology/Sepsis markers: Results for orders placed or performed during the hospital encounter of 05/08/17  Culture, respiratory (NON-Expectorated)     Status: None   Collection Time: 05/11/17 10:40 AM  Result Value Ref Range Status   Specimen Description TRACHEAL ASPIRATE  Final   Special Requests Normal  Final   Gram Stain   Final    ABUNDANT WBC PRESENT, PREDOMINANTLY PMN RARE SQUAMOUS EPITHELIAL CELLS PRESENT ABUNDANT GRAM NEGATIVE COCCOBACILLI FEW GRAM POSITIVE COCCI IN PAIRS IN CLUSTERS FEW GRAM POSITIVE RODS RARE GRAM NEGATIVE COCCI IN PAIRS    Culture   Final    MODERATE STAPHYLOCOCCUS AUREUS ABUNDANT HAEMOPHILUS INFLUENZAE BETA LACTAMASE NEGATIVE    Report Status 05/14/2017 FINAL  Final   Organism ID, Bacteria STAPHYLOCOCCUS AUREUS  Final      Susceptibility   Staphylococcus aureus - MIC*    CIPROFLOXACIN <=0.5 SENSITIVE Sensitive     ERYTHROMYCIN RESISTANT Resistant     GENTAMICIN <=0.5 SENSITIVE Sensitive     OXACILLIN <=0.25 SENSITIVE Sensitive     TETRACYCLINE <=1 SENSITIVE Sensitive     VANCOMYCIN <=0.5 SENSITIVE Sensitive     TRIMETH/SULFA <=10 SENSITIVE Sensitive     CLINDAMYCIN RESISTANT Resistant     RIFAMPIN <=0.5 SENSITIVE Sensitive     Inducible Clindamycin POSITIVE Resistant     * MODERATE STAPHYLOCOCCUS AUREUS    Anti-infectives:  Anti-infectives (From admission, onward)   Start  Dose/Rate Route Frequency Ordered Stop   05/15/17 0900  ceFAZolin (ANCEF) IVPB 1 g/50 mL premix     1 g 100 mL/hr over 30 Minutes Intravenous Every 8 hours 05/15/17 0822     05/13/17 0800  vancomycin (VANCOCIN) 1,500 mg in sodium chloride 0.9 % 500 mL IVPB  Status:  Discontinued     1,500 mg 250 mL/hr over 120 Minutes Intravenous Every 8 hours 05/13/17 0439 05/15/17 0828   05/11/17 1130  piperacillin-tazobactam (ZOSYN)  IVPB 3.375 g  Status:  Discontinued     3.375 g 12.5 mL/hr over 240 Minutes Intravenous Every 8 hours 05/11/17 1101 05/15/17 0828   05/11/17 1130  vancomycin (VANCOCIN) IVPB 1000 mg/200 mL premix  Status:  Discontinued     1,000 mg 200 mL/hr over 60 Minutes Intravenous Every 8 hours 05/11/17 1101 05/13/17 0439   05/08/17 2300  ceFAZolin (ANCEF) IVPB 1 g/50 mL premix     1 g 100 mL/hr over 30 Minutes Intravenous Every 8 hours 05/08/17 1548 05/09/17 1517   05/08/17 1403  vancomycin (VANCOCIN) powder  Status:  Discontinued       As needed 05/08/17 1403 05/08/17 1521      Best Practice/Protocols:  VTE Prophylaxis: Mechanical Continous Sedation  Consults: Treatment Team:  Tia Alert, MD Haddix, Gillie Manners, MD    Studies:    Events:  Subjective:    Overnight Issues:   Objective:  Vital signs for last 24 hours: Temp:  [97.7 F (36.5 C)-100.7 F (38.2 C)] 97.7 F (36.5 C) (12/20 0730) Pulse Rate:  [59-81] 81 (12/20 0456) Resp:  [19-36] 32 (12/20 0730) BP: (87-211)/(53-96) 115/70 (12/20 0730) SpO2:  [91 %-100 %] 100 % (12/20 0730) FiO2 (%):  [40 %] 40 % (12/20 0456) Weight:  [82.5 kg (181 lb 14.1 oz)] 82.5 kg (181 lb 14.1 oz) (12/20 0300)  Hemodynamic parameters for last 24 hours:    Intake/Output from previous day: 12/19 0701 - 12/20 0700 In: 2390.1 [I.V.:2048.1; NG/GT:92; IV Piggyback:250] Out: 3700 [Urine:3150; Emesis/NG output:550]  Intake/Output this shift: No intake/output data recorded.  Vent settings for last 24 hours: Vent Mode: PRVC FiO2 (%):  [40 %] 40 % Set Rate:  [26 bmp] 26 bmp Vt Set:  [580 mL] 580 mL PEEP:  [5 cmH20] 5 cmH20 Pressure Support:  [10 cmH20] 10 cmH20 Plateau Pressure:  [14 cmH20-22 cmH20] 19 cmH20  Physical Exam:  General: on vent Neuro: PERL sedated now HEENT/Neck: ETT and forehead deformity Resp: clear to auscultation bilaterally CVS: RRR GI: soft, NT, +BS .  Results for orders placed or performed during the hospital  encounter of 05/08/17 (from the past 24 hour(s))  Glucose, capillary     Status: None   Collection Time: 05/16/17  8:35 AM  Result Value Ref Range   Glucose-Capillary 93 65 - 99 mg/dL  Glucose, capillary     Status: Abnormal   Collection Time: 05/16/17 11:54 AM  Result Value Ref Range   Glucose-Capillary 108 (H) 65 - 99 mg/dL   Comment 1 Notify RN    Comment 2 Document in Chart   Glucose, capillary     Status: Abnormal   Collection Time: 05/16/17  3:52 PM  Result Value Ref Range   Glucose-Capillary 118 (H) 65 - 99 mg/dL   Comment 1 Notify RN    Comment 2 Document in Chart   Glucose, capillary     Status: None   Collection Time: 05/16/17  8:08 PM  Result Value Ref Range  Glucose-Capillary 99 65 - 99 mg/dL  Triglycerides     Status: None   Collection Time: 05/16/17 10:09 PM  Result Value Ref Range   Triglycerides 142 <150 mg/dL  Glucose, capillary     Status: Abnormal   Collection Time: 05/16/17 11:49 PM  Result Value Ref Range   Glucose-Capillary 106 (H) 65 - 99 mg/dL  Glucose, capillary     Status: Abnormal   Collection Time: 05/17/17  3:57 AM  Result Value Ref Range   Glucose-Capillary 126 (H) 65 - 99 mg/dL  Basic metabolic panel     Status: Abnormal   Collection Time: 05/17/17  5:39 AM  Result Value Ref Range   Sodium 138 135 - 145 mmol/L   Potassium 4.0 3.5 - 5.1 mmol/L   Chloride 109 101 - 111 mmol/L   CO2 21 (L) 22 - 32 mmol/L   Glucose, Bld 124 (H) 65 - 99 mg/dL   BUN 22 (H) 6 - 20 mg/dL   Creatinine, Ser 1.610.62 0.61 - 1.24 mg/dL   Calcium 8.5 (L) 8.9 - 10.3 mg/dL   GFR calc non Af Amer >60 >60 mL/min   GFR calc Af Amer >60 >60 mL/min   Anion gap 8 5 - 15  CBC     Status: Abnormal   Collection Time: 05/17/17  5:39 AM  Result Value Ref Range   WBC 11.8 (H) 4.0 - 10.5 K/uL   RBC 3.29 (L) 4.22 - 5.81 MIL/uL   Hemoglobin 9.4 (L) 13.0 - 17.0 g/dL   HCT 09.628.5 (L) 04.539.0 - 40.952.0 %   MCV 86.6 78.0 - 100.0 fL   MCH 28.6 26.0 - 34.0 pg   MCHC 33.0 30.0 - 36.0 g/dL    RDW 81.114.0 91.411.5 - 78.215.5 %   Platelets 553 (H) 150 - 400 K/uL  Triglycerides     Status: None   Collection Time: 05/17/17  5:39 AM  Result Value Ref Range   Triglycerides 123 <150 mg/dL  Glucose, capillary     Status: Abnormal   Collection Time: 05/17/17  7:36 AM  Result Value Ref Range   Glucose-Capillary 114 (H) 65 - 99 mg/dL    Assessment & Plan: Present on Admission: . Depressed skull fracture (HCC)    LOS: 9 days   Additional comments:I reviewed the patient's new clinical lab test results. and CXR PHBC TBI/B frontal ICC/depressed frontal bone FX - F/C for me 12/19 (sedated for procedure now), F/U CT head done and is improving per Dr. [name] B orbit/ethmoid/crib plate/nasal FXs - Dr. Jenne PaneBates did bedside CR nasal FX 12/19, Dr. [name] following R BB FA FX - S/P ORIF by Dr. [name] 12/11 Acute hypercarbic vent dependent resp failure - failed extubation yesterday, was increased WOB as opposed to stridor per staff, resume weaning and hope to try again in a couple days PSA - positive for meth and THC, CSW eval once extubated ID - OSSA HCAP - ancef 3/7 R knee edema - further ortho eval once off vent R foot x-ray neg VTE - PAS FEN - TF, give another lasix dose now, K better Dispo - ICU. Critical Care Total Time*: 4333 Minutes  Violeta GelinasBurke Jackqueline Aquilar, MD, MPH, [facility] Trauma: [phone] General Surgery: [phone]  05/17/2017  *Care during the described time interval was provided by me. I have reviewed this patient's available data, including medical history, events of note, physical examination and test results as part of my evaluation.

## 2017-05-17 NOTE — Progress Notes (Signed)
   Subjective:    Patient ID: Patrick [name], male    DOB: 1966-05-03, 51 y.o.   MRN: [id number]  HPI Was extubated yesterday but required reintubation last night due to labored breathing.  Review of Systems     Objective:   Physical Exam Intubated and sedated. Right frontal stepoff palpable.  Nasal splint in place.  Periorbital edema and ecchymosis slowly improving.    Assessment & Plan:  Frontal, NOE, and nasal fractures Nasal splint remains in place.  Will plan to leave on for one week.  Continued conservative management of frontal/NOE fracture otherwise.

## 2017-05-18 LAB — BASIC METABOLIC PANEL
ANION GAP: 7 (ref 5–15)
BUN: 31 mg/dL — ABNORMAL HIGH (ref 6–20)
CALCIUM: 8.4 mg/dL — AB (ref 8.9–10.3)
CO2: 23 mmol/L (ref 22–32)
CREATININE: 0.66 mg/dL (ref 0.61–1.24)
Chloride: 108 mmol/L (ref 101–111)
GFR calc Af Amer: >60 mL/min (ref >60)
GFR calc non Af Amer: >60 mL/min (ref >60)
GLUCOSE: 154 mg/dL — AB (ref 65–99)
Potassium: 3.8 mmol/L (ref 3.5–5.1)
Sodium: 138 mmol/L (ref 135–145)

## 2017-05-18 LAB — GLUCOSE, CAPILLARY
GLUCOSE-CAPILLARY: 154 mg/dL — AB (ref 65–99)
GLUCOSE-CAPILLARY: 115 mg/dL — AB (ref 65–99)
Glucose-Capillary: 148 mg/dL — ABNORMAL HIGH (ref 65–99)
Glucose-Capillary: 138 mg/dL — ABNORMAL HIGH (ref 65–99)
Glucose-Capillary: 120 mg/dL — ABNORMAL HIGH (ref 65–99)
Glucose-Capillary: 95 mg/dL (ref 65–99)

## 2017-05-18 LAB — PHOSPHORUS
Phosphorus: 3.9 mg/dL (ref 2.5–4.6)
Phosphorus: 3.4 mg/dL (ref 2.5–4.6)

## 2017-05-18 LAB — CBC
HEMATOCRIT: 29.6 % — AB (ref 39.0–52.0)
Hemoglobin: 9.5 g/dL — ABNORMAL LOW (ref 13.0–17.0)
MCH: 28.5 pg (ref 26.0–34.0)
MCHC: 32.1 g/dL (ref 30.0–36.0)
MCV: 88.9 fL (ref 78.0–100.0)
Platelets: 676 10*3/uL — ABNORMAL HIGH (ref 150–400)
RBC: 3.33 MIL/uL — ABNORMAL LOW (ref 4.22–5.81)
RDW: 14.3 % (ref 11.5–15.5)
WBC: 13.1 10*3/uL — ABNORMAL HIGH (ref 4.0–10.5)

## 2017-05-18 LAB — MAGNESIUM
MAGNESIUM: 2.2 mg/dL (ref 1.7–2.4)
Magnesium: 2.1 mg/dL (ref 1.7–2.4)

## 2017-05-18 NOTE — Progress Notes (Signed)
Patient ID: Patrick [name], male   DOB: [birth year], 51 y.o.   MRN: [id number] I met with his family at the bedside and updated them on the plan of care. Georganna Skeans, MD, MPH, FACS Trauma: [phone] General Surgery: [phone]

## 2017-05-18 NOTE — Plan of Care (Signed)
Patient seems is able to rest with sedation.

## 2017-05-18 NOTE — Progress Notes (Signed)
Patient ID: Patrick MutaDoyle Dean Gavia, male   DOB: 10-19-1965, 51 y.o.   MRN: [id number] Follow up - Trauma Critical Care  Patient Details:    Patrick [name] is an 51 y.o. male.  Lines/tubes : Airway 7.5 mm (Active)  Secured at (cm) 25 cm 05/18/2017  7:47 AM  Measured From Lips 05/18/2017  7:47 AM  Secured Location Center 05/18/2017  7:47 AM  Secured By [employer] Tube Holder 05/18/2017  7:47 AM  Tube Holder Repositioned Yes 05/18/2017  7:47 AM  Cuff Pressure (cm H2O) 24 cm H2O 05/18/2017  7:47 AM  Site Condition Dry 05/18/2017  7:47 AM     PICC Triple Lumen 05/08/17 PICC Left Brachial 46 cm 2 cm (Active)  Indication for Insertion or Continuance of Line Vasoactive infusions 05/18/2017  7:37 AM  Exposed Catheter (cm) 2 cm 05/17/2017 10:18 AM  Site Assessment Intact;Dry;Clean 05/17/2017  8:00 PM  Lumen #1 Status Infusing 05/17/2017  8:00 PM  Lumen #2 Status Infusing 05/17/2017  8:00 PM  Lumen #3 Status Flushed;Blood return noted;Cap changed 05/17/2017  8:00 PM  Dressing Type Transparent 05/17/2017  8:00 PM  Dressing Status Clean;Dry;Antimicrobial disc in place;Intact 05/17/2017  8:00 PM  Line Care Connections checked and tightened 05/17/2017 10:18 AM  Dressing Intervention Dressing changed 05/17/2017  8:00 AM  Dressing Change Due 05/22/17 05/17/2017  8:00 PM     NG/OG Tube Orogastric Center mouth Xray (Active)  Site Assessment Clean;Dry;Intact 05/17/2017  8:00 PM  Ongoing Placement Verification No change in respiratory status;No acute changes, not attributed to clinical condition;Xray 05/17/2017  8:00 PM  Status Suction-low intermittent 05/17/2017  8:00 PM  Drainage Appearance Bile 05/17/2017  8:00 AM  Output (mL) 550 mL 05/17/2017  7:00 AM     External Urinary Catheter (Active)  Collection Container Standard drainage bag 05/17/2017  8:00 PM  Securement Method Tape 05/17/2017  8:00 AM  Output (mL) 125 mL 05/18/2017  8:00 AM    Microbiology/Sepsis markers: Results for orders  placed or performed during the hospital encounter of 05/08/17  Culture, respiratory (NON-Expectorated)     Status: None   Collection Time: 05/11/17 10:40 AM  Result Value Ref Range Status   Specimen Description TRACHEAL ASPIRATE  Final   Special Requests Normal  Final   Gram Stain   Final    ABUNDANT WBC PRESENT, PREDOMINANTLY PMN RARE SQUAMOUS EPITHELIAL CELLS PRESENT ABUNDANT GRAM NEGATIVE COCCOBACILLI FEW GRAM POSITIVE COCCI IN PAIRS IN CLUSTERS FEW GRAM POSITIVE RODS RARE GRAM NEGATIVE COCCI IN PAIRS    Culture   Final    MODERATE STAPHYLOCOCCUS AUREUS ABUNDANT HAEMOPHILUS INFLUENZAE BETA LACTAMASE NEGATIVE    Report Status 05/14/2017 FINAL  Final   Organism ID, Bacteria STAPHYLOCOCCUS AUREUS  Final      Susceptibility   Staphylococcus aureus - MIC*    CIPROFLOXACIN <=0.5 SENSITIVE Sensitive     ERYTHROMYCIN RESISTANT Resistant     GENTAMICIN <=0.5 SENSITIVE Sensitive     OXACILLIN <=0.25 SENSITIVE Sensitive     TETRACYCLINE <=1 SENSITIVE Sensitive     VANCOMYCIN <=0.5 SENSITIVE Sensitive     TRIMETH/SULFA <=10 SENSITIVE Sensitive     CLINDAMYCIN RESISTANT Resistant     RIFAMPIN <=0.5 SENSITIVE Sensitive     Inducible Clindamycin POSITIVE Resistant     * MODERATE STAPHYLOCOCCUS AUREUS    Anti-infectives:  Anti-infectives (From admission, onward)   Start     Dose/Rate Route Frequency Ordered Stop   05/15/17 0900  ceFAZolin (ANCEF) IVPB 1 g/50 mL premix  1 g 100 mL/hr over 30 Minutes Intravenous Every 8 hours 05/15/17 0822     05/13/17 0800  vancomycin (VANCOCIN) 1,500 mg in sodium chloride 0.9 % 500 mL IVPB  Status:  Discontinued     1,500 mg 250 mL/hr over 120 Minutes Intravenous Every 8 hours 05/13/17 0439 05/15/17 0828   05/11/17 1130  piperacillin-tazobactam (ZOSYN) IVPB 3.375 g  Status:  Discontinued     3.375 g 12.5 mL/hr over 240 Minutes Intravenous Every 8 hours 05/11/17 1101 05/15/17 0828   05/11/17 1130  vancomycin (VANCOCIN) IVPB 1000 mg/200 mL premix   Status:  Discontinued     1,000 mg 200 mL/hr over 60 Minutes Intravenous Every 8 hours 05/11/17 1101 05/13/17 0439   05/08/17 2300  ceFAZolin (ANCEF) IVPB 1 g/50 mL premix     1 g 100 mL/hr over 30 Minutes Intravenous Every 8 hours 05/08/17 1548 05/09/17 1517   05/08/17 1403  vancomycin (VANCOCIN) powder  Status:  Discontinued       As needed 05/08/17 1403 05/08/17 1521      Best Practice/Protocols:  VTE Prophylaxis: Mechanical lovenox Continous Sedation  Consults: Treatment Team:  Tia AlertJones, David S, MD Haddix, Gillie MannersKevin P, MD    Studies:    Events:  Subjective:    Overnight Issues:   Objective:  Vital signs for last 24 hours: Temp:  [97.5 F (36.4 C)-98.4 F (36.9 C)] 98.4 F (36.9 C) (12/21 0800) Pulse Rate:  [56-71] 71 (12/21 0800) Resp:  [14-29] 14 (12/21 0747) BP: (97-121)/(55-95) 115/71 (12/21 0800) SpO2:  [92 %-99 %] 99 % (12/21 0800) FiO2 (%):  [40 %] 40 % (12/21 0800) Weight:  [82.5 kg (181 lb 14.1 oz)] 82.5 kg (181 lb 14.1 oz) (12/21 0248)  Hemodynamic parameters for last 24 hours:    Intake/Output from previous day: 12/20 0701 - 12/21 0700 In: 2383.1 [I.V.:1773.1; NG/GT:360; IV Piggyback:250] Out: 2850 [Urine:2850]  Intake/Output this shift: Total I/O In: 230.5 [I.V.:70.5; NG/GT:160] Out: 125 [Urine:125]  Vent settings for last 24 hours: Vent Mode: PSV;CPAP FiO2 (%):  [40 %] 40 % Set Rate:  [26 bmp] 26 bmp Vt Set:  [580 mL] 580 mL PEEP:  [5 cmH20] 5 cmH20 Pressure Support:  [5 cmH20] 5 cmH20 Plateau Pressure:  [17 cmH20-22 cmH20] 17 cmH20  Physical Exam:  General: on vent Neuro: arouses and F/C, PERL HEENT/Neck: ETT Resp: clear to auscultation bilaterally CVS: RRR GI: soft, NT  Results for orders placed or performed during the hospital encounter of 05/08/17 (from the past 24 hour(s))  Glucose, capillary     Status: Abnormal   Collection Time: 05/17/17 11:28 AM  Result Value Ref Range   Glucose-Capillary 130 (H) 65 - 99 mg/dL  Glucose,  capillary     Status: Abnormal   Collection Time: 05/17/17  3:47 PM  Result Value Ref Range   Glucose-Capillary 152 (H) 65 - 99 mg/dL  Magnesium     Status: None   Collection Time: 05/17/17  5:01 PM  Result Value Ref Range   Magnesium 2.3 1.7 - 2.4 mg/dL  Phosphorus     Status: None   Collection Time: 05/17/17  5:01 PM  Result Value Ref Range   Phosphorus 3.6 2.5 - 4.6 mg/dL  Glucose, capillary     Status: Abnormal   Collection Time: 05/17/17  7:53 PM  Result Value Ref Range   Glucose-Capillary 119 (H) 65 - 99 mg/dL  Glucose, capillary     Status: Abnormal   Collection Time: 05/17/17 11:45 PM  Result Value Ref Range   Glucose-Capillary 134 (H) 65 - 99 mg/dL  Glucose, capillary     Status: Abnormal   Collection Time: 05/18/17  3:31 AM  Result Value Ref Range   Glucose-Capillary 148 (H) 65 - 99 mg/dL  CBC     Status: Abnormal   Collection Time: 05/18/17  5:46 AM  Result Value Ref Range   WBC 13.1 (H) 4.0 - 10.5 K/uL   RBC 3.33 (L) 4.22 - 5.81 MIL/uL   Hemoglobin 9.5 (L) 13.0 - 17.0 g/dL   HCT 08.6 (L) 57.8 - 46.9 %   MCV 88.9 78.0 - 100.0 fL   MCH 28.5 26.0 - 34.0 pg   MCHC 32.1 30.0 - 36.0 g/dL   RDW 62.9 52.8 - 41.3 %   Platelets 676 (H) 150 - 400 K/uL  Basic metabolic panel     Status: Abnormal   Collection Time: 05/18/17  5:46 AM  Result Value Ref Range   Sodium 138 135 - 145 mmol/L   Potassium 3.8 3.5 - 5.1 mmol/L   Chloride 108 101 - 111 mmol/L   CO2 23 22 - 32 mmol/L   Glucose, Bld 154 (H) 65 - 99 mg/dL   BUN 31 (H) 6 - 20 mg/dL   Creatinine, Ser 2.44 0.61 - 1.24 mg/dL   Calcium 8.4 (L) 8.9 - 10.3 mg/dL   GFR calc non Af Amer >60 >60 mL/min   GFR calc Af Amer >60 >60 mL/min   Anion gap 7 5 - 15  Magnesium     Status: None   Collection Time: 05/18/17  5:46 AM  Result Value Ref Range   Magnesium 2.1 1.7 - 2.4 mg/dL  Phosphorus     Status: None   Collection Time: 05/18/17  5:46 AM  Result Value Ref Range   Phosphorus 3.9 2.5 - 4.6 mg/dL  Glucose, capillary      Status: Abnormal   Collection Time: 05/18/17  7:30 AM  Result Value Ref Range   Glucose-Capillary 154 (H) 65 - 99 mg/dL    Assessment & Plan: Present on Admission: . Depressed skull fracture (HCC)    LOS: 10 days   Additional comments:I reviewed the patient's new clinical lab test results. 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 Jenne Pane did bedside CR nasal FX 12/19, Dr. Dione Booze following R BB FA FX - S/P ORIF by Dr. Jena Gauss 12/11 Acute hypercarbic vent dependent resp failure - weaning again, hope to try to extubate next day or two PSA - positive for meth and THC, CSW eval once extubated ID - [facility] HCAP - ancef 4/7 R knee edema - further ortho eval once off vent R foot x-ray neg VTE - PAS, Lovenox FEN - TF Dispo - ICU. Critical Care Total Time*: 30 Minutes  Violeta Gelinas, MD, MPH, [facility] Trauma: [phone] General Surgery: [phone]  05/18/2017  *Care during the described time interval was provided by me. I have reviewed this patient's available data, including medical history, events of note, physical examination and test results as part of my evaluation.

## 2017-05-18 NOTE — Progress Notes (Signed)
NEUROSURGERY PROGRESS NOTE  Patient reintubated. FC appropriately, Perrla  Temp:  [97.5 F (36.4 C)-98.4 F (36.9 C)] 98.4 F (36.9 C) (12/21 0800) Pulse Rate:  [56-77] 77 (12/21 1000) Resp:  [14-29] 17 (12/21 1000) BP: (97-138)/(55-95) 138/76 (12/21 1000) SpO2:  [92 %-99 %] 96 % (12/21 1000) FiO2 (%):  [40 %] 40 % (12/21 1000) Weight:  [82.5 kg (181 lb 14.1 oz)] 82.5 kg (181 lb 14.1 oz) (12/21 0248)  Plan: No new nsgy recom. Following.   Sherryl MangesKimberly Hannah Nekayla Heider, NP 05/18/2017 10:58 AM

## 2017-05-19 LAB — GLUCOSE, CAPILLARY
GLUCOSE-CAPILLARY: 136 mg/dL — AB (ref 65–99)
GLUCOSE-CAPILLARY: 114 mg/dL — AB (ref 65–99)
Glucose-Capillary: 132 mg/dL — ABNORMAL HIGH (ref 65–99)
Glucose-Capillary: 109 mg/dL — ABNORMAL HIGH (ref 65–99)
Glucose-Capillary: 129 mg/dL — ABNORMAL HIGH (ref 65–99)
Glucose-Capillary: 131 mg/dL — ABNORMAL HIGH (ref 65–99)

## 2017-05-19 LAB — CBC
HEMATOCRIT: 26.3 % — AB (ref 39.0–52.0)
HEMOGLOBIN: 8.6 g/dL — AB (ref 13.0–17.0)
MCH: 28.7 pg (ref 26.0–34.0)
MCHC: 32.7 g/dL (ref 30.0–36.0)
MCV: 87.7 fL (ref 78.0–100.0)
Platelets: 567 10*3/uL — ABNORMAL HIGH (ref 150–400)
RBC: 3 MIL/uL — AB (ref 4.22–5.81)
RDW: 14 % (ref 11.5–15.5)
WBC: 11.5 10*3/uL — AB (ref 4.0–10.5)

## 2017-05-19 LAB — BASIC METABOLIC PANEL
ANION GAP: 5 (ref 5–15)
BUN: 25 mg/dL — ABNORMAL HIGH (ref 6–20)
CHLORIDE: 111 mmol/L (ref 101–111)
CO2: 22 mmol/L (ref 22–32)
Calcium: 8 mg/dL — ABNORMAL LOW (ref 8.9–10.3)
Creatinine, Ser: 0.54 mg/dL — ABNORMAL LOW (ref 0.61–1.24)
GFR calc non Af Amer: >60 mL/min (ref >60)
Glucose, Bld: 128 mg/dL — ABNORMAL HIGH (ref 65–99)
POTASSIUM: 3.7 mmol/L (ref 3.5–5.1)
SODIUM: 138 mmol/L (ref 135–145)

## 2017-05-19 NOTE — Progress Notes (Signed)
No major changes in status.  Currently sedated on ventilator.  No new recommendations.  Continue supportive care.

## 2017-05-19 NOTE — Progress Notes (Signed)
3 Days Post-Op   Subjective/Chief Complaint: NAE   Objective: Vital signs in last 24 hours: Temp:  [97.2 F (36.2 C)-98.2 F (36.8 C)] 98.2 F (36.8 C) (12/22 0400) Pulse Rate:  [55-78] 55 (12/22 0301) Resp:  [12-26] 12 (12/22 0700) BP: (97-141)/(58-92) 129/70 (12/22 0700) SpO2:  [95 %-100 %] 98 % (12/22 0700) FiO2 (%):  [40 %] 40 % (12/22 0301) Weight:  [81.8 kg (180 lb 5.4 oz)] 81.8 kg (180 lb 5.4 oz) (12/22 0400) Last BM Date: 05/11/17  Intake/Output from previous day: 12/21 0701 - 12/22 0700 In: 2726.6 [I.V.:1646.6; NG/GT:1080] Out: 2700 [Urine:2700] Intake/Output this shift: No intake/output data recorded.  Constitutional: No acute distress, intubated, appears states age. Eyes: Anicteric sclerae, moist conjunctiva, no lid lag Lungs: Clear to auscultation bilaterally, normal respiratory effort CV: regular rate and rhythm, no murmurs, no peripheral edema, pedal pulses 2+ GI: Soft, no masses or hepatosplenomegaly, non-tender to palpation Skin: No rashes, palpation reveals normal turgor Psychiatric: sedated and intubated   Lab Results:  Recent Labs    05/18/17 0546 05/19/17 0402  WBC 13.1* 11.5*  HGB 9.5* 8.6*  HCT 29.6* 26.3*  PLT 676* 567*   BMET Recent Labs    05/18/17 0546 05/19/17 0402  NA 138 138  K 3.8 3.7  CL 108 111  CO2 23 22  GLUCOSE 154* 128*  BUN 31* 25*  CREATININE 0.66 0.54*  CALCIUM 8.4* 8.0*   PT/INR No results for input(s): LABPROT, INR in the last 72 hours. ABG No results for input(s): PHART, HCO3 in the last 72 hours.  Invalid input(s): PCO2, PO2  Studies/Results: Dg Chest Port 1 View  Result Date: 05/17/2017 CLINICAL DATA:  Respiratory failure. EXAM: PORTABLE CHEST 1 VIEW COMPARISON:  Radiograph of May 16, 2017. FINDINGS: Stable cardiomediastinal silhouette. Endotracheal and nasogastric tubes are unchanged in position. Left subclavian catheter is unchanged with distal tip in expected position of SVC. No pneumothorax is  noted. Stable right infrahilar opacity is noted concerning for atelectasis or possibly infiltrate. No significant pleural effusion is noted. Bony thorax is unremarkable. Increased left infrahilar opacity is noted concerning for infiltrate or atelectasis. IMPRESSION: Stable support apparatus. Stable right infrahilar opacity is noted concerning for atelectasis or possibly infiltrate. Increased left infrahilar opacity is noted concerning for developing infiltrate or atelectasis. Electronically Signed   By: Lupita RaiderJames  Green Jr, M.D.   On: 05/17/2017 08:41    Anti-infectives: Anti-infectives (From admission, onward)   Start     Dose/Rate Route Frequency Ordered Stop   05/15/17 0900  ceFAZolin (ANCEF) IVPB 1 g/50 mL premix     1 g 100 mL/hr over 30 Minutes Intravenous Every 8 hours 05/15/17 0822     05/13/17 0800  vancomycin (VANCOCIN) 1,500 mg in sodium chloride 0.9 % 500 mL IVPB  Status:  Discontinued     1,500 mg 250 mL/hr over 120 Minutes Intravenous Every 8 hours 05/13/17 0439 05/15/17 0828   05/11/17 1130  piperacillin-tazobactam (ZOSYN) IVPB 3.375 g  Status:  Discontinued     3.375 g 12.5 mL/hr over 240 Minutes Intravenous Every 8 hours 05/11/17 1101 05/15/17 0828   05/11/17 1130  vancomycin (VANCOCIN) IVPB 1000 mg/200 mL premix  Status:  Discontinued     1,000 mg 200 mL/hr over 60 Minutes Intravenous Every 8 hours 05/11/17 1101 05/13/17 0439   05/08/17 2300  ceFAZolin (ANCEF) IVPB 1 g/50 mL premix     1 g 100 mL/hr over 30 Minutes Intravenous Every 8 hours 05/08/17 1548 05/09/17 1517  05/08/17 1403  vancomycin (VANCOCIN) powder  Status:  Discontinued       As needed 05/08/17 1403 05/08/17 1521      Assessment/Plan: s/p Procedure(s): CLOSED REDUCTION NASAL FRACTURE (N/A) PHBC TBI/B frontal ICC/depressed frontal bone FX - F/C, TBI team B orbit/ethmoid/crib plate/nasal FXs - Dr. Jenne PaneBates did bedside CR nasal FX 12/19, Dr. [name] following R BB FA FX - S/P ORIF by Dr. [name] 12/11 Acute  hypercarbic vent dependent resp failure - cont to wean, hope to try to extubate next day or two PSA - positive for meth and THC, CSW eval once extubated ID - [facility] HCAP - ancef 4/7 R knee edema - further ortho eval once off vent R foot x-ray neg VTE - PAS, Lovenox FEN - TF Dispo - ICU.  CC time:2030min   LOS: 11 days    Marigene EhlersRamirez Jr., Jed Limerickrmando 05/19/2017

## 2017-05-19 NOTE — Progress Notes (Signed)
RT Note: Patients RN stated that the patient's feeding tube came out. RT is unsure if the patient pulled it out or not. But she stated that tube feeds were suctioned from his subglottic tube. When RT arrived the patient was very agitated. He was suctioned and only white/yellow colored secretions were suctioned from his lungs. No tube feed was suctioned from his airway. He was placed back on his rest mode due to increased work of breathing and tachypnea. Rt will continue to monitor and assist as needed.

## 2017-05-19 NOTE — Plan of Care (Signed)
Patient is tolerating tube feeding at goal rate. Patiens is voiding consistently via external catheter.

## 2017-05-20 LAB — GLUCOSE, CAPILLARY
GLUCOSE-CAPILLARY: 120 mg/dL — AB (ref 65–99)
Glucose-Capillary: 117 mg/dL — ABNORMAL HIGH (ref 65–99)
Glucose-Capillary: 130 mg/dL — ABNORMAL HIGH (ref 65–99)
Glucose-Capillary: 130 mg/dL — ABNORMAL HIGH (ref 65–99)
Glucose-Capillary: 143 mg/dL — ABNORMAL HIGH (ref 65–99)
Glucose-Capillary: 155 mg/dL — ABNORMAL HIGH (ref 65–99)

## 2017-05-20 LAB — TRIGLYCERIDES: Triglycerides: 52 mg/dL (ref <150)

## 2017-05-20 MED ORDER — QUETIAPINE FUMARATE 25 MG PO TABS
50.0000 mg | ORAL_TABLET | Freq: Every day | ORAL | Status: DC
  Administered 2017-05-20: 50 mg
  Filled 2017-05-20: qty 2

## 2017-05-20 MED ORDER — CLONAZEPAM 0.125 MG PO TBDP
0.5000 mg | ORAL_TABLET | Freq: Two times a day (BID) | ORAL | Status: DC
  Administered 2017-05-20 (×2): 0.5 mg
  Filled 2017-05-20 (×2): qty 4

## 2017-05-20 NOTE — Progress Notes (Signed)
Subjective: Patient continues on ventilator, with sedation via fentanyl and propofol drips. Also started on Klonopin today.  Objective: Vital signs in last 24 hours: Vitals:   05/20/17 0800 05/20/17 0845 05/20/17 0900 05/20/17 1000  BP: 114/71 114/71 103/67 112/73  Pulse: 60 66    Resp: (!) 26 (!) 26 (!) 26 (!) 26  Temp: 97.6 F (36.4 C)     TempSrc: Axillary     SpO2: 100% 100% 96% 95%  Weight:      Height:        Intake/Output from previous day: 12/22 0701 - 12/23 0700 In: 2556.3 [I.V.:1536.3; NG/GT:920; IV Piggyback:100] Out: 3500 [Urine:3500] Intake/Output this shift: Total I/O In: 220.7 [I.V.:140.7; NG/GT:80] Out: 650 [Urine:650]  Physical Exam:  Moves all 4 extremities purposefully, but not following commands.  CBC Recent Labs    05/18/17 0546 05/19/17 0402  WBC 13.1* 11.5*  HGB 9.5* 8.6*  HCT 29.6* 26.3*  PLT 676* 567*   BMET Recent Labs    05/18/17 0546 05/19/17 0402  NA 138 138  K 3.8 3.7  CL 108 111  CO2 23 22  GLUCOSE 154* 128*  BUN 31* 25*  CREATININE 0.66 0.54*  CALCIUM 8.4* 8.0*    Assessment/Plan: Continue supportive care.   Hewitt ShortsNUDELMAN,ROBERT W, MD 05/20/2017, 10:15 AM

## 2017-05-20 NOTE — Progress Notes (Signed)
Follow up - Trauma Critical Care  Patient Details:    Lashaun Krapf is an 51 y.o. male.  Lines/tubes : Airway 7.5 mm (Active)  Secured at (cm) 25 cm 05/20/2017  3:59 AM  Measured From Lips 05/20/2017  3:59 AM  Secured Location Right 05/20/2017  3:59 AM  Secured By [employer] 05/20/2017  3:59 AM  Tube Holder Repositioned Yes 05/20/2017  3:59 AM  Cuff Pressure (cm H2O) 24 cm H2O 05/19/2017  8:00 PM  Site Condition Dry 05/20/2017  3:59 AM     PICC Triple Lumen 05/08/17 PICC Left Brachial 46 cm 2 cm (Active)  Indication for Insertion or Continuance of Line Vasoactive infusions 05/20/2017  8:00 AM  Exposed Catheter (cm) 2 cm 05/17/2017 10:18 AM  Site Assessment Intact;Dry;Clean 05/20/2017  8:00 AM  Lumen #1 Status Infusing 05/20/2017  8:00 AM  Lumen #2 Status Infusing 05/20/2017  8:00 AM  Lumen #3 Status In-line blood sampling system in place;Blood return noted 05/20/2017  8:00 AM  Dressing Type Transparent;Occlusive 05/20/2017  8:00 AM  Dressing Status Clean;Dry;Antimicrobial disc in place;Intact 05/20/2017  8:00 AM  Line Care Connections checked and tightened 05/20/2017  8:00 AM  Dressing Intervention Dressing changed 05/17/2017  8:00 AM  Dressing Change Due 05/22/17 05/20/2017  8:00 AM     NG/OG Tube Orogastric Center mouth Xray (Active)  Site Assessment Clean;Dry;Intact 05/20/2017  8:00 AM  Ongoing Placement Verification No change in respiratory status;No acute changes, not attributed to clinical condition;Xray 05/20/2017  8:00 AM  Status Infusing tube feed 05/20/2017  8:00 AM  Drainage Appearance Bile 05/17/2017  8:00 AM  Output (mL) 550 mL 05/17/2017  7:00 AM     External Urinary Catheter (Active)  Collection Container Standard drainage bag 05/20/2017  8:00 AM  Securement Method Leg strap 05/20/2017  8:00 AM  Output (mL) 300 mL 05/20/2017  8:00 AM    Microbiology/Sepsis markers: Results for orders placed or performed during the hospital encounter of  05/08/17  Culture, respiratory (NON-Expectorated)     Status: None   Collection Time: 05/11/17 10:40 AM  Result Value Ref Range Status   Specimen Description TRACHEAL ASPIRATE  Final   Special Requests Normal  Final   Gram Stain   Final    ABUNDANT WBC PRESENT, PREDOMINANTLY PMN RARE SQUAMOUS EPITHELIAL CELLS PRESENT ABUNDANT GRAM NEGATIVE COCCOBACILLI FEW GRAM POSITIVE COCCI IN PAIRS IN CLUSTERS FEW GRAM POSITIVE RODS RARE GRAM NEGATIVE COCCI IN PAIRS    Culture   Final    MODERATE STAPHYLOCOCCUS AUREUS ABUNDANT HAEMOPHILUS INFLUENZAE BETA LACTAMASE NEGATIVE    Report Status 05/14/2017 FINAL  Final   Organism ID, Bacteria STAPHYLOCOCCUS AUREUS  Final      Susceptibility   Staphylococcus aureus - MIC*    CIPROFLOXACIN <=0.5 SENSITIVE Sensitive     ERYTHROMYCIN RESISTANT Resistant     GENTAMICIN <=0.5 SENSITIVE Sensitive     OXACILLIN <=0.25 SENSITIVE Sensitive     TETRACYCLINE <=1 SENSITIVE Sensitive     VANCOMYCIN <=0.5 SENSITIVE Sensitive     TRIMETH/SULFA <=10 SENSITIVE Sensitive     CLINDAMYCIN RESISTANT Resistant     RIFAMPIN <=0.5 SENSITIVE Sensitive     Inducible Clindamycin POSITIVE Resistant     * MODERATE STAPHYLOCOCCUS AUREUS    Anti-infectives:  Anti-infectives (From admission, onward)   Start     Dose/Rate Route Frequency Ordered Stop   05/15/17 0900  ceFAZolin (ANCEF) IVPB 1 g/50 mL premix     1 g 100 mL/hr over 30 Minutes Intravenous Every  8 hours 05/15/17 0822     05/13/17 0800  vancomycin (VANCOCIN) 1,500 mg in sodium chloride 0.9 % 500 mL IVPB  Status:  Discontinued     1,500 mg 250 mL/hr over 120 Minutes Intravenous Every 8 hours 05/13/17 0439 05/15/17 0828   05/11/17 1130  piperacillin-tazobactam (ZOSYN) IVPB 3.375 g  Status:  Discontinued     3.375 g 12.5 mL/hr over 240 Minutes Intravenous Every 8 hours 05/11/17 1101 05/15/17 0828   05/11/17 1130  vancomycin (VANCOCIN) IVPB 1000 mg/200 mL premix  Status:  Discontinued     1,000 mg 200 mL/hr over  60 Minutes Intravenous Every 8 hours 05/11/17 1101 05/13/17 0439   05/08/17 2300  ceFAZolin (ANCEF) IVPB 1 g/50 mL premix     1 g 100 mL/hr over 30 Minutes Intravenous Every 8 hours 05/08/17 1548 05/09/17 1517   05/08/17 1403  vancomycin (VANCOCIN) powder  Status:  Discontinued       As needed 05/08/17 1403 05/08/17 1521      Best Practice/Protocols:  VTE Prophylaxis: Lovenox (prophylaxtic dose) Continous Sedation  Consults: Treatment Team:  Tia AlertJones, David S, MD Roby LoftsHaddix, Kevin P, MD    Studies:    Events:  Subjective:    Overnight Issues:   Objective:  Vital signs for last 24 hours: Temp:  [97.1 F (36.2 C)-98.2 F (36.8 C)] 97.6 F (36.4 C) (12/23 0800) Pulse Rate:  [55-68] 60 (12/23 0800) Resp:  [11-34] 26 (12/23 0800) BP: (95-157)/(59-85) 114/71 (12/23 0800) SpO2:  [93 %-100 %] 100 % (12/23 0800) FiO2 (%):  [40 %] 40 % (12/23 0800) Weight:  [82.8 kg (182 lb 8.7 oz)] 82.8 kg (182 lb 8.7 oz) (12/23 0500)  Hemodynamic parameters for last 24 hours:    Intake/Output from previous day: 12/22 0701 - 12/23 0700 In: 2556.3 [I.V.:1536.3; NG/GT:920; IV Piggyback:100] Out: 3500 [Urine:3500]  Intake/Output this shift: Total I/O In: 108 [I.V.:68; NG/GT:40] Out: 300 [Urine:300]  Vent settings for last 24 hours: Vent Mode: PRVC FiO2 (%):  [40 %] 40 % Set Rate:  [26 bmp] 26 bmp Vt Set:  [580 mL] 580 mL PEEP:  [5 cmH20] 5 cmH20 Pressure Support:  [5 cmH20-8 cmH20] 8 cmH20 Plateau Pressure:  [19 cmH20-22 cmH20] 22 cmH20  Physical Exam:  On vent Sedated Lungs CTA CV RRR Abd soft, NT, +BS  Results for orders placed or performed during the hospital encounter of 05/08/17 (from the past 24 hour(s))  Glucose, capillary     Status: Abnormal   Collection Time: 05/19/17 12:12 PM  Result Value Ref Range   Glucose-Capillary 109 (H) 65 - 99 mg/dL  Glucose, capillary     Status: Abnormal   Collection Time: 05/19/17  4:09 PM  Result Value Ref Range   Glucose-Capillary  129 (H) 65 - 99 mg/dL  Glucose, capillary     Status: Abnormal   Collection Time: 05/19/17  9:17 PM  Result Value Ref Range   Glucose-Capillary 131 (H) 65 - 99 mg/dL  Glucose, capillary     Status: Abnormal   Collection Time: 05/19/17 11:26 PM  Result Value Ref Range   Glucose-Capillary 114 (H) 65 - 99 mg/dL  Glucose, capillary     Status: Abnormal   Collection Time: 05/20/17  3:56 AM  Result Value Ref Range   Glucose-Capillary 143 (H) 65 - 99 mg/dL  Triglycerides     Status: None   Collection Time: 05/20/17  5:54 AM  Result Value Ref Range   Triglycerides 52 <150 mg/dL  Glucose,  capillary     Status: Abnormal   Collection Time: 05/20/17  7:58 AM  Result Value Ref Range   Glucose-Capillary 130 (H) 65 - 99 mg/dL    Assessment & Plan: Present on Admission: . Depressed skull fracture (HCC)    LOS: 12 days   Additional comments:I reviewed the patient's new clinical lab test results. Marland Kitchen. 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 Jenne PaneBates did bedside CR nasal FX 12/19, Dr. [name] following R BB FA FX - S/P ORIF by Dr. [name] 12/11 Acute hypercarbic vent dependent resp failure - cont to wean, hope to try to extubate next day or two PSA - positive for meth and THC, CSW eval once extubated ID - [facility] HCAP - ancef 6/7 R knee edema - further ortho eval once off vent R foot x-ray neg VTE - PAS, Lovenox FEN - TF, Add Klonopin and Seroquel to assist weaning Dispo - ICU. Critical Care Total Time*: 30 Minutes  Violeta GelinasBurke Roddie Riegler, MD, MPH, [facility] Trauma: [phone] General Surgery: [phone]  05/20/2017  *Care during the described time interval was provided by me. I have reviewed this patient's available data, including medical history, events of note, physical examination and test results as part of my evaluation.  Patient ID: [place] MutaDoyle Dean Gerken, male   DOB: 1966-02-09, 51 y.o.   MRN: [id number]

## 2017-05-20 NOTE — Plan of Care (Signed)
Pt tolerating tubefeeding at current and goal rate. Pt remains in sinus rhythm. Pt voiding freely using external catheter. Pt tolerating bedrest with frequent turns.

## 2017-05-21 ENCOUNTER — Inpatient Hospital Stay (HOSPITAL_COMMUNITY): Payer: No Typology Code available for payment source

## 2017-05-21 LAB — GLUCOSE, CAPILLARY
GLUCOSE-CAPILLARY: 130 mg/dL — AB (ref 65–99)
Glucose-Capillary: 100 mg/dL — ABNORMAL HIGH (ref 65–99)
Glucose-Capillary: 105 mg/dL — ABNORMAL HIGH (ref 65–99)
Glucose-Capillary: 124 mg/dL — ABNORMAL HIGH (ref 65–99)
Glucose-Capillary: 125 mg/dL — ABNORMAL HIGH (ref 65–99)
Glucose-Capillary: 150 mg/dL — ABNORMAL HIGH (ref 65–99)

## 2017-05-21 LAB — CBC
HEMATOCRIT: 27.1 % — AB (ref 39.0–52.0)
Hemoglobin: 8.9 g/dL — ABNORMAL LOW (ref 13.0–17.0)
MCH: 29 pg (ref 26.0–34.0)
MCHC: 32.8 g/dL (ref 30.0–36.0)
MCV: 88.3 fL (ref 78.0–100.0)
Platelets: 593 10*3/uL — ABNORMAL HIGH (ref 150–400)
RBC: 3.07 MIL/uL — AB (ref 4.22–5.81)
RDW: 14.4 % (ref 11.5–15.5)
WBC: 15.7 10*3/uL — AB (ref 4.0–10.5)

## 2017-05-21 MED ORDER — QUETIAPINE FUMARATE 25 MG PO TABS
50.0000 mg | ORAL_TABLET | Freq: Three times a day (TID) | ORAL | Status: DC
  Administered 2017-05-21 – 2017-05-25 (×12): 50 mg
  Filled 2017-05-21 (×12): qty 2

## 2017-05-21 MED ORDER — CLONAZEPAM 0.5 MG PO TBDP
1.0000 mg | ORAL_TABLET | Freq: Two times a day (BID) | ORAL | Status: DC
  Administered 2017-05-21 – 2017-05-24 (×8): 1 mg
  Filled 2017-05-21 (×8): qty 2

## 2017-05-21 MED ORDER — POLYETHYLENE GLYCOL 3350 17 G PO PACK
17.0000 g | PACK | Freq: Two times a day (BID) | ORAL | Status: DC
  Administered 2017-05-21 – 2017-05-23 (×6): 17 g via ORAL
  Filled 2017-05-21 (×6): qty 1

## 2017-05-21 NOTE — Progress Notes (Signed)
No issues overnight.   EXAM:  BP 97/64   Pulse 80   Temp 97.7 F (36.5 C) (Axillary)   Resp (!) 29   Ht 5\' 10"  (1.778 m)   Wt 79.4 kg (175 lb 0.7 oz)   SpO2 100%   BMI 25.12 kg/m   Sedated on fentanyl/propofol Grimaces to pain Moves all extremities purposefully, not following commands  IMPRESSION:  51 y.o. male with severe TBI  PLAN: - Cont supportive care

## 2017-05-21 NOTE — Progress Notes (Signed)
Trauma Critical Care Assessment and Plan:    Present on Admission: . Depressed skull fracture (HCC)    LOS: 13 days   Neuro: TBI/B frontal ICC/depressed frontal bone FX- F/C, TBI team B orbit/ethmoid/crib plate/nasal FXs- Dr. Jenne PaneBates did bedside CR nasal FX 12/19, Dr. [name] following Increase Klonopin and Seroquel to assist weaning - unable to tolerate breathing trial this am  Cardiac: No issues  Resp: Acute hypercarbic vent dependent resp failure- cont to wean, hope to try to extubate next day or two  GI: TF via OG  ID: [facility] - ancef 7/7  Renal/FEN: PSA- positive for meth and THC, CSW eval once extubated  Other Injuries: R BB FA FX- S/P ORIF by Dr. [name] 12/11 R knee edema- further ortho eval once off vent  Hematology: VTE- PAS, Lovenox  Endocrine: No issues  Global Issues: dispo - ICU  Critical Care Total Time*: 15 Minutes   Patient Details:    Patrick [name] is an 51 y.o. male.  Lines/tubes : Airway 7.5 mm (Active)  Secured at (cm) 25 cm 05/21/2017  7:32 AM  Measured From Lips 05/21/2017  7:32 AM  Secured Location Left 05/21/2017  7:32 AM  Secured By [employer] Tube Holder 05/21/2017  7:32 AM  Tube Holder Repositioned Yes 05/21/2017  7:32 AM  Cuff Pressure (cm H2O) 28 cm H2O 05/20/2017  8:00 PM  Site Condition Dry 05/21/2017  7:32 AM     PICC Triple Lumen 05/08/17 PICC Left Brachial 46 cm 2 cm (Active)  Indication for Insertion or Continuance of Line Vasoactive infusions 05/21/2017  8:00 AM  Exposed Catheter (cm) 2 cm 05/17/2017 10:18 AM  Site Assessment Intact;Dry;Clean 05/20/2017  8:00 PM  Lumen #1 Status Infusing 05/20/2017  8:00 PM  Lumen #2 Status Infusing 05/20/2017  8:00 PM  Lumen #3 Status In-line blood sampling system in place;Blood return noted 05/20/2017  8:00 PM  Dressing Type Transparent;Occlusive 05/20/2017  8:00 PM  Dressing Status Clean;Dry;Antimicrobial disc in place;Intact 05/20/2017  8:00 PM  Line Care  Connections checked and tightened 05/20/2017  8:00 PM  Dressing Intervention Dressing changed 05/17/2017  8:00 AM  Dressing Change Due 05/22/17 05/20/2017  8:00 PM     NG/OG Tube Orogastric Center mouth Xray (Active)  External Length of Tube (cm) - (if applicable) 24 cm 05/21/2017  4:00 AM  Site Assessment Clean;Dry;Intact 05/21/2017  4:00 AM  Ongoing Placement Verification No change in respiratory status;No acute changes, not attributed to clinical condition;Xray 05/21/2017  4:00 AM  Status Infusing tube feed 05/21/2017  4:00 AM  Drainage Appearance Bile 05/17/2017  8:00 AM  Output (mL) 550 mL 05/17/2017  7:00 AM     External Urinary Catheter (Active)  Collection Container Standard drainage bag 05/21/2017  4:00 AM  Securement Method Leg strap 05/20/2017  8:00 AM  Output (mL) 350 mL 05/21/2017  5:00 AM    Microbiology/Sepsis markers: Results for orders placed or performed during the hospital encounter of 05/08/17  Culture, respiratory (NON-Expectorated)     Status: None   Collection Time: 05/11/17 10:40 AM  Result Value Ref Range Status   Specimen Description TRACHEAL ASPIRATE  Final   Special Requests Normal  Final   Gram Stain   Final    ABUNDANT WBC PRESENT, PREDOMINANTLY PMN RARE SQUAMOUS EPITHELIAL CELLS PRESENT ABUNDANT GRAM NEGATIVE COCCOBACILLI FEW GRAM POSITIVE COCCI IN PAIRS IN CLUSTERS FEW GRAM POSITIVE RODS RARE GRAM NEGATIVE COCCI IN PAIRS    Culture   Final    MODERATE STAPHYLOCOCCUS  AUREUS ABUNDANT HAEMOPHILUS INFLUENZAE BETA LACTAMASE NEGATIVE    Report Status 05/14/2017 FINAL  Final   Organism ID, Bacteria STAPHYLOCOCCUS AUREUS  Final      Susceptibility   Staphylococcus aureus - MIC*    CIPROFLOXACIN <=0.5 SENSITIVE Sensitive     ERYTHROMYCIN RESISTANT Resistant     GENTAMICIN <=0.5 SENSITIVE Sensitive     OXACILLIN <=0.25 SENSITIVE Sensitive     TETRACYCLINE <=1 SENSITIVE Sensitive     VANCOMYCIN <=0.5 SENSITIVE Sensitive     TRIMETH/SULFA <=10  SENSITIVE Sensitive     CLINDAMYCIN RESISTANT Resistant     RIFAMPIN <=0.5 SENSITIVE Sensitive     Inducible Clindamycin POSITIVE Resistant     * MODERATE STAPHYLOCOCCUS AUREUS    Anti-infectives:  Anti-infectives (From admission, onward)   Start     Dose/Rate Route Frequency Ordered Stop   05/15/17 0900  ceFAZolin (ANCEF) IVPB 1 g/50 mL premix     1 g 100 mL/hr over 30 Minutes Intravenous Every 8 hours 05/15/17 0822 05/21/17 2359   05/13/17 0800  vancomycin (VANCOCIN) 1,500 mg in sodium chloride 0.9 % 500 mL IVPB  Status:  Discontinued     1,500 mg 250 mL/hr over 120 Minutes Intravenous Every 8 hours 05/13/17 0439 05/15/17 0828   05/11/17 1130  piperacillin-tazobactam (ZOSYN) IVPB 3.375 g  Status:  Discontinued     3.375 g 12.5 mL/hr over 240 Minutes Intravenous Every 8 hours 05/11/17 1101 05/15/17 0828   05/11/17 1130  vancomycin (VANCOCIN) IVPB 1000 mg/200 mL premix  Status:  Discontinued     1,000 mg 200 mL/hr over 60 Minutes Intravenous Every 8 hours 05/11/17 1101 05/13/17 0439   05/08/17 2300  ceFAZolin (ANCEF) IVPB 1 g/50 mL premix     1 g 100 mL/hr over 30 Minutes Intravenous Every 8 hours 05/08/17 1548 05/09/17 1517   05/08/17 1403  vancomycin (VANCOCIN) powder  Status:  Discontinued       As needed 05/08/17 1403 05/08/17 1521      Consults: Treatment Team:  Tia Alert, MD Haddix, Gillie Manners, MD    Studies:    Events: No events  Subjective:    Overnight Issues:  Unable to tolerate breathing trial this am, sedation restarted  Objective:  Vital signs for last 24 hours: Temp:  [97.6 F (36.4 C)-98 F (36.7 C)] 97.7 F (36.5 C) (12/24 0800) Pulse Rate:  [63-80] 80 (12/24 0732) Resp:  [11-31] 29 (12/24 0732) BP: (97-135)/(64-113) 97/64 (12/24 0732) SpO2:  [91 %-100 %] 100 % (12/24 0732) FiO2 (%):  [40 %] 40 % (12/24 0732) Weight:  [79.4 kg (175 lb 0.7 oz)] 79.4 kg (175 lb 0.7 oz) (12/24 0500)  Hemodynamic parameters for last 24 hours:     Intake/Output from previous day: 12/23 0701 - 12/24 0700 In: 2559.3 [I.V.:1599.3; NG/GT:960] Out: 2675 [Urine:2675]  Intake/Output this shift: No intake/output data recorded.  Vent settings for last 24 hours: Vent Mode: PRVC FiO2 (%):  [40 %] 40 % Set Rate:  [26 bmp] 26 bmp Vt Set:  [580 mL] 580 mL PEEP:  [5 cmH20] 5 cmH20 Pressure Support:  [5 cmH20] 5 cmH20 Plateau Pressure:  [22 cmH20-28 cmH20] 28 cmH20  Physical Exam:  Gen: intubated sedated Neuro: GCS 3t HEENT: tube in place Resp: CTAB Card: RRR Abd: soft, NT, ND Ext: no edema  Results for orders placed or performed during the hospital encounter of 05/08/17 (from the past 24 hour(s))  Glucose, capillary     Status: Abnormal   Collection  Time: 05/20/17  1:10 PM  Result Value Ref Range   Glucose-Capillary 117 (H) 65 - 99 mg/dL  Glucose, capillary     Status: Abnormal   Collection Time: 05/20/17  4:15 PM  Result Value Ref Range   Glucose-Capillary 155 (H) 65 - 99 mg/dL  Glucose, capillary     Status: Abnormal   Collection Time: 05/20/17  7:46 PM  Result Value Ref Range   Glucose-Capillary 120 (H) 65 - 99 mg/dL  Glucose, capillary     Status: Abnormal   Collection Time: 05/20/17 11:43 PM  Result Value Ref Range   Glucose-Capillary 130 (H) 65 - 99 mg/dL  Glucose, capillary     Status: Abnormal   Collection Time: 05/21/17  3:44 AM  Result Value Ref Range   Glucose-Capillary 124 (H) 65 - 99 mg/dL  CBC     Status: Abnormal   Collection Time: 05/21/17  6:00 AM  Result Value Ref Range   WBC 15.7 (H) 4.0 - 10.5 K/uL   RBC 3.07 (L) 4.22 - 5.81 MIL/uL   Hemoglobin 8.9 (L) 13.0 - 17.0 g/dL   HCT 40.927.1 (L) 81.139.0 - 91.452.0 %   MCV 88.3 78.0 - 100.0 fL   MCH 29.0 26.0 - 34.0 pg   MCHC 32.8 30.0 - 36.0 g/dL   RDW 78.214.4 95.611.5 - 21.315.5 %   Platelets 593 (H) 150 - 400 K/uL  Glucose, capillary     Status: Abnormal   Collection Time: 05/21/17  8:06 AM  Result Value Ref Range   Glucose-Capillary 105 (H) 65 - 99 mg/dL   Comment 1  Notify RN    Comment 2 Document in Chart      Feliciana RossettiLuke , M.D. [facility] Pg: [phone]  05/21/2017  *Care during the described time interval was provided by me. I have reviewed this patient's available data, including medical history, events of note, physical examination and test results as part of my evaluation.

## 2017-05-22 LAB — GLUCOSE, CAPILLARY
GLUCOSE-CAPILLARY: 117 mg/dL — AB (ref 65–99)
GLUCOSE-CAPILLARY: 129 mg/dL — AB (ref 65–99)
GLUCOSE-CAPILLARY: 147 mg/dL — AB (ref 65–99)
Glucose-Capillary: 144 mg/dL — ABNORMAL HIGH (ref 65–99)
Glucose-Capillary: 125 mg/dL — ABNORMAL HIGH (ref 65–99)
Glucose-Capillary: 100 mg/dL — ABNORMAL HIGH (ref 65–99)

## 2017-05-22 MED ORDER — PROPOFOL 1000 MG/100ML IV EMUL
0.0000 ug/kg/min | INTRAVENOUS | Status: DC
  Administered 2017-05-23: 12.5 ug/kg/min via INTRAVENOUS
  Administered 2017-05-23: 30 ug/kg/min via INTRAVENOUS
  Filled 2017-05-22 (×5): qty 100

## 2017-05-22 NOTE — Plan of Care (Signed)
Pt tolerating ventilator support on minimal sedation levels.  TF infusing at goal.  U/O adequate via external catheter.  VSS.  Ventilator weening unsuccessful today.  Skin intact w/o evidence of pressure injury.

## 2017-05-22 NOTE — Progress Notes (Signed)
At the beginning of the shift pt frequently alarming on the vent, intermittently desynchronous. RT was notified, suctioned pt with much secretion and since then has tolerated the vent much better with much less alarm being set off. During the midnight restraint assessment,  pt was monitored to see if less restrictive measures could be used. Pt has since that time been free of actions of pulling at the lines and tubing. Bilat safety mitts remain on and continued monitoring of pt via physical in room hourly contact and continued in room camera. Around 0200, about an hr following bathing the pt, his blood pressure began to drop and remain slightly low with MAP remaining 65 or above, as noted in vitals tab. Continued to monitor pt and reduce continuous sedation and pain medication rates as noted in MAR. Pt RASS remained -2 throughout this shift thus far. Will continue to monitor and assess pt.

## 2017-05-22 NOTE — Plan of Care (Signed)
Pt remaining to tolerate TF at goal rate. Pt remains in sinus rhythm. Pt skin remains free from any breakdown.

## 2017-05-22 NOTE — Progress Notes (Signed)
Patient ID: [name], male   DOB: 04-24-1966, 51 y.o.   MRN: [id number] Remains sedated and intubated but purposeful, following

## 2017-05-22 NOTE — Progress Notes (Signed)
Follow up - Trauma Critical Care  Patient Details:    Patrick [name] is an 51 y.o. male.  Lines/tubes : Airway 7.5 mm (Active)  Secured at (cm) 25 cm 05/22/2017  3:31 AM  Measured From Lips 05/22/2017  3:31 AM  Secured Location Left 05/22/2017  3:31 AM  Secured By [employer] Tube Holder 05/22/2017  3:31 AM  Tube Holder Repositioned Yes 05/22/2017  3:31 AM  Cuff Pressure (cm H2O) 28 cm H2O 05/21/2017  7:40 PM  Site Condition Dry 05/22/2017  3:31 AM     PICC Triple Lumen 05/08/17 PICC Left Brachial 46 cm 2 cm (Active)  Indication for Insertion or Continuance of Line Prolonged intravenous therapies;Vasoactive infusions 05/21/2017  8:00 PM  Exposed Catheter (cm) 2 cm 05/17/2017 10:18 AM  Site Assessment Intact;Dry;Clean 05/21/2017  8:00 PM  Lumen #1 Status Infusing 05/21/2017  8:00 PM  Lumen #2 Status Infusing 05/21/2017  8:00 PM  Lumen #3 Status In-line blood sampling system in place;Blood return noted 05/21/2017  8:00 PM  Dressing Type Transparent;Occlusive 05/21/2017  8:00 PM  Dressing Status Clean;Dry;Antimicrobial disc in place;Intact 05/21/2017  8:00 PM  Line Care Connections checked and tightened 05/21/2017  8:00 PM  Dressing Intervention New dressing 05/22/2017  4:00 AM  Dressing Change Due 05/29/17 05/22/2017  4:00 AM     NG/OG Tube Orogastric Center mouth Xray (Active)  External Length of Tube (cm) - (if applicable) 24 cm 05/21/2017  8:00 PM  Site Assessment Clean;Dry;Intact 05/22/2017  4:00 AM  Ongoing Placement Verification No change in respiratory status;No acute changes, not attributed to clinical condition;Xray 05/22/2017  4:00 AM  Status Infusing tube feed 05/22/2017  4:00 AM  Drainage Appearance Bile 05/17/2017  8:00 AM  Output (mL) 550 mL 05/17/2017  7:00 AM     External Urinary Catheter (Active)  Collection Container Standard drainage bag 05/22/2017  4:00 AM  Securement Method Leg strap 05/22/2017  4:00 AM  Output (mL) 250 mL 05/22/2017  8:00 AM     Microbiology/Sepsis markers: Results for orders placed or performed during the hospital encounter of 05/08/17  Culture, respiratory (NON-Expectorated)     Status: None   Collection Time: 05/11/17 10:40 AM  Result Value Ref Range Status   Specimen Description TRACHEAL ASPIRATE  Final   Special Requests Normal  Final   Gram Stain   Final    ABUNDANT WBC PRESENT, PREDOMINANTLY PMN RARE SQUAMOUS EPITHELIAL CELLS PRESENT ABUNDANT GRAM NEGATIVE COCCOBACILLI FEW GRAM POSITIVE COCCI IN PAIRS IN CLUSTERS FEW GRAM POSITIVE RODS RARE GRAM NEGATIVE COCCI IN PAIRS    Culture   Final    MODERATE STAPHYLOCOCCUS AUREUS ABUNDANT HAEMOPHILUS INFLUENZAE BETA LACTAMASE NEGATIVE    Report Status 05/14/2017 FINAL  Final   Organism ID, Bacteria STAPHYLOCOCCUS AUREUS  Final      Susceptibility   Staphylococcus aureus - MIC*    CIPROFLOXACIN <=0.5 SENSITIVE Sensitive     ERYTHROMYCIN RESISTANT Resistant     GENTAMICIN <=0.5 SENSITIVE Sensitive     OXACILLIN <=0.25 SENSITIVE Sensitive     TETRACYCLINE <=1 SENSITIVE Sensitive     VANCOMYCIN <=0.5 SENSITIVE Sensitive     TRIMETH/SULFA <=10 SENSITIVE Sensitive     CLINDAMYCIN RESISTANT Resistant     RIFAMPIN <=0.5 SENSITIVE Sensitive     Inducible Clindamycin POSITIVE Resistant     * MODERATE STAPHYLOCOCCUS AUREUS    Anti-infectives:  Anti-infectives (From admission, onward)   Start     Dose/Rate Route Frequency Ordered Stop   05/15/17 0900  ceFAZolin (ANCEF) IVPB  1 g/50 mL premix     1 g 100 mL/hr over 30 Minutes Intravenous Every 8 hours 05/15/17 0822 05/21/17 2300   05/13/17 0800  vancomycin (VANCOCIN) 1,500 mg in sodium chloride 0.9 % 500 mL IVPB  Status:  Discontinued     1,500 mg 250 mL/hr over 120 Minutes Intravenous Every 8 hours 05/13/17 0439 05/15/17 0828   05/11/17 1130  piperacillin-tazobactam (ZOSYN) IVPB 3.375 g  Status:  Discontinued     3.375 g 12.5 mL/hr over 240 Minutes Intravenous Every 8 hours 05/11/17 1101 05/15/17 0828    05/11/17 1130  vancomycin (VANCOCIN) IVPB 1000 mg/200 mL premix  Status:  Discontinued     1,000 mg 200 mL/hr over 60 Minutes Intravenous Every 8 hours 05/11/17 1101 05/13/17 0439   05/08/17 2300  ceFAZolin (ANCEF) IVPB 1 g/50 mL premix     1 g 100 mL/hr over 30 Minutes Intravenous Every 8 hours 05/08/17 1548 05/09/17 1517   05/08/17 1403  vancomycin (VANCOCIN) powder  Status:  Discontinued       As needed 05/08/17 1403 05/08/17 1521       Consults: Treatment Team:  Tia Alert, MD Haddix, Gillie Manners, MD    Studies:    Events:  Subjective:    Overnight Issues:  No changes overnight No progress with vent ween Objective:  Vital signs for last 24 hours: Temp:  [97.6 F (36.4 C)-98.2 F (36.8 C)] 98.1 F (36.7 C) (12/25 0355) Pulse Rate:  [52-75] 56 (12/25 0331) Resp:  [22-30] 22 (12/25 0700) BP: (79-121)/(47-78) 121/67 (12/25 0700) SpO2:  [90 %-100 %] 99 % (12/25 0700) FiO2 (%):  [40 %] 40 % (12/25 0331) Weight:  [79.9 kg (176 lb 2.4 oz)] 79.9 kg (176 lb 2.4 oz) (12/25 0600)  Hemodynamic parameters for last 24 hours:    Intake/Output from previous day: 12/24 0701 - 12/25 0700 In: 2570.6 [I.V.:1610.6; NG/GT:960] Out: 2525 [Urine:2525]  Intake/Output this shift: Total I/O In: -  Out: 250 [Urine:250]  Vent settings for last 24 hours: Vent Mode: PRVC FiO2 (%):  [40 %] 40 % Set Rate:  [26 bmp] 26 bmp Vt Set:  [580 mL] 580 mL PEEP:  [5 cmH20] 5 cmH20 Plateau Pressure:  [20 cmH20-28 cmH20] 20 cmH20  Physical Exam:  On vent, sedated Lungs clear CV tachy Abdomen soft, NT  Results for orders placed or performed during the hospital encounter of 05/08/17 (from the past 24 hour(s))  Glucose, capillary     Status: Abnormal   Collection Time: 05/21/17 11:57 AM  Result Value Ref Range   Glucose-Capillary 150 (H) 65 - 99 mg/dL  Glucose, capillary     Status: Abnormal   Collection Time: 05/21/17  3:30 PM  Result Value Ref Range   Glucose-Capillary 130 (H) 65  - 99 mg/dL   Comment 1 Notify RN    Comment 2 Document in Chart   Glucose, capillary     Status: Abnormal   Collection Time: 05/21/17  7:45 PM  Result Value Ref Range   Glucose-Capillary 125 (H) 65 - 99 mg/dL  Glucose, capillary     Status: Abnormal   Collection Time: 05/21/17 11:38 PM  Result Value Ref Range   Glucose-Capillary 100 (H) 65 - 99 mg/dL  Glucose, capillary     Status: Abnormal   Collection Time: 05/22/17  3:58 AM  Result Value Ref Range   Glucose-Capillary 144 (H) 65 - 99 mg/dL    Assessment & Plan: Present on Admission: . Depressed skull  fracture (HCC)  TBI/B frontal ICC/depressed frontal bone FX- F/C, TBI team B orbit/ethmoid/crib plate/nasal FXs  Resp:  cotinue vent for resp failure.  Ween when possible Repeat labs in the morning Continue tube feeds  05/22/2017    Patient ID: Patrick Mutaoyle Dean Suleiman, male   DOB: 05-07-1966, 51 y.o.   MRN: [id number]

## 2017-05-23 LAB — GLUCOSE, CAPILLARY
GLUCOSE-CAPILLARY: 116 mg/dL — AB (ref 65–99)
GLUCOSE-CAPILLARY: 86 mg/dL (ref 65–99)
Glucose-Capillary: 161 mg/dL — ABNORMAL HIGH (ref 65–99)
Glucose-Capillary: 121 mg/dL — ABNORMAL HIGH (ref 65–99)
Glucose-Capillary: 130 mg/dL — ABNORMAL HIGH (ref 65–99)
Glucose-Capillary: 125 mg/dL — ABNORMAL HIGH (ref 65–99)

## 2017-05-23 LAB — POCT I-STAT 3, ART BLOOD GAS (G3+)
Acid-base deficit: 1 mmol/L (ref 0.0–2.0)
BICARBONATE: 22.2 mmol/L (ref 20.0–28.0)
O2 Saturation: 97 %
PCO2 ART: 31.3 mmHg — AB (ref 32.0–48.0)
PO2 ART: 80 mmHg — AB (ref 83.0–108.0)
TCO2: 23 mmol/L (ref 22–32)
pH, Arterial: 7.457 — ABNORMAL HIGH (ref 7.350–7.450)

## 2017-05-23 LAB — CBC
HEMATOCRIT: 29.1 % — AB (ref 39.0–52.0)
Hemoglobin: 9.7 g/dL — ABNORMAL LOW (ref 13.0–17.0)
MCH: 29.1 pg (ref 26.0–34.0)
MCHC: 33.3 g/dL (ref 30.0–36.0)
MCV: 87.4 fL (ref 78.0–100.0)
Platelets: 608 10*3/uL — ABNORMAL HIGH (ref 150–400)
RBC: 3.33 MIL/uL — ABNORMAL LOW (ref 4.22–5.81)
RDW: 14.3 % (ref 11.5–15.5)
WBC: 12.5 10*3/uL — ABNORMAL HIGH (ref 4.0–10.5)

## 2017-05-23 LAB — TRIGLYCERIDES: Triglycerides: 353 mg/dL — ABNORMAL HIGH (ref <150)

## 2017-05-23 NOTE — Progress Notes (Signed)
Follow up - Trauma and Critical Care  Patient Details:    Patrick [name] is an 51 y.o. male.  Lines/tubes : Airway 7.5 mm (Active)  Secured at (cm) 25 cm 05/23/2017 11:33 AM  Measured From Lips 05/23/2017 11:33 AM  Secured Location Right 05/23/2017 11:33 AM  Secured By [employer] Tube Holder 05/23/2017 11:33 AM  Tube Holder Repositioned Yes 05/23/2017 11:33 AM  Cuff Pressure (cm H2O) 28 cm H2O 05/23/2017  7:00 AM  Site Condition Dry 05/23/2017 11:33 AM     PICC Triple Lumen 05/08/17 PICC Left Brachial 46 cm 2 cm (Active)  Indication for Insertion or Continuance of Line Prolonged intravenous therapies;Vasoactive infusions 05/23/2017  8:00 AM  Exposed Catheter (cm) 2 cm 05/17/2017 10:18 AM  Site Assessment Clean;Dry;Intact 05/23/2017  8:00 AM  Lumen #1 Status Infusing 05/23/2017  8:00 AM  Lumen #2 Status Infusing 05/23/2017  8:00 AM  Lumen #3 Status In-line blood sampling system in place 05/23/2017  8:00 AM  Dressing Type Transparent;Occlusive 05/23/2017  8:00 AM  Dressing Status Dry;Clean;Intact;Antimicrobial disc in place 05/23/2017  8:00 AM  Line Care Connections checked and tightened 05/23/2017  8:00 AM  Dressing Intervention Other (Comment) 05/22/2017  8:00 PM  Dressing Change Due 05/29/17 05/23/2017  8:00 AM     NG/OG Tube Orogastric Center mouth Xray (Active)  External Length of Tube (cm) - (if applicable) 24 cm 05/22/2017  8:00 PM  Site Assessment Clean;Dry;Intact 05/23/2017  8:00 AM  Ongoing Placement Verification No change in cm markings or external length of tube from initial placement;No change in respiratory status;No acute changes, not attributed to clinical condition 05/23/2017  8:00 AM  Status Infusing tube feed 05/23/2017  8:00 AM  Drainage Appearance Bile 05/17/2017  8:00 AM  Intake (mL) 200 mL 05/23/2017 12:00 AM  Output (mL) 550 mL 05/17/2017  7:00 AM     External Urinary Catheter (Active)  Collection Container Standard drainage bag 05/23/2017  8:00 AM   Securement Method Leg strap 05/23/2017  8:00 AM  Intervention Equipment Changed 05/22/2017  5:00 PM  Output (mL) 240 mL 05/23/2017 12:00 PM    Microbiology/Sepsis markers: Results for orders placed or performed during the hospital encounter of 05/08/17  Culture, respiratory (NON-Expectorated)     Status: None   Collection Time: 05/11/17 10:40 AM  Result Value Ref Range Status   Specimen Description TRACHEAL ASPIRATE  Final   Special Requests Normal  Final   Gram Stain   Final    ABUNDANT WBC PRESENT, PREDOMINANTLY PMN RARE SQUAMOUS EPITHELIAL CELLS PRESENT ABUNDANT GRAM NEGATIVE COCCOBACILLI FEW GRAM POSITIVE COCCI IN PAIRS IN CLUSTERS FEW GRAM POSITIVE RODS RARE GRAM NEGATIVE COCCI IN PAIRS    Culture   Final    MODERATE STAPHYLOCOCCUS AUREUS ABUNDANT HAEMOPHILUS INFLUENZAE BETA LACTAMASE NEGATIVE    Report Status 05/14/2017 FINAL  Final   Organism ID, Bacteria STAPHYLOCOCCUS AUREUS  Final      Susceptibility   Staphylococcus aureus - MIC*    CIPROFLOXACIN <=0.5 SENSITIVE Sensitive     ERYTHROMYCIN RESISTANT Resistant     GENTAMICIN <=0.5 SENSITIVE Sensitive     OXACILLIN <=0.25 SENSITIVE Sensitive     TETRACYCLINE <=1 SENSITIVE Sensitive     VANCOMYCIN <=0.5 SENSITIVE Sensitive     TRIMETH/SULFA <=10 SENSITIVE Sensitive     CLINDAMYCIN RESISTANT Resistant     RIFAMPIN <=0.5 SENSITIVE Sensitive     Inducible Clindamycin POSITIVE Resistant     * MODERATE STAPHYLOCOCCUS AUREUS    Anti-infectives:  Anti-infectives (From admission, onward)  Start     Dose/Rate Route Frequency Ordered Stop   05/15/17 0900  ceFAZolin (ANCEF) IVPB 1 g/50 mL premix     1 g 100 mL/hr over 30 Minutes Intravenous Every 8 hours 05/15/17 0822 05/21/17 2300   05/13/17 0800  vancomycin (VANCOCIN) 1,500 mg in sodium chloride 0.9 % 500 mL IVPB  Status:  Discontinued     1,500 mg 250 mL/hr over 120 Minutes Intravenous Every 8 hours 05/13/17 0439 05/15/17 0828   05/11/17 1130   piperacillin-tazobactam (ZOSYN) IVPB 3.375 g  Status:  Discontinued     3.375 g 12.5 mL/hr over 240 Minutes Intravenous Every 8 hours 05/11/17 1101 05/15/17 0828   05/11/17 1130  vancomycin (VANCOCIN) IVPB 1000 mg/200 mL premix  Status:  Discontinued     1,000 mg 200 mL/hr over 60 Minutes Intravenous Every 8 hours 05/11/17 1101 05/13/17 0439   05/08/17 2300  ceFAZolin (ANCEF) IVPB 1 g/50 mL premix     1 g 100 mL/hr over 30 Minutes Intravenous Every 8 hours 05/08/17 1548 05/09/17 1517   05/08/17 1403  vancomycin (VANCOCIN) powder  Status:  Discontinued       As needed 05/08/17 1403 05/08/17 1521      Best Practice/Protocols:  VTE Prophylaxis: Lovenox (prophylaxtic dose) and Mechanical GI Prophylaxis: Proton Pump Inhibitor Continous Sedation  Consults: Treatment Team:  Tia Alert, MD Haddix, Gillie Manners, MD    Events:  Subjective:    Overnight Issues: No known issues overnight.  Weaning to try to extubated today.  Objective:  Vital signs for last 24 hours: Temp:  [98.1 F (36.7 C)-98.7 F (37.1 C)] 98.7 F (37.1 C) (12/26 0800) Pulse Rate:  [54-91] 54 (12/26 1200) Resp:  [13-26] 26 (12/26 1200) BP: (91-152)/(52-99) 92/61 (12/26 1200) SpO2:  [91 %-100 %] 100 % (12/26 1200) FiO2 (%):  [40 %] 40 % (12/26 1200) Weight:  [77.2 kg (170 lb 3.1 oz)] 77.2 kg (170 lb 3.1 oz) (12/26 0500)  Hemodynamic parameters for last 24 hours:    Intake/Output from previous day: 12/25 0701 - 12/26 0700 In: 2633.6 [I.V.:1338.6; NG/GT:1295] Out: 3610 [Urine:3610]  Intake/Output this shift: Total I/O In: 474.8 [I.V.:274.8; NG/GT:200] Out: 300 [Urine:300]  Vent settings for last 24 hours: Vent Mode: PRVC FiO2 (%):  [40 %] 40 % Set Rate:  [26 bmp] 26 bmp Vt Set:  [580 mL] 580 mL PEEP:  [5 cmH20] 5 cmH20 Pressure Support:  [10 cmH20] 10 cmH20 Plateau Pressure:  [19 cmH20-21 cmH20] 21 cmH20  Physical Exam:  General: no respiratory distress and will awaken with sedation turned  down. Neuro: nonfocal exam, RASS -1 and well sedated currently. HEENT/Neck: ETT WNL  Resp: clear to auscultation bilaterally CVS: regular rate and rhythm, S1, S2 normal, no murmur, click, rub or gallop GI: Good bowel sounds.  Not tender. Extremities: no edema, no erythema, pulses WNL  Results for orders placed or performed during the hospital encounter of 05/08/17 (from the past 24 hour(s))  Glucose, capillary     Status: Abnormal   Collection Time: 05/22/17  3:48 PM  Result Value Ref Range   Glucose-Capillary 147 (H) 65 - 99 mg/dL   Comment 1 Notify RN    Comment 2 Document in Chart   Glucose, capillary     Status: Abnormal   Collection Time: 05/22/17  8:11 PM  Result Value Ref Range   Glucose-Capillary 100 (H) 65 - 99 mg/dL  Glucose, capillary     Status: Abnormal   Collection Time: 05/22/17  11:51 PM  Result Value Ref Range   Glucose-Capillary 129 (H) 65 - 99 mg/dL  Glucose, capillary     Status: Abnormal   Collection Time: 05/23/17  3:39 AM  Result Value Ref Range   Glucose-Capillary 161 (H) 65 - 99 mg/dL  Triglycerides     Status: Abnormal   Collection Time: 05/23/17  5:21 AM  Result Value Ref Range   Triglycerides 353 (H) <150 mg/dL  CBC     Status: Abnormal   Collection Time: 05/23/17  5:21 AM  Result Value Ref Range   WBC 12.5 (H) 4.0 - 10.5 K/uL   RBC 3.33 (L) 4.22 - 5.81 MIL/uL   Hemoglobin 9.7 (L) 13.0 - 17.0 g/dL   HCT 16.129.1 (L) 09.639.0 - 04.552.0 %   MCV 87.4 78.0 - 100.0 fL   MCH 29.1 26.0 - 34.0 pg   MCHC 33.3 30.0 - 36.0 g/dL   RDW 40.914.3 81.111.5 - 91.415.5 %   Platelets 608 (H) 150 - 400 K/uL  Glucose, capillary     Status: Abnormal   Collection Time: 05/23/17  8:10 AM  Result Value Ref Range   Glucose-Capillary 121 (H) 65 - 99 mg/dL   Comment 1 Notify RN    Comment 2 Document in Chart   Glucose, capillary     Status: Abnormal   Collection Time: 05/23/17 12:16 PM  Result Value Ref Range   Glucose-Capillary 130 (H) 65 - 99 mg/dL     Assessment/Plan:   NEURO   Altered Mental Status:  sedation   Plan: Need to wean sedation for possible extubations soon.  PULM  Atelectasis/collapse (focal and RLL from two days ago.) ABG is pending.   Plan: ABG, possible extubation.  Has been on steroids  CARDIO  No issues   Plan: CPM  RENAL  Urine output is good.   Plan: No changes  GI  No issues   Plan: tolerating tube feedings. well  ID  No known current infectious sources   Plan: CPM  HEME  Stable anemia   Plan: CPM  ENDO No known issues   Plan: CPM  Global Issues  Attempt to extubate after ABG is drawn.  Will wean and hold tube feedings.    LOS: 15 days   Additional comments:I reviewed the patient's new clinical lab test results. [email or web address]  Critical Care Total Time*: 30 Minutes  Patrick [name] 05/23/2017  *Care during the described time interval was provided by me and/or other providers on the critical care team.  I have reviewed this patient's available data, including medical history, events of note, physical examination and test results as part of my evaluation.

## 2017-05-24 ENCOUNTER — Inpatient Hospital Stay (HOSPITAL_COMMUNITY): Payer: No Typology Code available for payment source

## 2017-05-24 LAB — POCT I-STAT 3, ART BLOOD GAS (G3+)
ACID-BASE DEFICIT: 3 mmol/L — AB (ref 0.0–2.0)
ACID-BASE EXCESS: 1 mmol/L (ref 0.0–2.0)
Acid-base deficit: 3 mmol/L — ABNORMAL HIGH (ref 0.0–2.0)
BICARBONATE: 20.3 mmol/L (ref 20.0–28.0)
Bicarbonate: 22 mmol/L (ref 20.0–28.0)
Bicarbonate: 21.3 mmol/L (ref 20.0–28.0)
O2 SAT: 96 %
O2 Saturation: 96 %
O2 Saturation: 97 %
PCO2 ART: 33.5 mmHg (ref 32.0–48.0)
PH ART: 7.558 — AB (ref 7.350–7.450)
PH ART: 7.43 (ref 7.350–7.450)
Patient temperature: 98
TCO2: 23 mmol/L (ref 22–32)
TCO2: 21 mmol/L — ABNORMAL LOW (ref 22–32)
TCO2: 22 mmol/L (ref 22–32)
pCO2 arterial: 24.6 mmHg — ABNORMAL LOW (ref 32.0–48.0)
pCO2 arterial: 30.6 mmHg — ABNORMAL LOW (ref 32.0–48.0)
pH, Arterial: 7.41 (ref 7.350–7.450)
pO2, Arterial: 70 mmHg — ABNORMAL LOW (ref 83.0–108.0)
pO2, Arterial: 87 mmHg (ref 83.0–108.0)
pO2, Arterial: 80 mmHg — ABNORMAL LOW (ref 83.0–108.0)

## 2017-05-24 LAB — GLUCOSE, CAPILLARY
GLUCOSE-CAPILLARY: 105 mg/dL — AB (ref 65–99)
GLUCOSE-CAPILLARY: 116 mg/dL — AB (ref 65–99)
GLUCOSE-CAPILLARY: 122 mg/dL — AB (ref 65–99)
GLUCOSE-CAPILLARY: 158 mg/dL — AB (ref 65–99)
GLUCOSE-CAPILLARY: 79 mg/dL (ref 65–99)
Glucose-Capillary: 135 mg/dL — ABNORMAL HIGH (ref 65–99)

## 2017-05-24 LAB — BASIC METABOLIC PANEL
Anion gap: 7 (ref 5–15)
BUN: 27 mg/dL — ABNORMAL HIGH (ref 6–20)
CHLORIDE: 109 mmol/L (ref 101–111)
CO2: 21 mmol/L — ABNORMAL LOW (ref 22–32)
Calcium: 8.3 mg/dL — ABNORMAL LOW (ref 8.9–10.3)
Creatinine, Ser: 0.52 mg/dL — ABNORMAL LOW (ref 0.61–1.24)
GFR calc non Af Amer: >60 mL/min (ref >60)
Glucose, Bld: 140 mg/dL — ABNORMAL HIGH (ref 65–99)
POTASSIUM: 3.5 mmol/L (ref 3.5–5.1)
SODIUM: 137 mmol/L (ref 135–145)

## 2017-05-24 LAB — CBC WITH DIFFERENTIAL/PLATELET
Basophils Absolute: 0 10*3/uL (ref 0.0–0.1)
Basophils Relative: 0 %
EOS ABS: 0 10*3/uL (ref 0.0–0.7)
Eosinophils Relative: 0 %
HCT: 31.5 % — ABNORMAL LOW (ref 39.0–52.0)
HEMOGLOBIN: 10.4 g/dL — AB (ref 13.0–17.0)
LYMPHS ABS: 1.7 10*3/uL (ref 0.7–4.0)
Lymphocytes Relative: 14 %
MCH: 29 pg (ref 26.0–34.0)
MCHC: 33 g/dL (ref 30.0–36.0)
MCV: 87.7 fL (ref 78.0–100.0)
Monocytes Absolute: 0.7 10*3/uL (ref 0.1–1.0)
Monocytes Relative: 6 %
NEUTROS PCT: 80 %
Neutro Abs: 9.7 10*3/uL — ABNORMAL HIGH (ref 1.7–7.7)
Platelets: 626 10*3/uL — ABNORMAL HIGH (ref 150–400)
RBC: 3.59 MIL/uL — AB (ref 4.22–5.81)
RDW: 14.3 % (ref 11.5–15.5)
WBC: 12.1 10*3/uL — AB (ref 4.0–10.5)

## 2017-05-24 LAB — MRSA PCR SCREENING: MRSA BY PCR: NEGATIVE

## 2017-05-24 MED ORDER — DEXMEDETOMIDINE HCL IN NACL 200 MCG/50ML IV SOLN
0.0000 ug/kg/h | INTRAVENOUS | Status: AC
  Administered 2017-05-24: 0.1 ug/kg/h via INTRAVENOUS
  Administered 2017-05-24: 0.3 ug/kg/h via INTRAVENOUS
  Administered 2017-05-25: 1 ug/kg/h via INTRAVENOUS
  Administered 2017-05-25: 0.1 ug/kg/h via INTRAVENOUS
  Administered 2017-05-26: 0.3 ug/kg/h via INTRAVENOUS
  Administered 2017-05-26: 0.2 ug/kg/h via INTRAVENOUS
  Administered 2017-05-26: 0.4 ug/kg/h via INTRAVENOUS
  Filled 2017-05-24 (×8): qty 50

## 2017-05-24 MED ORDER — PIVOT 1.5 CAL PO LIQD: 1000.0000 mL | ORAL | Status: DC

## 2017-05-24 MED ORDER — PRO-STAT SUGAR FREE PO LIQD
30.0000 mL | Freq: Every day | ORAL | Status: DC
  Administered 2017-05-24: 30 mL via ORAL
  Filled 2017-05-24: qty 30
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
## 2017-05-25 DIAGNOSIS — S069X9D Unspecified intracranial injury with loss of consciousness of unspecified duration, subsequent encounter: Secondary | ICD-10-CM

## 2017-05-25 DIAGNOSIS — S0292XD Unspecified fracture of facial bones, subsequent encounter for fracture with routine healing: Secondary | ICD-10-CM

## 2017-05-25 DIAGNOSIS — Z9981 Dependence on supplemental oxygen: Secondary | ICD-10-CM

## 2017-05-25 DIAGNOSIS — I609 Nontraumatic subarachnoid hemorrhage, unspecified: Secondary | ICD-10-CM

## 2017-05-25 DIAGNOSIS — R739 Hyperglycemia, unspecified: Secondary | ICD-10-CM

## 2017-05-25 DIAGNOSIS — S0291XD Unspecified fracture of skull, subsequent encounter for fracture with routine healing: Secondary | ICD-10-CM

## 2017-05-25 DIAGNOSIS — T380X5A Adverse effect of glucocorticoids and synthetic analogues, initial encounter: Secondary | ICD-10-CM

## 2017-05-25 DIAGNOSIS — S83519D Sprain of anterior cruciate ligament of unspecified knee, subsequent encounter: Secondary | ICD-10-CM

## 2017-05-25 DIAGNOSIS — D72829 Elevated white blood cell count, unspecified: Secondary | ICD-10-CM

## 2017-05-25 LAB — GLUCOSE, CAPILLARY
GLUCOSE-CAPILLARY: 148 mg/dL — AB (ref 65–99)
GLUCOSE-CAPILLARY: 159 mg/dL — AB (ref 65–99)
Glucose-Capillary: 87 mg/dL (ref 65–99)
Glucose-Capillary: 88 mg/dL (ref 65–99)
Glucose-Capillary: 82 mg/dL (ref 65–99)

## 2017-05-25 MED ORDER — QUETIAPINE FUMARATE 50 MG PO TABS
50.0000 mg | ORAL_TABLET | Freq: Two times a day (BID) | ORAL | Status: DC
  Administered 2017-05-28 – 2017-06-03 (×14): 50 mg via ORAL
  Filled 2017-05-25: qty 1
  Filled 2017-05-25: qty 2
  Filled 2017-05-25 (×3): qty 1
  Filled 2017-05-25: qty 2
  Filled 2017-05-25: qty 1
  Filled 2017-05-25 (×2): qty 2
  Filled 2017-05-25 (×4): qty 1
  Filled 2017-05-25: qty 2

## 2017-05-25 MED ORDER — FENTANYL CITRATE (PF) 100 MCG/2ML IJ SOLN
50.0000 ug | INTRAMUSCULAR | Status: DC | PRN
  Administered 2017-05-25 – 2017-05-31 (×5): 100 ug via INTRAVENOUS
  Filled 2017-05-25 (×6): qty 2

## 2017-05-25 MED ORDER — CLONAZEPAM 0.5 MG PO TBDP
0.5000 mg | ORAL_TABLET | Freq: Two times a day (BID) | ORAL | Status: DC
Start: 2017-05-25 — End: 2017-06-04
  Administered 2017-05-28 – 2017-06-03 (×14): 0.5 mg via ORAL
  Filled 2017-05-25 (×15): qty 1
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
## 2017-05-25 NOTE — Progress Notes (Signed)
Rehab Admissions Coordinator Note:  Patient was screened by Trish MageLogue, Adaeze Better M for appropriateness for an Inpatient Acute Rehab Consult.  At this time, we are recommending Inpatient Rehab consult.  Trish MageLogue, Sharmain Lastra M 05/25/2017, 2:05 PM  I can be reached at [phone].
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
## 2017-05-25 NOTE — Progress Notes (Signed)
Trauma Service Note  Subjective: Patient is much better today.  Extubated this morning and is doing very well on nasal cannula oxygen supplementation.  A bit agitated, but will follow commands.  Objective: Vital signs in last 24 hours: Temp:  [96.7 F (35.9 C)-98.5 F (36.9 C)] 98.5 F (36.9 C) (12/27 2300) Pulse Rate:  [56-86] 80 (12/28 0800) Resp:  [14-26] 17 (12/28 0800) BP: (78-116)/(47-80) 116/68 (12/28 0800) SpO2:  [95 %-100 %] 98 % (12/28 0820) FiO2 (%):  [40 %] 40 % (12/28 0800) Weight:  [78.2 kg (172 lb 6.4 oz)] 78.2 kg (172 lb 6.4 oz) (12/28 0429) Last BM Date: 05/24/17  Intake/Output from previous day: 12/27 0701 - 12/28 0700 In: 2371.8 [I.V.:1388.5; NG/GT:983.3] Out: 1780 [Urine:1780] Intake/Output this shift: Total I/O In: 45.9 [I.V.:45.9] Out: 0   General: No acute distress.  Needs TBI team evaluation.  Lungs: Clear  Abd: Soft, bening, OGT out and not getting tube feedings. currently  Extremities: No clinical signs or symptoms of DVT  Neuro: GCS 10 or so.  Will follow commands.  Lab Results: CBC  Recent Labs    05/23/17 0521 05/24/17 0500  WBC 12.5* 12.1*  HGB 9.7* 10.4*  HCT 29.1* 31.5*  PLT 608* 626*   BMET Recent Labs    05/24/17 0500  NA 137  K 3.5  CL 109  CO2 21*  GLUCOSE 140*  BUN 27*  CREATININE 0.52*  CALCIUM 8.3*   PT/INR No results for input(s): LABPROT, INR in the last 72 hours. ABG Recent Labs    05/24/17 1330 05/24/17 1701  PHART 7.430 7.410  HCO3 20.3 21.3    Studies/Results: Dg Forearm Right  Result Date: 05/24/2017 CLINICAL DATA:  Status post ORIF of radial and ulnar fractures. EXAM: RIGHT FOREARM - 2 VIEW COMPARISON:  Intraoperative films from 05/08/2017 FINDINGS: Fixation side plates are again noted along the mid radius and ulna with near anatomic alignment of the fracture fragments. A minimally displaced fracture in the distal ulna is again seen and stable. No new focal abnormality is noted. IMPRESSION:  Changes of ORIF of midshaft radial and ulnar fractures. Stable mildly displaced distal ulnar fracture. Electronically Signed   By: Alcide CleverMark  Lukens M.D.   On: 05/24/2017 10:11   Dg Abd Portable 1v  Result Date: 05/24/2017 CLINICAL DATA:  Check nasogastric catheter placement EXAM: PORTABLE ABDOMEN - 1 VIEW COMPARISON:  None. FINDINGS: Scattered large and small bowel gas is noted. Gastric catheter is noted coiled within the midportion of the stomach. No other focal abnormality is noted. IMPRESSION: Catheter within the stomach as described. Electronically Signed   By: Alcide CleverMark  Lukens M.D.   On: 05/24/2017 10:11    Anti-infectives: Anti-infectives (From admission, onward)   Start     Dose/Rate Route Frequency Ordered Stop   05/15/17 0900  ceFAZolin (ANCEF) IVPB 1 g/50 mL premix     1 g 100 mL/hr over 30 Minutes Intravenous Every 8 hours 05/15/17 0822 05/21/17 2300   05/13/17 0800  vancomycin (VANCOCIN) 1,500 mg in sodium chloride 0.9 % 500 mL IVPB  Status:  Discontinued     1,500 mg 250 mL/hr over 120 Minutes Intravenous Every 8 hours 05/13/17 0439 05/15/17 0828   05/11/17 1130  piperacillin-tazobactam (ZOSYN) IVPB 3.375 g  Status:  Discontinued     3.375 g 12.5 mL/hr over 240 Minutes Intravenous Every 8 hours 05/11/17 1101 05/15/17 0828   05/11/17 1130  vancomycin (VANCOCIN) IVPB 1000 mg/200 mL premix  Status:  Discontinued  1,000 mg 200 mL/hr over 60 Minutes Intravenous Every 8 hours 05/11/17 1101 05/13/17 0439   05/08/17 2300  ceFAZolin (ANCEF) IVPB 1 g/50 mL premix     1 g 100 mL/hr over 30 Minutes Intravenous Every 8 hours 05/08/17 1548 05/09/17 1517   05/08/17 1403  vancomycin (VANCOCIN) powder  Status:  Discontinued       As needed 05/08/17 1403 05/08/17 1521      Assessment/Plan: s/p Procedure(s): CLOSED REDUCTION NASAL FRACTURE d/c foley TBI team evaluation.  LOS: 17 days   Marta LamasJames O. Gae BonWyatt, III, MD, FACS [phone] Trauma Surgeon 05/25/2017

## 2017-05-25 NOTE — Procedures (Signed)
Extubation Procedure Note  Patient Details:   Name: Patrick [name] DOB: [birth year] MRN: [id number]   Airway Documentation:  Airway 7.5 mm (Active)  Secured at (cm) 25 cm 05/25/2017  8:00 AM  Measured From Lips 05/25/2017  8:00 AM  Secured Location Right 05/25/2017  8:00 AM  Secured By [employer] Tube Holder 05/25/2017  8:00 AM  Tube Holder Repositioned Yes 05/25/2017  7:29 AM  Cuff Pressure (cm H2O) 28 cm H2O 05/25/2017  7:29 AM  Site Condition Dry 05/25/2017  8:00 AM   Patient extubated per Dr Lindie SpruceWyatt. Patient had positive cuff leak. Patient placed on 4lpm humidified oxygen. Will continue to monitor.  Evaluation  O2 sats: stable throughout Complications: No apparent complications Patient did tolerate procedure well. Bilateral Breath Sounds: Clear, Diminished   Yes  Patrick [name] 05/25/2017, 8:21 AM
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
## 2017-05-26 ENCOUNTER — Inpatient Hospital Stay (HOSPITAL_COMMUNITY): Payer: No Typology Code available for payment source

## 2017-05-26 LAB — GLUCOSE, CAPILLARY
GLUCOSE-CAPILLARY: 99 mg/dL (ref 65–99)
Glucose-Capillary: 101 mg/dL — ABNORMAL HIGH (ref 65–99)
Glucose-Capillary: 107 mg/dL — ABNORMAL HIGH (ref 65–99)
Glucose-Capillary: 94 mg/dL (ref 65–99)
Glucose-Capillary: 95 mg/dL (ref 65–99)
Glucose-Capillary: 99 mg/dL (ref 65–99)

## 2017-05-26 MED ORDER — ORAL CARE MOUTH RINSE
15.0000 mL | Freq: Two times a day (BID) | OROMUCOSAL | Status: DC
  Administered 2017-05-26 – 2017-05-29 (×7): 15 mL via OROMUCOSAL

## 2017-05-26 MED ORDER — CHLORHEXIDINE GLUCONATE 0.12 % MT SOLN
15.0000 mL | Freq: Two times a day (BID) | OROMUCOSAL | Status: DC
  Administered 2017-05-26 – 2017-05-30 (×8): 15 mL via OROMUCOSAL
  Filled 2017-05-26 (×6): qty 15

## 2017-05-26 NOTE — Progress Notes (Signed)
IV Team Note;   Pt has a TL PICC line in the Left upper arm, placed 05-08-17; there is an order to remove the picc;  Am unable to remove the picc line past the 11 cm mark; pt becomes very upset with gentle traction to the picc line in attempt to remove it;   Heat packs were applied to the upper arm to dilate veins, and arm repositioned;  Attempted again to gently remove the picc line without success;  RN has called and gotten a STAT CXR order;  Will follow up after CXR is done; may need to have PICC line removed in Radiology;    Barkley BrunsLisa Kasidee Voisin RN IV Team

## 2017-05-26 NOTE — Progress Notes (Signed)
IV Team Update;   Stat CXR done to confirm placement;  Second IV Team RN able to remove picc after turning pt's head to the left;  picc intact, measured 46 cms;  Gauze dressing intact; no bleeding; site slightly pink;   Barkley BrunsLisa Paylin Hailu RN IV Team

## 2017-05-26 NOTE — Progress Notes (Signed)
Trauma Service Note  Subjective: Patient cooperative and minimally verbal.  No distress.   Objective: Vital signs in last 24 hours: Temp:  [97.7 F (36.5 C)-98.6 F (37 C)] 97.7 F (36.5 C) (12/29 0400) Pulse Rate:  [63-76] 63 (12/28 1600) Resp:  [14-26] 22 (12/29 0630) BP: (85-134)/(58-101) 113/87 (12/29 0630) SpO2:  [90 %-100 %] 96 % (12/29 0630) Last BM Date: 05/25/17  Intake/Output from previous day: 12/28 0701 - 12/29 0700 In: 896.7 [I.V.:841.7; NG/GT:55] Out: 2600 [Urine:2600] Intake/Output this shift: No intake/output data recorded.  General: No distress.  Seems a bit antsy on Precedex.  Not taking anything orally.  Reluctant to pass Cortrak because of facial fractures  Lungs: Clear  Abd: Benign  Extremities: No clinical signs or symptoms of DVT  Neuro: Still impulsive.  No distress.  Okay to transfer to SDU once he is off Precedex drip.  Lab Results: CBC  Recent Labs    05/24/17 0500  WBC 12.1*  HGB 10.4*  HCT 31.5*  PLT 626*   BMET Recent Labs    05/24/17 0500  NA 137  K 3.5  CL 109  CO2 21*  GLUCOSE 140*  BUN 27*  CREATININE 0.52*  CALCIUM 8.3*   PT/INR No results for input(s): LABPROT, INR in the last 72 hours. ABG Recent Labs    05/24/17 1330 05/24/17 1701  PHART 7.430 7.410  HCO3 20.3 21.3    Studies/Results: Dg Forearm Right  Result Date: 05/24/2017 CLINICAL DATA:  Status post ORIF of radial and ulnar fractures. EXAM: RIGHT FOREARM - 2 VIEW COMPARISON:  Intraoperative films from 05/08/2017 FINDINGS: Fixation side plates are again noted along the mid radius and ulna with near anatomic alignment of the fracture fragments. A minimally displaced fracture in the distal ulna is again seen and stable. No new focal abnormality is noted. IMPRESSION: Changes of ORIF of midshaft radial and ulnar fractures. Stable mildly displaced distal ulnar fracture. Electronically Signed   By: Alcide CleverMark  Lukens M.D.   On: 05/24/2017 10:11   Dg Abd Portable  1v  Result Date: 05/24/2017 CLINICAL DATA:  Check nasogastric catheter placement EXAM: PORTABLE ABDOMEN - 1 VIEW COMPARISON:  None. FINDINGS: Scattered large and small bowel gas is noted. Gastric catheter is noted coiled within the midportion of the stomach. No other focal abnormality is noted. IMPRESSION: Catheter within the stomach as described. Electronically Signed   By: Alcide CleverMark  Lukens M.D.   On: 05/24/2017 10:11    Anti-infectives: Anti-infectives (From admission, onward)   Start     Dose/Rate Route Frequency Ordered Stop   05/15/17 0900  ceFAZolin (ANCEF) IVPB 1 g/50 mL premix     1 g 100 mL/hr over 30 Minutes Intravenous Every 8 hours 05/15/17 0822 05/21/17 2300   05/13/17 0800  vancomycin (VANCOCIN) 1,500 mg in sodium chloride 0.9 % 500 mL IVPB  Status:  Discontinued     1,500 mg 250 mL/hr over 120 Minutes Intravenous Every 8 hours 05/13/17 0439 05/15/17 0828   05/11/17 1130  piperacillin-tazobactam (ZOSYN) IVPB 3.375 g  Status:  Discontinued     3.375 g 12.5 mL/hr over 240 Minutes Intravenous Every 8 hours 05/11/17 1101 05/15/17 0828   05/11/17 1130  vancomycin (VANCOCIN) IVPB 1000 mg/200 mL premix  Status:  Discontinued     1,000 mg 200 mL/hr over 60 Minutes Intravenous Every 8 hours 05/11/17 1101 05/13/17 0439   05/08/17 2300  ceFAZolin (ANCEF) IVPB 1 g/50 mL premix     1 g 100 mL/hr over 30  Minutes Intravenous Every 8 hours 05/08/17 1548 05/09/17 1517   05/08/17 1403  vancomycin (VANCOCIN) powder  Status:  Discontinued       As needed 05/08/17 1403 05/08/17 1521      Assessment/Plan: s/p Procedure(s): CLOSED REDUCTION NASAL FRACTURE Would like to advance diet through ST.  Try to get the patient off Preecedex which will require using some oral sedative.  LOS: 18 days   Marta LamasJames O. Gae BonWyatt, III, MD, FACS [phone] Trauma Surgeon 05/26/2017
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
## 2017-05-26 NOTE — Progress Notes (Signed)
This RN notified by IV team RN that there was resistance when attempting to remove PICC. Paged Dr. Cliffton AstersWhite to make aware. Dr. Cliffton AstersWhite requested staff leave PICC in place. See associated orders for DG Chest/L Shoulder. Will call back after xrays result.

## 2017-05-27 LAB — CBC WITH DIFFERENTIAL/PLATELET
BASOS ABS: 0 10*3/uL (ref 0.0–0.1)
Basophils Relative: 0 %
EOS PCT: 1 %
Eosinophils Absolute: 0.1 10*3/uL (ref 0.0–0.7)
HCT: 40.8 % (ref 39.0–52.0)
Hemoglobin: 14.1 g/dL (ref 13.0–17.0)
LYMPHS PCT: 35 %
Lymphs Abs: 3.1 10*3/uL (ref 0.7–4.0)
MCH: 29.9 pg (ref 26.0–34.0)
MCHC: 34.6 g/dL (ref 30.0–36.0)
MCV: 86.4 fL (ref 78.0–100.0)
Monocytes Absolute: 0.4 10*3/uL (ref 0.1–1.0)
Monocytes Relative: 5 %
NEUTROS ABS: 5.3 10*3/uL (ref 1.7–7.7)
Neutrophils Relative %: 59 %
PLATELETS: 699 10*3/uL — AB (ref 150–400)
RBC: 4.72 MIL/uL (ref 4.22–5.81)
RDW: 13.9 % (ref 11.5–15.5)
WBC: 8.9 10*3/uL (ref 4.0–10.5)

## 2017-05-27 LAB — GLUCOSE, CAPILLARY
GLUCOSE-CAPILLARY: 90 mg/dL (ref 65–99)
GLUCOSE-CAPILLARY: 128 mg/dL — AB (ref 65–99)
Glucose-Capillary: 101 mg/dL — ABNORMAL HIGH (ref 65–99)
Glucose-Capillary: 88 mg/dL (ref 65–99)
Glucose-Capillary: 110 mg/dL — ABNORMAL HIGH (ref 65–99)

## 2017-05-27 LAB — BASIC METABOLIC PANEL
ANION GAP: 9 (ref 5–15)
BUN: 25 mg/dL — ABNORMAL HIGH (ref 6–20)
CO2: 22 mmol/L (ref 22–32)
Calcium: 9.2 mg/dL (ref 8.9–10.3)
Chloride: 106 mmol/L (ref 101–111)
Creatinine, Ser: 0.87 mg/dL (ref 0.61–1.24)
GFR calc Af Amer: >60 mL/min (ref >60)
GLUCOSE: 88 mg/dL (ref 65–99)
POTASSIUM: 3.6 mmol/L (ref 3.5–5.1)
Sodium: 137 mmol/L (ref 135–145)

## 2017-05-27 MED ORDER — SODIUM CHLORIDE 0.9% FLUSH: 10.0000 mL | INTRAVENOUS | Status: DC | PRN

## 2017-05-27 MED ORDER — CHLORHEXIDINE GLUCONATE CLOTH 2 % EX PADS
6.0000 | MEDICATED_PAD | Freq: Every day | CUTANEOUS | Status: DC
  Administered 2017-05-27 – 2017-06-06 (×11): 6 via TOPICAL

## 2017-05-27 MED ORDER — SODIUM CHLORIDE 0.9% FLUSH: 10.0000 mL | Freq: Two times a day (BID) | INTRAVENOUS | Status: DC

## 2017-05-27 MED ORDER — INSULIN ASPART 100 UNIT/ML ~~LOC~~ SOLN
0.0000 [IU] | Freq: Four times a day (QID) | SUBCUTANEOUS | Status: DC
  Administered 2017-05-28 – 2017-05-29 (×4): 1 [IU] via SUBCUTANEOUS
  Administered 2017-05-29 – 2017-05-31 (×3): 2 [IU] via SUBCUTANEOUS

## 2017-05-27 MED ORDER — SODIUM CHLORIDE 0.9 % IV SOLN
INTRAVENOUS | Status: DC
  Administered 2017-05-27 – 2017-06-02 (×3): via INTRAVENOUS

## 2017-05-27 MED ORDER — TRAVASOL 10 % IV SOLN
INTRAVENOUS | Status: AC
  Administered 2017-05-27: 18:00:00 via INTRAVENOUS
  Filled 2017-05-27: qty 566.4

## 2017-05-27 NOTE — Progress Notes (Signed)
Subjective: The patient is somnolent but arousable.  He is in no apparent distress.  Objective: Vital signs in last 24 hours: Temp:  [97.8 F (36.6 C)-98.4 F (36.9 C)] 97.8 F (36.6 C) (12/30 0800) Pulse Rate:  [65] 65 (12/30 0800) Resp:  [14-23] 15 (12/30 1000) BP: (101-135)/(70-95) 125/86 (12/30 1000) SpO2:  [94 %-100 %] 99 % (12/30 1000) Weight:  [71.2 kg (156 lb 15.5 oz)] 71.2 kg (156 lb 15.5 oz) (12/30 0400)  Intake/Output from previous day: 12/29 0701 - 12/30 0700 In: 1093.4 [I.V.:1093.4] Out: 1403 [Urine:1400; Stool:3] Intake/Output this shift: No intake/output data recorded.  Physical exam Glasgow Coma Scale 10 E3M5V2.  He localizes bilaterally.  His pupils are equal.  Lab Results: No results for input(s): WBC, HGB, HCT, PLT in the last 72 hours. BMET No results for input(s): NA, K, CL, CO2, GLUCOSE, BUN, CREATININE, CALCIUM in the last 72 hours.  Studies/Results: Dg Chest Portable 1 View  Result Date: 05/26/2017 CLINICAL DATA:  Resistant when IV team attempted to remove PICC line. EXAM: PORTABLE CHEST 1 VIEW COMPARISON:  May 21, 2017 FINDINGS: The heart size and mediastinal contours are stable. A left-sided PICC line is identified with distal tip in the proximal aspect of the superior vena cava. No focal infiltrate, pulmonary edema, or pleural effusion. The visualized skeletal structures are stable. IMPRESSION: Left-sided PICC line is identified with distal tip in the proximal aspect of superior vena cava. Electronically Signed   By: Sherian ReinWei-Chen  Lin M.D.   On: 05/26/2017 11:39   Dg Shoulder Left Portable  Result Date: 05/26/2017 CLINICAL DATA:  PICC line EXAM: LEFT SHOULDER - 1 VIEW COMPARISON:  None. FINDINGS: The left upper extremity PICC is in place. The tip traverses into the mediastinum. The mediastinum is partially imaged. The visualized left lung is clear. The right lung is not included. IMPRESSION: See above comments. Electronically Signed   By: Jolaine ClickArthur  Hoss  M.D.   On: 05/26/2017 11:41    Assessment/Plan: Traumatic brain injury: Continue supportive care.  It looks like he will need skilled nursing facility placement versus rehab.  LOS: 19 days     Cristi LoronJeffrey D Tamanika Heiney 05/27/2017, 10:31 AM     Patient ID: Patrick [name] Patrick [name], male   DOB: 1965-12-03, 51 y.o.   MRN: [id number]

## 2017-05-27 NOTE — Progress Notes (Signed)
Trauma Service Note  Subjective: Patient doing okay.  Somnolent for me currently.  Has not passed swallowing evaluation  Objective: Vital signs in last 24 hours: Temp:  [97.8 F (36.6 C)-98.4 F (36.9 C)] 97.8 F (36.6 C) (12/30 0800) Pulse Rate:  [65] 65 (12/30 0800) Resp:  [14-23] 17 (12/30 0700) BP: (101-135)/(70-95) 116/80 (12/30 0800) SpO2:  [94 %-100 %] 100 % (12/30 0800) Weight:  [71.2 kg (156 lb 15.5 oz)] 71.2 kg (156 lb 15.5 oz) (12/30 0400) Last BM Date: 05/26/17  Intake/Output from previous day: 12/29 0701 - 12/30 0700 In: 1093.4 [I.V.:1093.4] Out: 1403 [Urine:1400; Stool:3] Intake/Output this shift: No intake/output data recorded.  General: No distress.  Somnolent  Lungs: Clear  Abd: Soft., benign  Extremities: No changes  Neuro: Somnolent.  Will intermittently follow commands.  Lab Results: CBC  No results for input(s): WBC, HGB, HCT, PLT in the last 72 hours. BMET No results for input(s): NA, K, CL, CO2, GLUCOSE, BUN, CREATININE, CALCIUM in the last 72 hours. PT/INR No results for input(s): LABPROT, INR in the last 72 hours. ABG Recent Labs    05/24/17 1330 05/24/17 1701  PHART 7.430 7.410  HCO3 20.3 21.3    Studies/Results: Dg Chest Portable 1 View  Result Date: 05/26/2017 CLINICAL DATA:  Resistant when IV team attempted to remove PICC line. EXAM: PORTABLE CHEST 1 VIEW COMPARISON:  May 21, 2017 FINDINGS: The heart size and mediastinal contours are stable. A left-sided PICC line is identified with distal tip in the proximal aspect of the superior vena cava. No focal infiltrate, pulmonary edema, or pleural effusion. The visualized skeletal structures are stable. IMPRESSION: Left-sided PICC line is identified with distal tip in the proximal aspect of superior vena cava. Electronically Signed   By: Sherian ReinWei-Chen  Lin M.D.   On: 05/26/2017 11:39   Dg Shoulder Left Portable  Result Date: 05/26/2017 CLINICAL DATA:  PICC line EXAM: LEFT SHOULDER - 1  VIEW COMPARISON:  None. FINDINGS: The left upper extremity PICC is in place. The tip traverses into the mediastinum. The mediastinum is partially imaged. The visualized left lung is clear. The right lung is not included. IMPRESSION: See above comments. Electronically Signed   By: Jolaine ClickArthur  Hoss M.D.   On: 05/26/2017 11:41    Anti-infectives: Anti-infectives (From admission, onward)   Start     Dose/Rate Route Frequency Ordered Stop   05/15/17 0900  ceFAZolin (ANCEF) IVPB 1 g/50 mL premix     1 g 100 mL/hr over 30 Minutes Intravenous Every 8 hours 05/15/17 0822 05/21/17 2300   05/13/17 0800  vancomycin (VANCOCIN) 1,500 mg in sodium chloride 0.9 % 500 mL IVPB  Status:  Discontinued     1,500 mg 250 mL/hr over 120 Minutes Intravenous Every 8 hours 05/13/17 0439 05/15/17 0828   05/11/17 1130  piperacillin-tazobactam (ZOSYN) IVPB 3.375 g  Status:  Discontinued     3.375 g 12.5 mL/hr over 240 Minutes Intravenous Every 8 hours 05/11/17 1101 05/15/17 0828   05/11/17 1130  vancomycin (VANCOCIN) IVPB 1000 mg/200 mL premix  Status:  Discontinued     1,000 mg 200 mL/hr over 60 Minutes Intravenous Every 8 hours 05/11/17 1101 05/13/17 0439   05/08/17 2300  ceFAZolin (ANCEF) IVPB 1 g/50 mL premix     1 g 100 mL/hr over 30 Minutes Intravenous Every 8 hours 05/08/17 1548 05/09/17 1517   05/08/17 1403  vancomycin (VANCOCIN) powder  Status:  Discontinued       As needed 05/08/17 1403 05/08/17 1521  Assessment/Plan: s/p Procedure(s): CLOSED REDUCTION NASAL FRACTURE Cannot pass feeding tube because of major basilar skull fractures and ethmoidal fractures.  Start TPN. He is not getting any of his enteral sedatives because he is NPO and relying on Precedex alone.  Will try to stop Precedex and restrain as necessary.  LOS: 19 days   Marta LamasJames O. Gae BonWyatt, III, MD, FACS [phone] Trauma Surgeon 05/27/2017
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
## 2017-05-27 NOTE — Progress Notes (Signed)
SLP Cancellation Note  Patient Details Name: Patrick [name] Dean Mccolm MRN: [id number] DOB: August 11, 1965   Cancelled treatment:       Reason Eval/Treat Not Completed: Patient at procedure or test/unavailable. Will continue attempts.  [name], [place]MS, CCC-SLP Speech-Language Pathologist [phone]   [name] 05/27/2017, 2:28 PM

## 2017-05-27 NOTE — Progress Notes (Signed)
Peripherally Inserted Central Catheter/Midline Placement  The IV Nurse has discussed with the patient and/or persons authorized to consent for the patient, the purpose of this procedure and the potential benefits and risks involved with this procedure.  The benefits include less needle sticks, lab draws from the catheter, and the patient may be discharged home with the catheter. Risks include, but not limited to, infection, bleeding, blood clot (thrombus formation), and puncture of an artery; nerve damage and irregular heartbeat and possibility to perform a PICC exchange if needed/ordered by physician.  Alternatives to this procedure were also discussed.  Bard Power PICC patient education guide, fact sheet on infection prevention and patient information card has been provided to patient /or left at bedside.  Telephone consent obtained from family.  PICC/Midline Placement Documentation  PICC Double Lumen 05/27/17 PICC Left Brachial 43 cm 0 cm (Active)  Indication for Insertion or Continuance of Line Administration of hyperosmolar/irritating solutions (i.e. TPN, Vancomycin, etc.) 05/27/2017  2:47 PM  Exposed Catheter (cm) 0 cm 05/27/2017  2:47 PM  Site Assessment Clean;Dry;Intact 05/27/2017  2:47 PM  Lumen #1 Status Flushed;Saline locked;Blood return noted 05/27/2017  2:47 PM  Lumen #2 Status Flushed;Saline locked;Blood return noted 05/27/2017  2:47 PM  Dressing Type Transparent 05/27/2017  2:47 PM  Dressing Status Clean;Dry;Intact;Antimicrobial disc in place 05/27/2017  2:47 PM  Line Care Connections checked and tightened 05/27/2017  2:47 PM  Line Adjustment (NICU/IV Team Only) No 05/27/2017  2:47 PM  Dressing Intervention New dressing 05/27/2017  2:47 PM  Dressing Change Due 06/03/17 05/27/2017  2:47 PM       Elliot Dallyiggs, Lealand Elting Wright 05/27/2017, 2:48 PM

## 2017-05-28 ENCOUNTER — Encounter (HOSPITAL_COMMUNITY): Payer: Self-pay | Admitting: Physical Medicine and Rehabilitation

## 2017-05-28 ENCOUNTER — Inpatient Hospital Stay (HOSPITAL_COMMUNITY): Payer: No Typology Code available for payment source

## 2017-05-28 DIAGNOSIS — R739 Hyperglycemia, unspecified: Secondary | ICD-10-CM

## 2017-05-28 DIAGNOSIS — S069X9A Unspecified intracranial injury with loss of consciousness of unspecified duration, initial encounter: Secondary | ICD-10-CM

## 2017-05-28 DIAGNOSIS — D72829 Elevated white blood cell count, unspecified: Secondary | ICD-10-CM

## 2017-05-28 DIAGNOSIS — I609 Nontraumatic subarachnoid hemorrhage, unspecified: Secondary | ICD-10-CM

## 2017-05-28 DIAGNOSIS — Z9981 Dependence on supplemental oxygen: Secondary | ICD-10-CM

## 2017-05-28 DIAGNOSIS — T380X5A Adverse effect of glucocorticoids and synthetic analogues, initial encounter: Secondary | ICD-10-CM

## 2017-05-28 LAB — GLUCOSE, CAPILLARY
GLUCOSE-CAPILLARY: 157 mg/dL — AB (ref 65–99)
GLUCOSE-CAPILLARY: 138 mg/dL — AB (ref 65–99)
GLUCOSE-CAPILLARY: 129 mg/dL — AB (ref 65–99)
GLUCOSE-CAPILLARY: 138 mg/dL — AB (ref 65–99)
Glucose-Capillary: 113 mg/dL — ABNORMAL HIGH (ref 65–99)
Glucose-Capillary: 142 mg/dL — ABNORMAL HIGH (ref 65–99)
Glucose-Capillary: 157 mg/dL — ABNORMAL HIGH (ref 65–99)

## 2017-05-28 LAB — PHOSPHORUS: Phosphorus: 3.1 mg/dL (ref 2.5–4.6)

## 2017-05-28 LAB — DIFFERENTIAL
BASOS PCT: 0 %
Basophils Absolute: 0 10*3/uL (ref 0.0–0.1)
EOS ABS: 0 10*3/uL (ref 0.0–0.7)
EOS PCT: 0 %
Lymphocytes Relative: 26 %
Lymphs Abs: 3.9 10*3/uL (ref 0.7–4.0)
MONO ABS: 1.4 10*3/uL — AB (ref 0.1–1.0)
Monocytes Relative: 9 %
NEUTROS ABS: 9.8 10*3/uL — AB (ref 1.7–7.7)
Neutrophils Relative %: 65 %

## 2017-05-28 LAB — COMPREHENSIVE METABOLIC PANEL
ALBUMIN: 3.5 g/dL (ref 3.5–5.0)
ALK PHOS: 329 U/L — AB (ref 38–126)
ALT: 50 U/L (ref 17–63)
AST: 21 U/L (ref 15–41)
Anion gap: 8 (ref 5–15)
BILIRUBIN TOTAL: 1.2 mg/dL (ref 0.3–1.2)
BUN: 25 mg/dL — AB (ref 6–20)
CALCIUM: 9.5 mg/dL (ref 8.9–10.3)
CO2: 22 mmol/L (ref 22–32)
CREATININE: 0.78 mg/dL (ref 0.61–1.24)
Chloride: 104 mmol/L (ref 101–111)
GFR calc Af Amer: >60 mL/min (ref >60)
GLUCOSE: 132 mg/dL — AB (ref 65–99)
Potassium: 3.7 mmol/L (ref 3.5–5.1)
Sodium: 134 mmol/L — ABNORMAL LOW (ref 135–145)
TOTAL PROTEIN: 8 g/dL (ref 6.5–8.1)

## 2017-05-28 LAB — CBC
HEMATOCRIT: 40.7 % (ref 39.0–52.0)
Hemoglobin: 13.9 g/dL (ref 13.0–17.0)
MCH: 29.1 pg (ref 26.0–34.0)
MCHC: 34.2 g/dL (ref 30.0–36.0)
MCV: 85.3 fL (ref 78.0–100.0)
PLATELETS: 657 10*3/uL — AB (ref 150–400)
RBC: 4.77 MIL/uL (ref 4.22–5.81)
RDW: 14 % (ref 11.5–15.5)
WBC: 15.1 10*3/uL — AB (ref 4.0–10.5)

## 2017-05-28 LAB — TRIGLYCERIDES: Triglycerides: 71 mg/dL (ref <150)

## 2017-05-28 LAB — MAGNESIUM: Magnesium: 2.4 mg/dL (ref 1.7–2.4)

## 2017-05-28 LAB — PREALBUMIN: PREALBUMIN: 27.7 mg/dL (ref 18–38)

## 2017-05-28 MED ORDER — CALCIUM GLUCONATE 10 % IV SOLN
INTRAVENOUS | Status: AC
  Administered 2017-05-28: 18:00:00 via INTRAVENOUS
  Filled 2017-05-28: qty 1203.6
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
## 2017-05-28 NOTE — Progress Notes (Signed)
Modified Barium Swallow Progress Note  Patient Details  Name: Patrick [name] MRN: [id number] Date of Birth: Sep 15, 1965  Today's Date: 05/28/2017  Modified Barium Swallow completed.  Full report located under Chart Review in the Imaging Section.  Brief recommendations include the following:  Clinical Impression  Patient presents with a largely normal oropharyngeal swallow. Full airway protection noted with the exception of single episode of high penetration of thin liquids when combined with pill. Note that patient impulsive and with fluctuating levels of alertness, attention, and awareness, all of which may impact swallowing function and increase risk of aspiration. Patient may however at this time advance diet with SLP f/u for diet tolerance and reinforced use of compensatory strategies/aspiration precautions.    Swallow Evaluation Recommendations       SLP Diet Recommendations: Regular solids;Thin liquid   Liquid Administration via: Cup;Straw   Medication Administration: Whole meds with puree   Supervision: Patient able to self feed;Full supervision/cueing for compensatory strategies   Compensations: Slow rate;Small sips/bites;Minimize environmental distractions   Postural Changes: Seated upright at 90 degrees   Oral Care Recommendations: Oral care BID     Patrick LangoLeah Miyoshi Ligas MA, CCC-SLP [phone]    Patrick [name] 05/28/2017,2:00 PM
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
## 2017-05-29 ENCOUNTER — Inpatient Hospital Stay (HOSPITAL_COMMUNITY): Payer: No Typology Code available for payment source

## 2017-05-29 LAB — CBC
HCT: 41.2 % (ref 39.0–52.0)
HEMOGLOBIN: 13.7 g/dL (ref 13.0–17.0)
MCH: 29 pg (ref 26.0–34.0)
MCHC: 33.3 g/dL (ref 30.0–36.0)
MCV: 87.1 fL (ref 78.0–100.0)
PLATELETS: 609 10*3/uL — AB (ref 150–400)
RBC: 4.73 MIL/uL (ref 4.22–5.81)
RDW: 14.1 % (ref 11.5–15.5)
WBC: 16.9 10*3/uL — ABNORMAL HIGH (ref 4.0–10.5)

## 2017-05-29 LAB — GLUCOSE, CAPILLARY
GLUCOSE-CAPILLARY: 113 mg/dL — AB (ref 65–99)
GLUCOSE-CAPILLARY: 134 mg/dL — AB (ref 65–99)
GLUCOSE-CAPILLARY: 135 mg/dL — AB (ref 65–99)
Glucose-Capillary: 127 mg/dL — ABNORMAL HIGH (ref 65–99)
Glucose-Capillary: 139 mg/dL — ABNORMAL HIGH (ref 65–99)
Glucose-Capillary: 146 mg/dL — ABNORMAL HIGH (ref 65–99)
Glucose-Capillary: 173 mg/dL — ABNORMAL HIGH (ref 65–99)

## 2017-05-29 MED ORDER — POTASSIUM CHLORIDE 10 MEQ/50ML IV SOLN
INTRAVENOUS | Status: AC
Start: 2017-05-29 — End: 2017-05-30
  Filled 2017-05-29: qty 50

## 2017-05-29 MED ORDER — TRAVASOL 10 % IV SOLN
INTRAVENOUS | Status: AC
  Administered 2017-05-29: 18:00:00 via INTRAVENOUS
  Filled 2017-05-29: qty 1203.6

## 2017-05-29 MED ORDER — POTASSIUM CHLORIDE 10 MEQ/100ML IV SOLN
INTRAVENOUS | Status: AC
  Filled 2017-05-29: qty 100
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
## 2017-05-30 ENCOUNTER — Inpatient Hospital Stay (HOSPITAL_COMMUNITY): Payer: No Typology Code available for payment source

## 2017-05-30 DIAGNOSIS — Z01810 Encounter for preprocedural cardiovascular examination: Secondary | ICD-10-CM

## 2017-05-30 LAB — BASIC METABOLIC PANEL
ANION GAP: 7 (ref 5–15)
BUN: 25 mg/dL — ABNORMAL HIGH (ref 6–20)
CALCIUM: 8.9 mg/dL (ref 8.9–10.3)
CO2: 22 mmol/L (ref 22–32)
CREATININE: 0.65 mg/dL (ref 0.61–1.24)
Chloride: 105 mmol/L (ref 101–111)
Glucose, Bld: 156 mg/dL — ABNORMAL HIGH (ref 65–99)
Potassium: 4.3 mmol/L (ref 3.5–5.1)
Sodium: 134 mmol/L — ABNORMAL LOW (ref 135–145)

## 2017-05-30 LAB — GLUCOSE, CAPILLARY
GLUCOSE-CAPILLARY: 110 mg/dL — AB (ref 65–99)
GLUCOSE-CAPILLARY: 119 mg/dL — AB (ref 65–99)
GLUCOSE-CAPILLARY: 148 mg/dL — AB (ref 65–99)
GLUCOSE-CAPILLARY: 167 mg/dL — AB (ref 65–99)
GLUCOSE-CAPILLARY: 94 mg/dL (ref 65–99)
Glucose-Capillary: 128 mg/dL — ABNORMAL HIGH (ref 65–99)
Glucose-Capillary: 152 mg/dL — ABNORMAL HIGH (ref 65–99)
Glucose-Capillary: 107 mg/dL — ABNORMAL HIGH (ref 65–99)

## 2017-05-30 LAB — PHOSPHORUS: PHOSPHORUS: 3.4 mg/dL (ref 2.5–4.6)

## 2017-05-30 LAB — MAGNESIUM: Magnesium: 2.1 mg/dL (ref 1.7–2.4)

## 2017-05-30 MED ORDER — TRAVASOL 10 % IV SOLN
INTRAVENOUS | Status: DC
  Filled 2017-05-30: qty 1326

## 2017-05-30 MED ORDER — ENSURE ENLIVE PO LIQD
237.0000 mL | Freq: Two times a day (BID) | ORAL | Status: DC
  Administered 2017-05-30 – 2017-06-06 (×11): 237 mL via ORAL

## 2017-05-30 MED ORDER — DEXAMETHASONE SODIUM PHOSPHATE 10 MG/ML IJ SOLN
5.0000 mg | Freq: Two times a day (BID) | INTRAMUSCULAR | Status: AC
  Administered 2017-05-30 – 2017-06-01 (×4): 5 mg via INTRAVENOUS
  Filled 2017-05-30 (×4): qty 1

## 2017-05-30 MED ORDER — PANTOPRAZOLE SODIUM 40 MG PO TBEC
40.0000 mg | DELAYED_RELEASE_TABLET | Freq: Every day | ORAL | Status: DC
  Administered 2017-05-31 – 2017-06-06 (×7): 40 mg via ORAL
  Filled 2017-05-30 (×7): qty 1

## 2017-05-30 NOTE — Progress Notes (Signed)
TPN d/c but tubing removed by IV team so unable to to titrate. RN will continue to monitor patient CBG closely and monitor for hypoglycemic event. Report provided to oncoming RN.
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
## 2017-05-30 NOTE — Progress Notes (Signed)
Right upper extremity venous duplex has been completed. Negative for DVT. Results were given to the patient's nurse, Brooke.  05/30/17 11:10 AM Olen CordialGreg Jakyah Bradby RVT
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
## 2017-05-31 LAB — GLUCOSE, CAPILLARY
Glucose-Capillary: 123 mg/dL — ABNORMAL HIGH (ref 65–99)
Glucose-Capillary: 104 mg/dL — ABNORMAL HIGH (ref 65–99)
Glucose-Capillary: 94 mg/dL (ref 65–99)
Glucose-Capillary: 168 mg/dL — ABNORMAL HIGH (ref 65–99)

## 2017-05-31 LAB — CBC WITH DIFFERENTIAL/PLATELET
Basophils Absolute: 0 10*3/uL (ref 0.0–0.1)
Basophils Relative: 0 %
EOS ABS: 0 10*3/uL (ref 0.0–0.7)
EOS PCT: 0 %
HCT: 41.4 % (ref 39.0–52.0)
HEMOGLOBIN: 13.7 g/dL (ref 13.0–17.0)
Lymphocytes Relative: 16 %
Lymphs Abs: 2.8 10*3/uL (ref 0.7–4.0)
MCH: 29 pg (ref 26.0–34.0)
MCHC: 33.1 g/dL (ref 30.0–36.0)
MCV: 87.7 fL (ref 78.0–100.0)
MONOS PCT: 4 %
Monocytes Absolute: 0.7 10*3/uL (ref 0.1–1.0)
Neutro Abs: 14.2 10*3/uL — ABNORMAL HIGH (ref 1.7–7.7)
Neutrophils Relative %: 80 %
PLATELETS: 479 10*3/uL — AB (ref 150–400)
RBC: 4.72 MIL/uL (ref 4.22–5.81)
RDW: 14 % (ref 11.5–15.5)
WBC: 17.7 10*3/uL — ABNORMAL HIGH (ref 4.0–10.5)
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
## 2017-05-31 NOTE — Progress Notes (Signed)
CCMD called abt pt's heart rate going into the 140's. Nurse went into pt's room to check on pt the first time and pt was trying to get out of bed. The second time nurse was called, nurse entered the room to find the pt masturbating. Nurse left room and will cont. To monitor

## 2017-05-31 NOTE — Plan of Care (Signed)
Patient having increased anxiety, very tearful.  Patient states that he wants to go home, will encourage patient to express his feelings

## 2017-06-01 LAB — GLUCOSE, CAPILLARY
GLUCOSE-CAPILLARY: 106 mg/dL — AB (ref 65–99)
GLUCOSE-CAPILLARY: 120 mg/dL — AB (ref 65–99)

## 2017-06-01 MED ORDER — DOCUSATE SODIUM 50 MG/5ML PO LIQD
100.0000 mg | Freq: Two times a day (BID) | ORAL | Status: DC
  Administered 2017-06-01 – 2017-06-06 (×10): 100 mg via ORAL
  Filled 2017-06-01 (×10): qty 10

## 2017-06-01 MED ORDER — DOCUSATE SODIUM 50 MG/5ML PO LIQD: 100.0000 mg | Freq: Two times a day (BID) | ORAL | Status: DC
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
## 2017-06-01 NOTE — Progress Notes (Signed)
Rehab admissions - I am following for potential acute inpatient rehab admission.  Currently rehab beds are full.  I will contact family on Monday to discuss rehab venues.  Call me for questions.  #[phone]

## 2017-06-01 NOTE — Progress Notes (Signed)
Orthopedic Tech Progress Note Patient Details:  Patrick [name] [name] 05/12/1966 [id number]  Ortho Devices Type of Ortho Device: Arm sling Ortho Device/Splint Location: rue Ortho Device/Splint Interventions: Ordered, Application, Adjustment   Post Interventions Patient Tolerated: Well Instructions Provided: Care of device, Adjustment of device   Saul FordyceJennifer C Talayla Doyel 06/01/2017, 11:59 AM

## 2017-06-01 NOTE — Clinical Social Work Note (Signed)
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  At this time, patient mental status is not yet appropriate to complete SBIRT assessment.  CIR consult in and inpatient admissions coordinator to follow up.  CSW available as needed.  Macario GoldsJesse Danni Shima, [place] [phone]
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
## 2017-06-02 MED ORDER — ACETAMINOPHEN 160 MG/5ML PO SOLN
650.0000 mg | Freq: Four times a day (QID) | ORAL | Status: DC | PRN
  Administered 2017-06-02 – 2017-06-03 (×2): 650 mg via ORAL
  Filled 2017-06-02 (×2): qty 20.3

## 2017-06-02 MED ORDER — OXYCODONE HCL 5 MG PO TABS
5.0000 mg | ORAL_TABLET | ORAL | Status: DC | PRN
  Administered 2017-06-02 – 2017-06-06 (×12): 5 mg via ORAL
  Filled 2017-06-02 (×12): qty 1
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
## 2017-06-03 LAB — GLUCOSE, CAPILLARY
GLUCOSE-CAPILLARY: 85 mg/dL (ref 65–99)
GLUCOSE-CAPILLARY: 84 mg/dL (ref 65–99)

## 2017-06-03 NOTE — Progress Notes (Signed)
18 Days Post-Op   Subjective/Chief Complaint: No acute changes   Objective: Vital signs in last 24 hours: Temp:  [97.1 F (36.2 C)-97.8 F (36.6 C)] 97.6 F (36.4 C) (01/06 0341) Pulse Rate:  [79-98] 79 (01/06 0745) Resp:  [12-25] 16 (01/06 0745) BP: (99-107)/(65-97) 107/97 (01/06 0745) SpO2:  [92 %-100 %] 98 % (01/06 0745) Last BM Date: 05/29/17(colace given)  Intake/Output from previous day: 01/05 0701 - 01/06 0700 In: 600 [P.O.:600] Out: 300 [Urine:300] Intake/Output this shift: No intake/output data recorded.  Exam: Awake, follows commands, confused Lungs clear CV RRR  Lab Results:  No results for input(s): WBC, HGB, HCT, PLT in the last 72 hours. BMET No results for input(s): NA, K, CL, CO2, GLUCOSE, BUN, CREATININE, CALCIUM in the last 72 hours. PT/INR No results for input(s): LABPROT, INR in the last 72 hours. ABG No results for input(s): PHART, HCO3 in the last 72 hours.  Invalid input(s): PCO2, PO2  Studies/Results: No results found.  Anti-infectives: Anti-infectives (From admission, onward)   Start     Dose/Rate Route Frequency Ordered Stop   05/15/17 0900  ceFAZolin (ANCEF) IVPB 1 g/50 mL premix     1 g 100 mL/hr over 30 Minutes Intravenous Every 8 hours 05/15/17 0822 05/21/17 2300   05/13/17 0800  vancomycin (VANCOCIN) 1,500 mg in sodium chloride 0.9 % 500 mL IVPB  Status:  Discontinued     1,500 mg 250 mL/hr over 120 Minutes Intravenous Every 8 hours 05/13/17 0439 05/15/17 0828   05/11/17 1130  piperacillin-tazobactam (ZOSYN) IVPB 3.375 g  Status:  Discontinued     3.375 g 12.5 mL/hr over 240 Minutes Intravenous Every 8 hours 05/11/17 1101 05/15/17 0828   05/11/17 1130  vancomycin (VANCOCIN) IVPB 1000 mg/200 mL premix  Status:  Discontinued     1,000 mg 200 mL/hr over 60 Minutes Intravenous Every 8 hours 05/11/17 1101 05/13/17 0439   05/08/17 2300  ceFAZolin (ANCEF) IVPB 1 g/50 mL premix     1 g 100 mL/hr over 30 Minutes Intravenous Every 8  hours 05/08/17 1548 05/09/17 1517   05/08/17 1403  vancomycin (VANCOCIN) powder  Status:  Discontinued       As needed 05/08/17 1403 05/08/17 1521      Assessment/Plan: s/p Procedure(s): CLOSED REDUCTION NASAL FRACTURE (N/A)  PHBC TBI/B frontal ICC/depressed frontal bone FX- F/C, TBI team B orbit/ethmoid/crib plate/nasal FXs- Dr. Jenne PaneBates did bedside CR nasal FX 12/19, Dr. [name] following R BB FA FX- S/P ORIF by Dr. [name] 12/11 Acute hypercarbic vent dependent resp failure- extubated 12/28 PSA- positive for meth and THC, CSW evaluating R knee edema- WBAT R foot x-ray neg  ID- no current abx;  VTE- PAS, Lovenox FEN- regular diet  Waiting for a Rehab bed when available  LOS: 26 days    Patrick [name] A 06/03/2017

## 2017-06-04 MED ORDER — POLYETHYLENE GLYCOL 3350 17 G PO PACK
17.0000 g | PACK | Freq: Every day | ORAL | Status: DC
  Administered 2017-06-04 – 2017-06-06 (×3): 17 g via ORAL
  Filled 2017-06-04 (×3): qty 1

## 2017-06-04 MED ORDER — CLONAZEPAM 0.25 MG PO TBDP
0.2500 mg | ORAL_TABLET | Freq: Two times a day (BID) | ORAL | Status: DC
  Administered 2017-06-04 – 2017-06-05 (×4): 0.25 mg via ORAL
  Filled 2017-06-04 (×4): qty 1

## 2017-06-04 MED ORDER — QUETIAPINE FUMARATE 50 MG PO TABS
25.0000 mg | ORAL_TABLET | Freq: Two times a day (BID) | ORAL | Status: DC
  Administered 2017-06-04 (×2): 25 mg via ORAL
  Filled 2017-06-04 (×2): qty 1
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
## 2017-06-04 NOTE — Progress Notes (Signed)
Rehab admissions - I called and spoke with patient's daughter, Herbert SetaHeather.  Daughter and daughter's mother plan to assist patient after rehab in daughter's home.  Patient was alert and following commands for me on rounds.  Currently rehab beds are full.  I will try to get a bed for patient over the next few days as I able.  Call me for questions.  [phone]

## 2017-06-04 NOTE — Progress Notes (Signed)
[facility] Progress Note  19 Days Post-Op  Subjective: CC: frustrated Patient frustrated with progress. Tearful at times. Complaining of nausea this AM and pain in R arm. Per family member at bedside has not had a BM in several days.  UOP good. VSS.   Objective: Vital signs in last 24 hours: Temp:  [97.1 F (36.2 C)-98.3 F (36.8 C)] 98 F (36.7 C) (01/07 0357) Pulse Rate:  [73-105] 79 (01/07 0327) Resp:  [11-21] 16 (01/07 0327) BP: (95-116)/(68-78) 95/78 (01/07 0327) SpO2:  [93 %-97 %] 93 % (01/07 0327) Last BM Date: 05/29/17  Intake/Output from previous day: 01/06 0701 - 01/07 0700 In: 610 [P.O.:600; I.V.:10] Out: 603 [Urine:603] Intake/Output this shift: No intake/output data recorded.  PE: Gen:  Resting, NAD HEENT: depressed skull fracture Card:  Regular rate and rhythm, pedal pulses 2+ BL Pulm:  Normal effort, clear to auscultation bilaterally Abd: Soft, non-tender, non-distended, +BS Neuro: speech clear although at times non-sensical, following commands Skin: warm and dry, no rashes  Psych: A&Ox3, seems depressed    Lab Results:  No results for input(s): WBC, HGB, HCT, PLT in the last 72 hours. BMET No results for input(s): NA, K, CL, CO2, GLUCOSE, BUN, CREATININE, CALCIUM in the last 72 hours. PT/INR No results for input(s): LABPROT, INR in the last 72 hours. CMP     Component Value Date/Time   NA 134 (L) 05/30/2017 0608   K 4.3 05/30/2017 0608   CL 105 05/30/2017 0608   CO2 22 05/30/2017 0608   GLUCOSE 156 (H) 05/30/2017 0608   BUN 25 (H) 05/30/2017 0608   CREATININE 0.65 05/30/2017 0608   CALCIUM 8.9 05/30/2017 0608   PROT 8.0 05/28/2017 1048   ALBUMIN 3.5 05/28/2017 1048   AST 21 05/28/2017 1048   ALT 50 05/28/2017 1048   ALKPHOS 329 (H) 05/28/2017 1048   BILITOT 1.2 05/28/2017 1048   GFRNONAA >60 05/30/2017 0608   GFRAA >60 05/30/2017 [id number]   Lipase  No results found for: LIPASE     Studies/Results: No results  found.  Anti-infectives: Anti-infectives (From admission, onward)   Start     Dose/Rate Route Frequency Ordered Stop   05/15/17 0900  ceFAZolin (ANCEF) IVPB 1 g/50 mL premix     1 g 100 mL/hr over 30 Minutes Intravenous Every 8 hours 05/15/17 0822 05/21/17 2300   05/13/17 0800  vancomycin (VANCOCIN) 1,500 mg in sodium chloride 0.9 % 500 mL IVPB  Status:  Discontinued     1,500 mg 250 mL/hr over 120 Minutes Intravenous Every 8 hours 05/13/17 0439 05/15/17 0828   05/11/17 1130  piperacillin-tazobactam (ZOSYN) IVPB 3.375 g  Status:  Discontinued     3.375 g 12.5 mL/hr over 240 Minutes Intravenous Every 8 hours 05/11/17 1101 05/15/17 0828   05/11/17 1130  vancomycin (VANCOCIN) IVPB 1000 mg/200 mL premix  Status:  Discontinued     1,000 mg 200 mL/hr over 60 Minutes Intravenous Every 8 hours 05/11/17 1101 05/13/17 0439   05/08/17 2300  ceFAZolin (ANCEF) IVPB 1 g/50 mL premix     1 g 100 mL/hr over 30 Minutes Intravenous Every 8 hours 05/08/17 1548 05/09/17 1517   05/08/17 1403  vancomycin (VANCOCIN) powder  Status:  Discontinued       As needed 05/08/17 1403 05/08/17 1521       Assessment/Plan PHBC TBI/B frontal ICC/depressed frontal bone FX- F/C, TBI team B orbit/ethmoid/crib plate/nasal FXs- Dr. Jenne PaneBates did bedside CR nasal FX 12/19, Dr. [name] following R BB  FA FX- S/P ORIF by Dr. Jena Gauss 12/11 Acute hypercarbic vent dependent resp failure- extubated 12/28 PSA- positive for meth and THC, CSW evaluating R knee edema- WBAT R foot x-ray neg  ID- no current abx;  VTE- PAS, Lovenox FEN- regular diet; bowel regimen  Dispo- to CIR hopefully this week. Weaning off seroquel/klonopin     LOS: 27 days    Wells Guiles , [facility] 06/04/2017, 8:17 AM Pager: [phone] Trauma Pager: [phone] Mon-Fri 7:00 am-4:30 pm Sat-Sun 7:00 am-11:30 am
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
## 2017-06-05 LAB — GLUCOSE, CAPILLARY: Glucose-Capillary: 107 mg/dL — ABNORMAL HIGH (ref 65–99)
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
## 2017-06-05 NOTE — Progress Notes (Signed)
Rehab admissions - currently rehab beds are full.  Awaiting bed availability for this patient.  Call me for questions.  #[phone]
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
## 2017-06-06 ENCOUNTER — Inpatient Hospital Stay (HOSPITAL_COMMUNITY): Payer: No Typology Code available for payment source

## 2017-06-06 ENCOUNTER — Other Ambulatory Visit: Payer: Self-pay

## 2017-06-06 ENCOUNTER — Inpatient Hospital Stay (HOSPITAL_COMMUNITY)
Admission: RE | Admit: 2017-06-06 | Discharge: 2017-07-06 | DRG: 945 | Disposition: A | Discharge status: Home Health | Payer: No Typology Code available for payment source | Source: Intra-hospital | Attending: Physical Medicine & Rehabilitation | Admitting: Physical Medicine & Rehabilitation

## 2017-06-06 DIAGNOSIS — E871 Hypo-osmolality and hyponatremia: Secondary | ICD-10-CM | POA: Diagnosis present

## 2017-06-06 DIAGNOSIS — T148XXA Other injury of unspecified body region, initial encounter: Secondary | ICD-10-CM

## 2017-06-06 DIAGNOSIS — I609 Nontraumatic subarachnoid hemorrhage, unspecified: Secondary | ICD-10-CM

## 2017-06-06 DIAGNOSIS — B9620 Unspecified Escherichia coli [E. coli] as the cause of diseases classified elsewhere: Secondary | ICD-10-CM | POA: Diagnosis not present

## 2017-06-06 DIAGNOSIS — R451 Restlessness and agitation: Secondary | ICD-10-CM | POA: Diagnosis present

## 2017-06-06 DIAGNOSIS — F419 Anxiety disorder, unspecified: Secondary | ICD-10-CM | POA: Diagnosis present

## 2017-06-06 DIAGNOSIS — K7200 Acute and subacute hepatic failure without coma: Secondary | ICD-10-CM | POA: Diagnosis present

## 2017-06-06 DIAGNOSIS — F17210 Nicotine dependence, cigarettes, uncomplicated: Secondary | ICD-10-CM | POA: Diagnosis present

## 2017-06-06 DIAGNOSIS — N390 Urinary tract infection, site not specified: Secondary | ICD-10-CM | POA: Diagnosis not present

## 2017-06-06 DIAGNOSIS — G8918 Other acute postprocedural pain: Secondary | ICD-10-CM

## 2017-06-06 DIAGNOSIS — F411 Generalized anxiety disorder: Secondary | ICD-10-CM

## 2017-06-06 DIAGNOSIS — S0291XS Unspecified fracture of skull, sequela: Secondary | ICD-10-CM

## 2017-06-06 DIAGNOSIS — R4189 Other symptoms and signs involving cognitive functions and awareness: Secondary | ICD-10-CM | POA: Diagnosis present

## 2017-06-06 DIAGNOSIS — S022XXD Fracture of nasal bones, subsequent encounter for fracture with routine healing: Secondary | ICD-10-CM | POA: Diagnosis not present

## 2017-06-06 DIAGNOSIS — S062X9A Diffuse traumatic brain injury with loss of consciousness of unspecified duration, initial encounter: Secondary | ICD-10-CM | POA: Diagnosis present

## 2017-06-06 DIAGNOSIS — S52221D Displaced transverse fracture of shaft of right ulna, subsequent encounter for closed fracture with routine healing: Secondary | ICD-10-CM

## 2017-06-06 DIAGNOSIS — R4586 Emotional lability: Secondary | ICD-10-CM

## 2017-06-06 DIAGNOSIS — D72829 Elevated white blood cell count, unspecified: Secondary | ICD-10-CM | POA: Diagnosis present

## 2017-06-06 DIAGNOSIS — F1290 Cannabis use, unspecified, uncomplicated: Secondary | ICD-10-CM | POA: Diagnosis present

## 2017-06-06 DIAGNOSIS — S0291XA Unspecified fracture of skull, initial encounter for closed fracture: Secondary | ICD-10-CM | POA: Diagnosis present

## 2017-06-06 DIAGNOSIS — Z7409 Other reduced mobility: Secondary | ICD-10-CM | POA: Diagnosis present

## 2017-06-06 DIAGNOSIS — T1490XA Injury, unspecified, initial encounter: Secondary | ICD-10-CM

## 2017-06-06 DIAGNOSIS — S52321D Displaced transverse fracture of shaft of right radius, subsequent encounter for closed fracture with routine healing: Secondary | ICD-10-CM

## 2017-06-06 DIAGNOSIS — S066X9D Traumatic subarachnoid hemorrhage with loss of consciousness of unspecified duration, subsequent encounter: Secondary | ICD-10-CM | POA: Diagnosis present

## 2017-06-06 DIAGNOSIS — F1910 Other psychoactive substance abuse, uncomplicated: Secondary | ICD-10-CM

## 2017-06-06 DIAGNOSIS — S83519S Sprain of anterior cruciate ligament of unspecified knee, sequela: Secondary | ICD-10-CM

## 2017-06-06 MED ORDER — FLEET ENEMA 7-19 GM/118ML RE ENEM: 1.0000 | ENEMA | Freq: Once | RECTAL | Status: DC | PRN

## 2017-06-06 MED ORDER — METHOCARBAMOL 500 MG PO TABS
500.0000 mg | ORAL_TABLET | Freq: Four times a day (QID) | ORAL | Status: DC | PRN
  Administered 2017-06-14 – 2017-06-29 (×5): 500 mg via ORAL
  Filled 2017-06-06 (×5): qty 1

## 2017-06-06 MED ORDER — DIPHENHYDRAMINE HCL 12.5 MG/5ML PO ELIX
12.5000 mg | ORAL_SOLUTION | Freq: Four times a day (QID) | ORAL | Status: DC | PRN
  Administered 2017-06-25: 25 mg via ORAL
  Filled 2017-06-06: qty 10

## 2017-06-06 MED ORDER — OXYCODONE HCL 5 MG PO TABS
5.0000 mg | ORAL_TABLET | ORAL | Status: DC | PRN
  Administered 2017-06-07 – 2017-06-08 (×4): 5 mg via ORAL
  Filled 2017-06-06 (×4): qty 1

## 2017-06-06 MED ORDER — ONDANSETRON 4 MG PO TBDP
4.0000 mg | ORAL_TABLET | Freq: Four times a day (QID) | ORAL | Status: DC | PRN
  Administered 2017-06-06 – 2017-06-21 (×4): 4 mg via ORAL
  Filled 2017-06-06 (×6): qty 1

## 2017-06-06 MED ORDER — SENNOSIDES-DOCUSATE SODIUM 8.6-50 MG PO TABS
2.0000 | ORAL_TABLET | Freq: Every evening | ORAL | Status: DC | PRN
  Administered 2017-07-05: 2 via ORAL
  Filled 2017-06-06: qty 2

## 2017-06-06 MED ORDER — OXYCODONE HCL 5 MG PO TABS: 5.0000 mg | ORAL_TABLET | ORAL | Status: DC | PRN

## 2017-06-06 MED ORDER — POLYETHYLENE GLYCOL 3350 17 G PO PACK: 17.0000 g | PACK | Freq: Every day | ORAL | Status: DC | PRN

## 2017-06-06 MED ORDER — PANTOPRAZOLE SODIUM 40 MG PO TBEC
40.0000 mg | DELAYED_RELEASE_TABLET | Freq: Every day | ORAL | Status: DC
  Administered 2017-06-07 – 2017-07-05 (×29): 40 mg via ORAL
  Filled 2017-06-06 (×28): qty 1

## 2017-06-06 MED ORDER — BISACODYL 10 MG RE SUPP: 10.0000 mg | Freq: Every day | RECTAL | Status: DC | PRN

## 2017-06-06 MED ORDER — PROCHLORPERAZINE EDISYLATE 5 MG/ML IJ SOLN: 5.0000 mg | Freq: Four times a day (QID) | INTRAMUSCULAR | Status: DC | PRN

## 2017-06-06 MED ORDER — GUAIFENESIN-DM 100-10 MG/5ML PO SYRP: 5.0000 mL | ORAL_SOLUTION | Freq: Four times a day (QID) | ORAL | Status: DC | PRN

## 2017-06-06 MED ORDER — CLONAZEPAM 0.25 MG PO TBDP
0.2500 mg | ORAL_TABLET | Freq: Every evening | ORAL | Status: DC | PRN
  Administered 2017-06-15 – 2017-06-17 (×2): 0.25 mg via ORAL
  Filled 2017-06-06 (×3): qty 1

## 2017-06-06 MED ORDER — LORAZEPAM 2 MG/ML IJ SOLN: 1.0000 mg | Freq: Four times a day (QID) | INTRAMUSCULAR | Status: DC | PRN

## 2017-06-06 MED ORDER — PROCHLORPERAZINE 25 MG RE SUPP: 12.5000 mg | Freq: Four times a day (QID) | RECTAL | Status: DC | PRN

## 2017-06-06 MED ORDER — TRAMADOL HCL 50 MG PO TABS
50.0000 mg | ORAL_TABLET | Freq: Four times a day (QID) | ORAL | Status: DC | PRN
  Administered 2017-06-08 – 2017-07-03 (×34): 50 mg via ORAL
  Filled 2017-06-06 (×35): qty 1

## 2017-06-06 MED ORDER — TRAZODONE HCL 50 MG PO TABS
25.0000 mg | ORAL_TABLET | Freq: Every evening | ORAL | Status: DC | PRN
  Administered 2017-06-16 – 2017-06-18 (×2): 50 mg via ORAL
  Administered 2017-06-19: 25 mg via ORAL
  Administered 2017-06-20 – 2017-07-05 (×8): 50 mg via ORAL
  Filled 2017-06-06 (×11): qty 1

## 2017-06-06 MED ORDER — ALUM & MAG HYDROXIDE-SIMETH 200-200-20 MG/5ML PO SUSP
30.0000 mL | ORAL | Status: DC | PRN
  Administered 2017-07-02 (×2): 30 mL via ORAL
  Filled 2017-06-06 (×2): qty 30

## 2017-06-06 MED ORDER — ONDANSETRON HCL 4 MG/2ML IJ SOLN: 4.0000 mg | Freq: Four times a day (QID) | INTRAMUSCULAR | Status: DC | PRN

## 2017-06-06 MED ORDER — POLYETHYLENE GLYCOL 3350 17 G PO PACK
17.0000 g | PACK | Freq: Every day | ORAL | Status: DC
  Administered 2017-06-07 – 2017-07-05 (×26): 17 g via ORAL
  Filled 2017-06-06 (×27): qty 1

## 2017-06-06 MED ORDER — PROCHLORPERAZINE MALEATE 5 MG PO TABS
5.0000 mg | ORAL_TABLET | Freq: Four times a day (QID) | ORAL | Status: DC | PRN
  Administered 2017-06-08: 10 mg via ORAL
  Administered 2017-06-12: 5 mg via ORAL
  Administered 2017-06-13: 10 mg via ORAL
  Filled 2017-06-06 (×4): qty 2

## 2017-06-06 MED ORDER — ACETAMINOPHEN 325 MG PO TABS
325.0000 mg | ORAL_TABLET | ORAL | Status: DC | PRN
  Administered 2017-06-06: 650 mg via ORAL
  Administered 2017-06-06: 325 mg via ORAL
  Administered 2017-06-07 – 2017-07-06 (×12): 650 mg via ORAL
  Filled 2017-06-06 (×15): qty 2

## 2017-06-06 MED ORDER — QUETIAPINE FUMARATE 25 MG PO TABS
25.0000 mg | ORAL_TABLET | Freq: Three times a day (TID) | ORAL | Status: DC | PRN
  Administered 2017-06-06 – 2017-06-07 (×2): 25 mg via ORAL
  Filled 2017-06-06 (×2): qty 1

## 2017-06-06 MED ORDER — CLONAZEPAM 0.25 MG PO TBDP: 0.2500 mg | ORAL_TABLET | Freq: Every evening | ORAL | Status: DC | PRN

## 2017-06-06 MED ORDER — ENOXAPARIN SODIUM 40 MG/0.4ML ~~LOC~~ SOLN
40.0000 mg | SUBCUTANEOUS | Status: DC
  Administered 2017-06-07 – 2017-07-05 (×29): 40 mg via SUBCUTANEOUS
  Filled 2017-06-06 (×29): qty 0.4

## 2017-06-06 NOTE — Progress Notes (Signed)
Patient ID: [name], male   DOB: 09-02-1965, 52 y.o.   MRN: [id number] Admit to unit via recliner chair. A+O x1, cursing and threatening then"reported" Im "f" sick and Im going to throw up. Followed by "you are going to make me cry" don't understand why every hates me. Im the devil, dance with the devil, I am god, I have no friends but Jesus. Attempted to reorient without success. Dr. Allena KatzPatel attempted assessment and pt cannot focus on conversation. Was able to feed self after set up for lunch and given extra time solo/limited distractions. Pamelia HoitSharp, [place] Dominique B

## 2017-06-06 NOTE — Progress Notes (Signed)
[facility] Progress Note  21 Days Post-Op  Subjective: Resting in bed this morning, cooperative, no complaints of pain. Per RN, patient more agitated overnight.   Objective: Vital signs in last 24 hours: Temp:  [97.4 F (36.3 C)-98.5 F (36.9 C)] 97.6 F (36.4 C) (01/09 0404) Pulse Rate:  [81-106] 81 (01/09 0404) Resp:  [18-20] 20 (01/09 0404) BP: (98-137)/(67-85) 103/77 (01/09 0404) SpO2:  [96 %-98 %] 97 % (01/09 0404) Last BM Date: 06/04/16  Intake/Output from previous day: 01/08 0701 - 01/09 0700 In: 1090 [P.O.:1080; I.V.:10] Out: 1150 [Urine:1150] Intake/Output this shift: No intake/output data recorded.  PE: Gen:  Alert, NAD Card:  Regular rate and rhythm, DP/PT pulses 2+ BL Pulm:  Normal effort, clear to auscultation bilaterally Abd: Soft, non-tender, non-distended, bowel sounds present in all 4 quadrants Skin: warm and dry, no rashes, no peripheral edema, SCDs in place  Lab Results:  No results for input(s): WBC, HGB, HCT, PLT in the last 72 hours. BMET No results for input(s): NA, K, CL, CO2, GLUCOSE, BUN, CREATININE, CALCIUM in the last 72 hours. PT/INR No results for input(s): LABPROT, INR in the last 72 hours. CMP     Component Value Date/Time   NA 134 (L) 05/30/2017 0608   K 4.3 05/30/2017 0608   CL 105 05/30/2017 0608   CO2 22 05/30/2017 0608   GLUCOSE 156 (H) 05/30/2017 0608   BUN 25 (H) 05/30/2017 0608   CREATININE 0.65 05/30/2017 0608   CALCIUM 8.9 05/30/2017 0608   PROT 8.0 05/28/2017 1048   ALBUMIN 3.5 05/28/2017 1048   AST 21 05/28/2017 1048   ALT 50 05/28/2017 1048   ALKPHOS 329 (H) 05/28/2017 1048   BILITOT 1.2 05/28/2017 1048   GFRNONAA >60 05/30/2017 0608   GFRAA >60 05/30/2017 6962   Lipase  No results found for: LIPASE     Studies/Results: No results found.  Anti-infectives: Anti-infectives (From admission, onward)   Start     Dose/Rate Route Frequency Ordered Stop   05/15/17 0900  ceFAZolin (ANCEF) IVPB 1  g/50 mL premix     1 g 100 mL/hr over 30 Minutes Intravenous Every 8 hours 05/15/17 0822 05/21/17 2300   05/13/17 0800  vancomycin (VANCOCIN) 1,500 mg in sodium chloride 0.9 % 500 mL IVPB  Status:  Discontinued     1,500 mg 250 mL/hr over 120 Minutes Intravenous Every 8 hours 05/13/17 0439 05/15/17 0828   05/11/17 1130  piperacillin-tazobactam (ZOSYN) IVPB 3.375 g  Status:  Discontinued     3.375 g 12.5 mL/hr over 240 Minutes Intravenous Every 8 hours 05/11/17 1101 05/15/17 0828   05/11/17 1130  vancomycin (VANCOCIN) IVPB 1000 mg/200 mL premix  Status:  Discontinued     1,000 mg 200 mL/hr over 60 Minutes Intravenous Every 8 hours 05/11/17 1101 05/13/17 0439   05/08/17 2300  ceFAZolin (ANCEF) IVPB 1 g/50 mL premix     1 g 100 mL/hr over 30 Minutes Intravenous Every 8 hours 05/08/17 1548 05/09/17 1517   05/08/17 1403  vancomycin (VANCOCIN) powder  Status:  Discontinued       As needed 05/08/17 1403 05/08/17 1521       Assessment/Plan PHBC TBI/B frontal ICC/depressed frontal bone FX- F/C, TBI team B orbit/ethmoid/crib plate/nasal FXs- Dr. Jenne Pane did bedside CR nasal FX 12/19, Dr. Dione Booze following R BB FA FX- S/P ORIF by Dr. Jena Gauss 12/11 Acute hypercarbic vent dependent resp failure- extubated 12/28 PSA- positive for meth and THC, CSW evaluating R knee edema- WBAT  R foot x-ray neg  ID- no current abx;  VTE- PAS, Lovenox FEN- regular diet; bowel regimen  Dispo- to CIR hopefully this week. Klonopin qhs prn, will add PRN Ativan for agitation.     LOS: 29 days    Wells GuilesKelly Rayburn , [facility] 06/06/2017, 8:27 AM Pager: [phone] Trauma Pager: [phone] Mon-Fri 7:00 am-4:30 pm Sat-Sun 7:00 am-11:30 am
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
## 2017-06-06 NOTE — Clinical Social Work Note (Signed)
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  At this time, patient mental status is not yet appropriate to complete SBIRT assessment.  [facility] and plans to [facility] today.  Clinical Social Worker will sign off for now as social work intervention is no longer needed. Please consult us again if new need arises.  Macario GoldsJesse Geofrey Silliman, Kentucky[place]CS[place] [phone]

## 2017-06-07 ENCOUNTER — Inpatient Hospital Stay (HOSPITAL_COMMUNITY): Payer: No Typology Code available for payment source | Admitting: Speech Pathology

## 2017-06-07 ENCOUNTER — Inpatient Hospital Stay (HOSPITAL_COMMUNITY): Payer: Self-pay | Admitting: Occupational Therapy

## 2017-06-07 ENCOUNTER — Encounter (HOSPITAL_COMMUNITY): Payer: Self-pay

## 2017-06-07 ENCOUNTER — Inpatient Hospital Stay (HOSPITAL_COMMUNITY): Payer: No Typology Code available for payment source | Admitting: Occupational Therapy

## 2017-06-07 ENCOUNTER — Inpatient Hospital Stay (HOSPITAL_COMMUNITY): Payer: No Typology Code available for payment source | Admitting: Physical Therapy

## 2017-06-07 DIAGNOSIS — S06303S Unspecified focal traumatic brain injury with loss of consciousness of 1 hour to 5 hours 59 minutes, sequela: Secondary | ICD-10-CM

## 2017-06-07 DIAGNOSIS — S069X9S Unspecified intracranial injury with loss of consciousness of unspecified duration, sequela: Secondary | ICD-10-CM

## 2017-06-07 DIAGNOSIS — E871 Hypo-osmolality and hyponatremia: Secondary | ICD-10-CM

## 2017-06-07 DIAGNOSIS — F0281 Dementia in other diseases classified elsewhere with behavioral disturbance: Secondary | ICD-10-CM

## 2017-06-07 LAB — CBC WITH DIFFERENTIAL/PLATELET
BASOS ABS: 0 10*3/uL (ref 0.0–0.1)
BASOS PCT: 0 %
Eosinophils Absolute: 0.3 10*3/uL (ref 0.0–0.7)
Eosinophils Relative: 5 %
HEMATOCRIT: 39.5 % (ref 39.0–52.0)
Hemoglobin: 12.7 g/dL — ABNORMAL LOW (ref 13.0–17.0)
Lymphocytes Relative: 31 %
Lymphs Abs: 2.3 10*3/uL (ref 0.7–4.0)
MCH: 27.9 pg (ref 26.0–34.0)
MCHC: 32.2 g/dL (ref 30.0–36.0)
MCV: 86.6 fL (ref 78.0–100.0)
MONO ABS: 0.5 10*3/uL (ref 0.1–1.0)
Monocytes Relative: 7 %
NEUTROS ABS: 4.2 10*3/uL (ref 1.7–7.7)
NEUTROS PCT: 57 %
Platelets: 244 10*3/uL (ref 150–400)
RBC: 4.56 MIL/uL (ref 4.22–5.81)
RDW: 13.6 % (ref 11.5–15.5)
WBC: 7.3 10*3/uL (ref 4.0–10.5)

## 2017-06-07 LAB — COMPREHENSIVE METABOLIC PANEL
ALBUMIN: 2.9 g/dL — AB (ref 3.5–5.0)
ALT: 33 U/L (ref 17–63)
AST: 19 U/L (ref 15–41)
Alkaline Phosphatase: 219 U/L — ABNORMAL HIGH (ref 38–126)
Anion gap: 8 (ref 5–15)
BUN: 14 mg/dL (ref 6–20)
CHLORIDE: 106 mmol/L (ref 101–111)
CO2: 24 mmol/L (ref 22–32)
Calcium: 8.9 mg/dL (ref 8.9–10.3)
Creatinine, Ser: 0.81 mg/dL (ref 0.61–1.24)
GFR calc Af Amer: >60 mL/min (ref >60)
GFR calc non Af Amer: >60 mL/min (ref >60)
GLUCOSE: 102 mg/dL — AB (ref 65–99)
POTASSIUM: 3.9 mmol/L (ref 3.5–5.1)
Sodium: 138 mmol/L (ref 135–145)
TOTAL PROTEIN: 6.4 g/dL — AB (ref 6.5–8.1)
Total Bilirubin: 0.4 mg/dL (ref 0.3–1.2)

## 2017-06-07 MED ORDER — DIVALPROEX SODIUM 250 MG PO DR TAB
250.0000 mg | DELAYED_RELEASE_TABLET | Freq: Three times a day (TID) | ORAL | Status: DC
  Administered 2017-06-07 – 2017-06-08 (×2): 250 mg via ORAL
  Filled 2017-06-07 (×2): qty 1

## 2017-06-07 MED ORDER — QUETIAPINE FUMARATE 50 MG PO TABS
50.0000 mg | ORAL_TABLET | Freq: Every day | ORAL | Status: DC
  Administered 2017-06-07 – 2017-07-01 (×25): 50 mg via ORAL
  Filled 2017-06-07 (×25): qty 1

## 2017-06-07 NOTE — Progress Notes (Signed)
Recreational Therapy Session Note  Patient Details  Name: Patrick [name] MRN: [id number] Date of Birth: 20-Aug-1965 Today's Date: 06/07/2017  TR eval deferred as pt is not yet appropriate for TR services.  Will continue to monitor through team for future participation. Kenrick Pore 06/07/2017, 8:54 AM

## 2017-06-07 NOTE — Progress Notes (Signed)
Due to safety concerns for patient and his inability to comprehend simple tasks and instructions as well as his impulsivity to get OOB unassisted, and confusion;  the re-hab team has corrallated a plan which includes the use of an enclosure bed for this patient.  Verbal order obtained for bed.  Patient able to remove posey restraint without difficulty.  Hand mitts utilized without success.
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
## 2017-06-07 NOTE — Progress Notes (Signed)
Patient information reviewed and entered into eRehab system by Kimaria Struthers, RN, CRRN, PPS Coordinator.  Information including medical coding and functional independence measure will be reviewed and updated through discharge.    

## 2017-06-07 NOTE — Progress Notes (Signed)
Patient with orthostatic episode with therapy--unresponsive for about 20 seconds per report. Likely vasovagal as reported to have low BP when put back in back. On exam was was finishing lunch--was alert and awake without reports of pain or dizziness.  Back to baseline.

## 2017-06-07 NOTE — Progress Notes (Signed)
Occupational Therapy Session Note  Patient Details  Name: Patrick [name] MRN: [id number] Date of Birth: March 13, 1966  Today's Date: 06/07/2017 OT Individual Time: 1400-1450 OT Individual Time Calculation (min): 50 min   Skilled Therapeutic Interventions/Progress Updates:    Pt seen for OT session focusing on orientation and task initiation. Pt sitting up in bed upon arrival with RN and PA present. Cleared to complete therapy as pt able following events of previous session. Assisted RN with having pt take pain medicine as pt not oriented x4 and not trusting nurse or medications. He was able to self feed items from lunch tray mod I.  Attempted to reorient pt throughout session, pt unable to immediately recall orientation facts. Intermittently he would respond he was "in a nursing home", however, when asked again during session would say "[place] friend's backyard" or [place] etc. He stated city was "[place]" and "[place]". Pt would alternate from rage to tearfulness throughout session, talking and praying to self and God throughout, difficulty to redirect.  Attempted to have pt initiate grooming tasks, however, despite total Hand over hand assist and tactile cues for initiation. He attempted to come sitting EOB, therapist released restraint and pt sat EOB, however, cont with actions noted above. Pt would fall asleep immediately, requiring multimodal cues to re-awake and prevent anterior fall forward from EOB. With tactile cues, pt returned to supine, restraint reapplied. Pt asleep as therapist exited room, missed 10 min of skilled session.     Therapy Documentation Precautions:  Precautions Precautions: Fall Precaution Comments: TBI/ do not push on center of forehead / depressed skull fx  Required Braces or Orthoses: Sling(R UE when OOB) Restrictions Weight Bearing Restrictions: Yes RUE Weight Bearing: Non weight bearing RLE Weight Bearing: Weight bearing as  tolerated Pain: Pain Assessment Pain Assessment: No/ denies pain  See Function Navigator for Current Functional Status.   Therapy/Group: Individual Therapy  Lajuanna Pompa L 06/07/2017, 3:02 PM

## 2017-06-07 NOTE — Progress Notes (Signed)
Sister and fiance have spoken with dtr Herbert SetaHeather who is reportedly not coming in tonight. Recommended that [name] speak with the MD and social worker regarding pt care and plan of care. AMA is not recommended and rehab staff will need time to coordinate care after discharge. The patient is not ready to leave the facility at this time. Fiance' wants seroquel stopped as "he doesn't sleep at night after taking this". Fiance' also noted that police have requested a copy of the patient's chart and she is requesting a copy of the chart to provide to the police. Reviewed the process for obtaining a copy of the medical record and AMA policy with Clydie BraunKaren (fiance') however she is not able to sign patient out only the oldest child which we are told is Research scientist (physical sciences)[name]. Beverlee NimsDoretta the sister has spoken with both Herbert SetaHeather and Clydie BraunKaren and told them that the patient is not ready nor are they able to provide the care the patient will need after leaving the hospital. Both Clydie BraunKaren and [place] left after staff placed the patient back into the enclosure bed.  Pamelia HoitSharp, Trevon Strothers B

## 2017-06-07 NOTE — Progress Notes (Signed)
Called to room.  Patient was in bathroom with therapists, sitting on floor.  He was lowered to floor by therapists after complaining of dizziness.  Color was gray at the time.  Stated he was "dizzy" and "didn't feel well".  Assisted to wheelchair, and then to bed.

## 2017-06-07 NOTE — Progress Notes (Signed)
Fiance in to visit patient.  She was very upset once she saw the enclosure, stating she did not want him in a "cage".  She called his sister and his daughter, who I spoke with on the phone.  Using a number of expletives, she made the statement that she was going to come within the hour and "take him out of that cage and out of this 'f-ing' facility and take him somewhere else."  She stated she wanted to sign him out AMA.  The charge nurse spoke with the fiance in the room at length and also with the daughter on the phone, who hung up on her and on me.  She made it clear that all services would be null and void should she wish to sign the patient out AMA.  Even though the patient had calmed after being in the enclosure as opposed to having a posey belt on, the parties were adamant re:  having him removed.  The patient's sister arrived and is now conversing with his fiance.  His daughter has yet to appear.
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
## 2017-06-07 NOTE — Progress Notes (Signed)
Pt awakened slowly from nap and asked "give me a minute to wake up". Once up in chair, hyperverbous and having scattered thoughts, internal distractions, reporting " who do you want me to kill, I;ll kill myself, just need to die followed immediately by "im going to throw up, Im sick" "crying; what do I need to do, I do not know what to do", "thank you, I love you", What did you do with her, when asked who he was looking for reported "look at me I cannot see, where is my sister?" why did you all not let her know where I was?" distracted pt and settled in to chair. Decreased distractions and cued to feed self 100% of dinner. Tylenol given for pain "all over" and reconnected condom cath after pt pulled off and was incont."

## 2017-06-08 ENCOUNTER — Inpatient Hospital Stay (HOSPITAL_COMMUNITY): Payer: No Typology Code available for payment source

## 2017-06-08 ENCOUNTER — Inpatient Hospital Stay (HOSPITAL_COMMUNITY): Payer: No Typology Code available for payment source | Admitting: Speech Pathology

## 2017-06-08 ENCOUNTER — Inpatient Hospital Stay (HOSPITAL_COMMUNITY): Payer: No Typology Code available for payment source | Admitting: Physical Therapy

## 2017-06-08 ENCOUNTER — Inpatient Hospital Stay (HOSPITAL_COMMUNITY): Payer: Self-pay | Admitting: Occupational Therapy

## 2017-06-08 DIAGNOSIS — M79609 Pain in unspecified limb: Secondary | ICD-10-CM

## 2017-06-08 MED ORDER — DIVALPROEX SODIUM 125 MG PO CSDR
250.0000 mg | DELAYED_RELEASE_CAPSULE | Freq: Three times a day (TID) | ORAL | Status: DC
  Administered 2017-06-08 – 2017-06-12 (×12): 250 mg via ORAL
  Filled 2017-06-08 (×13): qty 2
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
## 2017-06-08 NOTE — Progress Notes (Signed)
LE venous duplex prelim: negative for DVT. Hades Mathew Eunice, RDMS, RVT  

## 2017-06-08 NOTE — Progress Notes (Signed)
Notified by OT of dizziness, "going to pass out" reported by pt. BP checked when sitting and standing with noted orthostatic changes noted. Marissa NestlePam Love PAC notified of event and new orders received. TTEDs placed and pt taken to toilet. Pt had successful bowel movement continent on toilet and voided. Assisted back to chair. tol procedure well. Pamelia HoitSharp, Ashwin Tibbs B

## 2017-06-08 NOTE — Care Management (Signed)
[facility]patient Rehabilitation [facility] Individual Statement of Services  Patient Name:  Patrick [name]  Date:  06/08/2017  Welcome to the [facility]patient [facility].  Our goal is to provide you with an individualized program based on your diagnosis and situation, designed to meet your specific needs.  With this comprehensive rehabilitation program, you will be expected to participate in at least 3 hours of rehabilitation therapies Monday-Friday, with modified therapy programming on the weekends.  Your rehabilitation program will include the following services:  Physical Therapy (PT), Occupational Therapy (OT), Speech Therapy (ST), 24 hour per day rehabilitation nursing, Therapeutic Recreaction (TR), Neuropsychology, Case Management (Social Worker), Rehabilitation Medicine, Nutrition Services and Pharmacy Services  Weekly team conferences will be held on Tuesdays to discuss your progress.  Your Social Worker will talk with you frequently to get your input and to update you on team discussions.  Team conferences with you and your family in attendance may also be held.  Expected length of stay: 4 weeks  Overall anticipated outcome: supervision  Depending on your progress and recovery, your program may change. Your Social Worker will coordinate services and will keep you informed of any changes. Your Social Worker's name and contact numbers are listed  below.  The following services may also be recommended but are not provided by the Inpatient [facility]:   Driving Evaluations  Home Health Rehabiltiation Services  Outpatient Rehabilitation Services  Vocational Rehabilitation   Arrangements will be made to provide these services after discharge if needed.  Arrangements include referral to agencies that provide these services.  Your insurance has been verified to be:  None Your primary doctor is:  [facility]  Pertinent information will be shared with your doctor and your insurance  company.  Social Worker:  [facility]ace [place], [place] [phone] or ([phone]) [phone]   Information discussed with and copy given to patient by: Amada JupiterHOYLE, Jazzmin Newbold, 06/08/2017, 11:42 AM

## 2017-06-08 NOTE — Progress Notes (Signed)
Occupational Therapy Session Note  Patient Details  Name: Patrick [name] MRN: [id number] Date of Birth: [birth year]  Today's Date: 06/08/2017 OT Individual Time: [id number] and [id number] OT Individual Time Calculation (min): 60 min and 46 min  Short Term Goals: Week 1:  OT Short Term Goal 1 (Week 1): Pt will initiate one self care task within 5 minutes with max multimodal cues. OT Short Term Goal 2 (Week 1): Pt will attend to self care task in controlled environment for 60 seconds.  OT Short Term Goal 3 (Week 1): Pt will transfer onto toilet with min hand held assistance to decrease level of self care.   Skilled Therapeutic Interventions/Progress Updates:    Pt greeted via SLP handoff in w/c. Pt perseverating on sexual activity/killing God and an unspecified man. Required max cues to redirect. He reported needing to void bladder, completed stand pivot<toilet and urinated in standing with Mod A. Max A for mgt of brief. He then returned to sitting and completed handwashing at sink with HOH assist. Max cues for sequencing required. Afterwards worked on initiation/attention with having pt don pants. Pt continuing to perseverate on sex and verbalizing "I love you" to therapist. Again, max cues for redirection to task. Provided him with 30 minutes to self organize, with pt unable to initiate stand to elevate pants over hips or transfer back to bed. Pt was escorted to RN station and left with safety belt donned and blanket in lap.   2nd Session 1:1 tx (46 min) Pt greeted at [employer] station. Visibly upset, shaking slightly from crying. Once he was escorted to room, pt continuing to cry, verbalizing "It's not fair! Don't let them get me!" Pt adamant he was dying tomorrow and going to hell. Oriented pt to time, place, and situation. Provided calming cues and therapeutic listening. Once he was calmer, he agreed to eat his dessert and peaches. Pt requiring HOH assist initially and then was able to bring food  to mouth while interchanging UE use. Cues provided for taking small bites. Continued to work on sequencing and attention with oral care completion w/c level. HOH for application of toothpaste onto toothbrush, with pt able to brush teeth himself using Rt hand. Pt requiring encouragement and support during all tasks due to emotional lability. At end of tx he was escorted to RN station with safety belt applied.   Therapy Documentation Precautions:  Precautions Precautions: Fall Precaution Comments: TBI/ do not push on center of forehead / depressed skull fx  Required Braces or Orthoses: Sling(RUE) Restrictions Weight Bearing Restrictions: Yes RUE Weight Bearing: Non weight bearing RLE Weight Bearing: Weight bearing as tolerated Pain: No s/s pain during tx  Pain Assessment Pain Assessment: 0-10 Pain Score: 0-No pain ADL:      See Function Navigator for Current Functional Status.  Therapy/Group: Individual Therapy  Patrick [name] A [name] 06/08/2017, 12:29 PM

## 2017-06-08 NOTE — IPOC Note (Signed)
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, init (HCC)     Functional Problem List: Nursing Bladder, Bowel, Endurance, Medication Management, Safety, Pain  PT Balance, Behavior, Endurance, Motor, Perception, Safety  OT Balance, Safety, Behavior, Cognition, Endurance, Motor, Pain, Perception  SLP Cognition  TR         Basic ADL's: OT Grooming, Bathing, Dressing, Toileting     Advanced  ADL's: OT       Transfers: PT Bed Mobility, Bed to Chair, Car, Furniture, Civil Service fast streamerloor  OT Toilet, Research scientist (life sciences)Tub/Shower     Locomotion: PT Ambulation, Stairs     Additional Impairments: OT None  SLP Social Cognition   Social Interaction, Problem Solving, Memory, Attention, Awareness  TR      Anticipated Outcomes Item Anticipated Outcome  Self Feeding Supervision  Swallowing  Min A   Basic self-care  Marketing executiveupervision  Toileting  Supervision   Bathroom Transfers Supervision  Bowel/Bladder  manage with mod I assist  Transfers  supervision with LRAD  Locomotion  supervision with LRAD  Communication     Cognition  Mod A  Pain  Pain at or below level 5  Safety/Judgment  maintain safety with min assist   Therapy Plan: PT Intensity: Minimum of 1-2 x/day ,45 to 90 minutes PT Frequency: 5 out of 7 days PT Duration Estimated Length of Stay: 4 weeks OT Intensity: Minimum of 1-2 x/day, 45 to 90 minutes OT Frequency: 5 out of 7 days OT Duration/Estimated Length of Stay: 25-28 days SLP Intensity: Minumum of 1-2 x/day, 30 to 90 minutes SLP Frequency: 3 to 5 out of 7 days SLP Duration/Estimated Length of Stay: 4 weeks     Team Interventions: Nursing Interventions Bowel Management, Pain Management, Patient/Family Education, Bladder Management, Medication Management,  Cognitive Remediation/Compensation, Discharge Planning, Psychosocial Support  PT interventions Ambulation/gait training, Community reintegration, DME/adaptive equipment instruction, Neuromuscular re-education, Psychosocial support, Stair training, UE/LE Strength taining/ROM, Wheelchair propulsion/positioning, Warden/rangerBalance/vestibular training, Discharge planning, Pain management, Therapeutic Activities, Skin care/wound management, UE/LE Coordination activities, Visual/perceptual remediation/compensation, Therapeutic Exercise, Patient/family education, Splinting/orthotics, Functional mobility training, Disease management/prevention, Cognitive remediation/compensation  OT Interventions Balance/vestibular training, Discharge planning, Pain management, Self Care/advanced ADL retraining, Therapeutic Activities, UE/LE Coordination activities, Cognitive remediation/compensation, Functional mobility training, Patient/family education, Therapeutic Exercise, UE/LE Strength taining/ROM, Psychosocial support, DME/adaptive equipment instruction, Community reintegration  SLP Interventions Cognitive remediation/compensation, Environmental controls, Internal/external aids, Therapeutic Activities, Patient/family education, Functional tasks, Cueing hierarchy  TR Interventions    SW/CM Interventions Psychosocial Support, Discharge Planning, Patient/Family Education   Barriers to Discharge MD  Medical stability and Behavior  Nursing Lack of/limited family support    PT Decreased caregiver support, Lack of/limited family support, Behavior unsure of home environment  OT Decreased caregiver support, Incontinence, Lack of/limited family support, Weight bearing restrictions, Behavior no family present on day of evaluation to obtain information  SLP Decreased caregiver support, Behavior    SW       Team Discharge Planning: Destination: PT-Home ,OT- Home , SLP-Home Projected Follow-up: PT-24 hour supervision/assistance, Home  health PT, OT-  24 hour supervision/assistance, SLP-Home Health SLP, 24 hour supervision/assistance, Skilled Nursing facility Projected Equipment Needs: PT-To be determined, OT- To be determined, SLP-None recommended by SLP Equipment Details: PT- , OT-  Patient/family involved in discharge planning: PT- Patient unable/family or caregiver not available,  OT-Patient unable/family or caregiver not available,  SLP-Patient unable/family or caregive not available  MD ELOS: 25-28 days Medical Rehab Prognosis:  Good Assessment: The patient has been admitted for CIR therapies with the diagnosis of TBI with polytrauma.  Patient was substantial behavioral issues and history of polysubstance abuse. The team will be addressing functional mobility, strength, stamina, balance, safety, adaptive techniques and equipment, self-care, bowel and bladder mgt, patient and caregiver education, neuromuscular reeducation, vestibular treatments, cognitive and visual spatial awareness, community reentry, behavioral management. Goals have been set at supervision to occasional min assist for self-care and ADLs, supervision for basic mobility and transfers and min to mod assist for cognition.    Ranelle Oyster, MD, FAAPMR      See Team Conference Notes for weekly updates to the plan of care

## 2017-06-08 NOTE — Progress Notes (Signed)
Physical Therapy Session Note  Patient Details  Name: Patrick [name] Dean [name] MRN: [id number] Date of Birth: November 15, 1965  Today's Date: 06/08/2017 PT Individual Time: [id number] PT Individual Time Calculation (min): 54 min   Short Term Goals: Week 1:  PT Short Term Goal 1 (Week 1): Pt will ambulate 75 ft with LRAD & min assist. PT Short Term Goal 2 (Week 1): Pt will be oriented to self without assistance. PT Short Term Goal 3 (Week 1): Pt will initiate stair training. PT Short Term Goal 4 (Week 1): Pt will perform car transfer with min assist and LRAD.  Skilled Therapeutic Interventions/Progress Updates:  Pt received in enclosure bed. Session focused on initiation of task and transfers. Pt preservative and hyper-conversive throughout session, reporting "I'm gonna die. Don't let me die." and wishing to speak to "[place]". Pt unable to initiate purposeful activity requiring total assist for supine>sit with mod assist and mod assist for sit>stand. Pt able to stand x 2 trials and reporting significant dizziness, "I'm gonna pass out". Orthostatic vitals were assessed and therapist and RN donned B thigh-high ted hose total assist. Pt appearing to need to void and transferred w/c<>toilet with min assist +2 due to inability to initiate movement. Pt with continent BM on toilet and required assistance for clothing management & peri hygiene. At end of session pt left sitting in w/c at nurses station with QRB donned.   Pt inconsistently unable to recall how many children he has, nor family members names.    Therapy Documentation Precautions:  Precautions Precautions: Fall Precaution Comments: TBI/ do not push on center of forehead / depressed skull fx  Required Braces or Orthoses: Sling(RUE) Restrictions Weight Bearing Restrictions: Yes RUE Weight Bearing: Non weight bearing RLE Weight Bearing: Weight bearing as tolerated  Vital Signs: Sitting: BP = 97/73 mmHg (RUE), HR = 100 bpm Standing: BP = 91/61  mmHg (RUE)  Pain: Pt grimaced in pain when therapist touched RUE, when donning BLE ted hose and touching B hands with pt reporting his hands "are on fire". Pt also reporting his head hurt. RN in room & aware.  See Function Navigator for Current Functional Status.   Therapy/Group: Individual Therapy  Sandi MariscalVictoria M Calleen Alvis 06/08/2017, 3:28 PM

## 2017-06-08 NOTE — Progress Notes (Signed)
Social Work Patient ID: Patrick [name], male   DOB: [birth year], 52 y.o.   MRN: [id number]   Able to speak with patient's daughter, Patrick [name], this morning to complete psychosocial assessment.  We also discussed the events from last evening and the reasoning behind the need for the enclosure bed.  Daughter very pleasant with me and reports "now that I know what  purpose is for the bed I am fine with it."  We were able to talk about her father's TBI and post discharge care needs with daughter seeming very committed to providing primary care for patient on discharge.  Will continue to follow for support and discharge planning.  Patrick Dombek, LCSW

## 2017-06-08 NOTE — Progress Notes (Signed)
Speech Language Pathology Daily Session Note  Patient Details  Name: Patrick [name] [name] MRN: [id number] Date of Birth: 1965/10/29  Today's Date: 06/08/2017 SLP Individual Time: 0900-1000 SLP Individual Time Calculation (min): 60 min  Short Term Goals: Week 1: SLP Short Term Goal 1 (Week 1): Patient will demonstrate sustained attention to a task for 60 seconds with Max A verbal cues for redirection.  SLP Short Term Goal 2 (Week 1): Patient will initiate a functional task with Max A multimodal cues in 25% of opportunities  SLP Short Term Goal 3 (Week 1): Patient will orient to person with Max A multimodal cues.    Skilled Therapeutic Interventions: Skilled treatment session focused on cognitive and dysphagia goals. Upon arrival, patient was upright in the wheelchair with the NT consuming his breakfast meal of regular textures with thin liquids. Patient consumed meal without overt s/s of aspiration but essentially needed hand over hand assist for self-feeding 2/2 decreased focused attention to task.  Patient oriented to self today but continues to demonstrate language of confusion with confabulation with perseveration on Jesus, the devil, wanting to "f people up," and sexually inappropriate comments despite Max redirection. Patient handed off to OT. Continue with current plan of care.      Function:  Eating Eating   Modified Consistency Diet: No Eating Assist Level: Helper feeds patient;Hand over hand assist;Supervision or verbal cues;Set up assist for   Eating Set Up Assist For: Opening containers       Cognition Comprehension Comprehension assist level: Understands basic 25 - 49% of the time/ requires cueing 50 - 75% of the time  Expression   Expression assist level: Expresses basic 25 - 49% of the time/requires cueing 50 - 75% of the time. Uses single words/gestures.  Social Interaction Social Interaction assist level: Interacts appropriately less than 25% of the time. May be  withdrawn or combative.  Problem Solving Problem solving assist level: Solves basic less than 25% of the time - needs direction nearly all the time or does not effectively solve problems and may need a restraint for safety  Memory Memory assist level: Recognizes or recalls less than 25% of the time/requires cueing greater than 75% of the time    Pain No reports of pain   Therapy/Group: Individual Therapy  Olie Scaffidi 06/08/2017, 3:50 PM

## 2017-06-09 ENCOUNTER — Inpatient Hospital Stay (HOSPITAL_COMMUNITY): Payer: Self-pay

## 2017-06-09 ENCOUNTER — Inpatient Hospital Stay (HOSPITAL_COMMUNITY): Payer: Self-pay | Admitting: Occupational Therapy

## 2017-06-09 ENCOUNTER — Inpatient Hospital Stay (HOSPITAL_COMMUNITY): Payer: No Typology Code available for payment source | Admitting: Speech Pathology

## 2017-06-09 DIAGNOSIS — S062X9A Diffuse traumatic brain injury with loss of consciousness of unspecified duration, initial encounter: Secondary | ICD-10-CM

## 2017-06-09 DIAGNOSIS — R451 Restlessness and agitation: Secondary | ICD-10-CM

## 2017-06-09 DIAGNOSIS — R4586 Emotional lability: Secondary | ICD-10-CM

## 2017-06-09 DIAGNOSIS — F1910 Other psychoactive substance abuse, uncomplicated: Secondary | ICD-10-CM

## 2017-06-09 NOTE — Plan of Care (Signed)
  [facility] Description Description: See Patient Education module for eduction specifics Max assist. 06/09/2017 0505 - Not Progressing by Martina SinnerMurray, Kilee Hedding A, RN RH SAFETY RH STG ADHERE TO SAFETY PRECAUTIONS W/ASSISTANCE/DEVICE Description STG Adhere to Safety Precautions With  Cues/supervsion Assistance/Device.  Max assist-poor Engineer, building servicessafety awareness. 06/09/2017 0505 - Not Progressing by Martina SinnerMurray, Marianna Cid A, RN RH KNOWLEDGE DEFICIT BRAIN INJURY RH STG INCREASE KNOWLEDGE OF SELF CARE AFTER BRAIN INJURY Description Pt will be able to direct caregivers on needs and caregivers will be able to provide care using cues/handouts and resources independently  06/09/2017 0505 - Not Progressing by Martina SinnerMurray, Mallorey Odonell A, RN

## 2017-06-09 NOTE — Progress Notes (Signed)
Slept good from 2230-0500. Tearful at HS. Inappropriate conversation. Cognitive deficits. Incontinent of urine x 2. No safety awareness, continues to require Enclosure bed. [name] & O to self only. Attempted for 10 minutes to get patient to stand for ortho vitals, refused. Patrick [name], Patrick [name]

## 2017-06-09 NOTE — Progress Notes (Addendum)
Occupational Therapy Session Note  Patient Details  Name: Van ClinesDoyle Dean [name] MRN: [id number] Date of Birth: August 30, 1965  Today's Date: 06/09/2017 OT Individual Time: [id number] and [id number] OT Individual Time Calculation (min): 74 min and 36 min  Short Term Goals: Week 1:  OT Short Term Goal 1 (Week 1): Pt will initiate one self care task within 5 minutes with max multimodal cues. OT Short Term Goal 2 (Week 1): Pt will attend to self care task in controlled environment for 60 seconds.  OT Short Term Goal 3 (Week 1): Pt will transfer onto toilet with min hand held assistance to decrease level of self care.   Skilled Therapeutic Interventions/Progress Updates:    Pt greeted supine in bed, asleep, agitated that he was woken. Max encouragement to get OOB to eat breakfast. Min guard supine<sit and Mod A for stand step transfer to w/c. He was left in care of RN with Rt arm sling donned at session exit.  Throughout session, pt requiring max cues for attention, orientation, sequencing, and initiation. Pt continues to exhibit language of confusion, confabulation, sexually inappropriate language, and fluctuating emotional states. He was resistant to OT attempts to guide functional movements and therefore significantly extra time was required for transfer.   2nd Session 1:1 tx (36 min) Pt greeted at [employer] station. Per staff report, pt is a Psychologist, clinicalguitar player. Retrieved therapy guitar and encouraged him to play. Worked on sustained attention and awareness with pt strumming guitar. Pt unable to incorporate Rt hand due to pain. Pt reporting that he liked [address]Johnny Cash, Jeral FruitKris Kristofferson, and [employer] Controlsom Petty. Was visibly concentrating when asked to name a few songs. Recalled "Sunday morning" of song Sunday Morning Coming Down. [place] SpanielSaid [place]. At times, pt exhibited sexually inappropriate behaviors with guitar and required max cues to redirect. Physical assist necessary to keep pt from hitting  forehead with neck of guitar x3. At session exit pt remained at RN station with safety belt fastened.    Therapy Documentation Precautions:  Precautions Precautions: Fall Precaution Comments: TBI/ do not push on center of forehead / depressed skull fx  Required Braces or Orthoses: Sling(RUE) Restrictions Weight Bearing Restrictions: Yes RUE Weight Bearing: Non weight bearing RLE Weight Bearing: Weight bearing as tolerated Pain: No c/o pain during tx    ADL:   :   See Function Navigator for Current Functional Status.   Therapy/Group: Individual Therapy  Dennie Moltz A Violet Cart 06/09/2017, 12:28 PM

## 2017-06-09 NOTE — Progress Notes (Signed)
[facility] PHYSICAL MEDICINE & REHABILITATION     PROGRESS NOTE    Subjective/Complaints: Patient seen lying in bed this morning. No reported issues overnight. Patient indicates that he slept well overnight. Mood remained labile.  ROS: Ltd. due to cognitive/behavioral   Objective: Vital Signs: Blood pressure 108/72, pulse 85, temperature 98.1 F (36.7 C), temperature source Oral, resp. rate 20, height 6' (1.829 m), SpO2 99 %. No results found. Recent Labs    06/07/17 0947  WBC 7.3  HGB 12.7*  HCT 39.5  PLT 244   Recent Labs    06/07/17 0947  NA 138  K 3.9  CL 106  GLUCOSE 102*  BUN 14  CREATININE 0.81  CALCIUM 8.9   CBG (last 3)  No results for input(s): GLUCAP in the last 72 hours.  Wt Readings from Last 3 Encounters:  06/02/17 69.8 kg (153 lb 14.1 oz)    Physical Exam:  Constitutional: Tearful.  NAD. Vital signs reviewed. HENT: Depressed areas mid forehead-unchanged  Eyes: Keeps eyes closed  Cardiovascular: RRR. No JVD   Respiratory: CTA Bilaterally. Normal effort  GI: Bowel sounds are normal. He exhibits no distension.  Musculoskeletal: He exhibits no edema or tenderness in all extremities.   Neurological: He is alert.   inconsistently following commands, moving all fours Skin: Warm and dry. Intact. Psychiatric: Tearful.  Assessment/Plan: 1.  Visual and cognitive deficits as well as balance problems secondary to polytrauma and TBI which require 3+ hours per day of interdisciplinary therapy in a comprehensive inpatient rehab setting. Physiatrist is providing close team supervision and 24 hour management of active medical problems listed below. Physiatrist and rehab team continue to assess barriers to discharge/monitor patient progress toward functional and medical goals.  Function:  Bathing Bathing position   Position: Other (comment)(sitting in recliner chair)  Bathing parts   Body parts bathed by helper: Right arm, Left arm, Chest, Abdomen, Front  perineal area, Buttocks, Right upper leg, Left upper leg, Right lower leg, Left lower leg, Back  Bathing assist Assist Level: (total A)      Upper Body Dressing/Undressing Upper body dressing   What is the patient wearing?: Hospital gown                Upper body assist Assist Level: Touching or steadying assistance(Pt > 75%)      Lower Body Dressing/Undressing Lower body dressing   What is the patient wearing?: Pants       Pants- Performed by helper: Thread/unthread right pants leg, Thread/unthread left pants leg   Non-skid slipper socks- Performed by helper: Don/doff right sock, Don/doff left sock                  Lower body assist        Toileting Toileting Toileting activity did not occur: No continent bowel/bladder event Toileting steps completed by patient: Adjust clothing prior to toileting Toileting steps completed by helper: Adjust clothing after toileting    Toileting assist     Transfers Chair/bed transfer Chair/bed transfer activity did not occur: Safety/medical concerns Chair/bed transfer method: Stand pivot Chair/bed transfer assist level: 2 helpers(min assist +2)       Locomotion Ambulation     Max distance: 30 ft Assist level: Touching or steadying assistance (Pt > 75%)   Wheelchair Wheelchair activity did not occur: Safety/medical concerns        Cognition Comprehension Comprehension assist level: Understands basic 25 - 49% of the time/ requires cueing 50 - 75% of  the time  Expression Expression assist level: Expresses basic 25 - 49% of the time/requires cueing 50 - 75% of the time. Uses single words/gestures.  Social Interaction Social Interaction assist level: Interacts appropriately less than 25% of the time. May be withdrawn or combative.  Problem Solving Problem solving assist level: Solves basic less than 25% of the time - needs direction nearly all the time or does not effectively solve problems and may need a restraint for  safety  Memory Memory assist level: Recognizes or recalls less than 25% of the time/requires cueing greater than 75% of the time   Medical Problem List and Plan: 1.  Visual deficits, cognitive deficits with inappropriate behaviors, balance deficits affecting ability to carry out ADL tasks and mobility secondary to polytrauma with TBI.   Continue CIR   Family will need education regarding brain injury and recovery 2.  DVT Prophylaxis/Anticoagulation: Pharmaceutical: Lovenox   Vascular study negative for DVT 3. Pain Management: Hold oxycodone to improve daytime arousal.  Use tramadol for breakthrough pain or tylenol prn depending of pain intensity. 4. Mood: Team to provide ego support to help manage anxiety. LCSW to follow for evaluation and support when appropriate.   5. Neuropsych: This patient is not capable of making decisions on his ownbehalf. 6. Skin/Wound Care: Pressure relief measures 7. Fluids/Electrolytes/Nutrition: I/Os 8. Mild hyponatremia:    Sodium up to 138 on 06/07/2017, follow-up next week 9.  Leucocytosis:    WBCs 7.3 on 06/07/2017 10. Agitation/emotional dyscontrol: Continue scheduled valproic acid    -Continue Seroquel at night for sleep    -Need to record sleep wake chart   -Cont enclosure bed required for safety 11. H/o polysubstance abuse: Educate on cessation as mentation improves.    LOS (Days) 3 A FACE TO FACE EVALUATION WAS PERFORMED  Quita Mcgrory Karis JubaAnil Arisbeth Purrington, MD 06/09/2017 9:49 AM

## 2017-06-09 NOTE — Progress Notes (Signed)
Speech Language Pathology Daily Session Note  Patient Details  Name: Patrick [name] MRN: [id number] Date of Birth: January 26, 1966  Today's Date: 06/09/2017 SLP Individual Time: [id number] SLP Individual Time Calculation (min): 30 min  Short Term Goals: Week 1: SLP Short Term Goal 1 (Week 1): Patient will demonstrate sustained attention to a task for 60 seconds with Max A verbal cues for redirection.  SLP Short Term Goal 2 (Week 1): Patient will initiate a functional task with Max A multimodal cues in 25% of opportunities  SLP Short Term Goal 3 (Week 1): Patient will orient to person with Max A multimodal cues.   Skilled Therapeutic Interventions: Skilled treatment session focused on cognition goals. SLP received pt asleep in enclosure bed. Pt responsive to his name and able to open eyes and move around in bed. Pt was not responsive to cues to get out of bed d/t confusion over purpose of getting out of bed. Attempts to simple describe need to work with SLP were unsuccessful. Pt left secured in enclosure bed with all needs within reach.      Function:    Cognition Comprehension Comprehension assist level: Understands basic less than 25% of the time/ requires cueing >75% of the time  Expression   Expression assist level: Expresses basic 25 - 49% of the time/requires cueing 50 - 75% of the time. Uses single words/gestures.  Social Interaction Social Interaction assist level: Interacts appropriately less than 25% of the time. May be withdrawn or combative.  Problem Solving Problem solving assist level: Solves basic less than 25% of the time - needs direction nearly all the time or does not effectively solve problems and may need a restraint for safety  Memory Memory assist level: Recognizes or recalls less than 25% of the time/requires cueing greater than 75% of the time    Pain    Therapy/Group: Individual Therapy  [name] 06/09/2017, 10:54 AM

## 2017-06-09 NOTE — Progress Notes (Signed)
Physical Therapy Session Note  Patient Details  Name: Patrick [name] MRN: [id number] Date of Birth: 04/16/1966  Today's Date: 06/09/2017 PT Individual Time: 1302-1400 PT Individual Time Calculation (min): 58 min   Short Term Goals: Week 1:  PT Short Term Goal 1 (Week 1): Pt will ambulate 75 ft with LRAD & min assist. PT Short Term Goal 2 (Week 1): Pt will be oriented to self without assistance. PT Short Term Goal 3 (Week 1): Pt will initiate stair training. PT Short Term Goal 4 (Week 1): Pt will perform car transfer with min assist and LRAD.  Skilled Therapeutic Interventions/Progress Updates:    Pt seated in w/c with family upon PT arrival, agreeable to therapy tx and denies pain at this time. Pt unable to name family members in the room (daughter, Herbert SetaHeather). Pt oriented to self but disoriented to place and situation. Pt performed sit<>stand with min assist and max encouragement, vitals stable with BP 124/90. Pt ambulated x 60 ft with mod assist and L arm over therapists shoulder, pt reporting "I feel like I am going to fall" and therapist assisted to w/c. Vitals continue to be stable. Pt agreeable to go to the gym, [facility] total assist. Pt continued to repeat throughout the session "I am dancing with the devil," "I am going to die here," and "I know I will be in jail after this." Pt unable to orient to situation and becoming tearful throughout session, therapist provided emotional support and reoriented pt to situation. Pt reported "I'm going to pass out" and "I am going to vomit," vitals monitored and stable, transported back to room. RN made aware of situation. Pt attempted to get back back into bed secondary to not feeling well however after multiple attempts to stand, pt unwilling and wanted to stay in chair despite max encouragement from therapist and daughter. Pt left seated in w/c with QRB in place, in care of family and RN present.   Therapy  Documentation Precautions:  Precautions Precautions: Fall Precaution Comments: TBI/ do not push on center of forehead / depressed skull fx  Required Braces or Orthoses: Sling(RUE) Restrictions Weight Bearing Restrictions: Yes RUE Weight Bearing: Non weight bearing RLE Weight Bearing: Weight bearing as tolerated   See Function Navigator for Current Functional Status.   Therapy/Group: Individual Therapy  Cresenciano GenreEmily Patrick Schagen, PT, DPT 06/09/2017, 1:00 PM

## 2017-06-10 ENCOUNTER — Inpatient Hospital Stay (HOSPITAL_COMMUNITY): Payer: Self-pay | Admitting: Occupational Therapy

## 2017-06-10 ENCOUNTER — Inpatient Hospital Stay (HOSPITAL_COMMUNITY): Payer: Self-pay

## 2017-06-10 NOTE — Progress Notes (Signed)
Physical Therapy Session Note  Patient Details  Name: Patrick [name] Dean [name] MRN: [id number] Date of Birth: [birth year]  Today's Date: 06/10/2017 PT Individual Time: 1400-1456 PT Individual Time Calculation (min): 56 min   Short Term Goals: Week 1:  PT Short Term Goal 1 (Week 1): Pt will ambulate 75 ft with LRAD & min assist. PT Short Term Goal 2 (Week 1): Pt will be oriented to self without assistance. PT Short Term Goal 3 (Week 1): Pt will initiate stair training. PT Short Term Goal 4 (Week 1): Pt will perform car transfer with min assist and LRAD.  Skilled Therapeutic Interventions/Progress Updates:    Pt received from nursing station in w/c and transported to the gym. Pt reports pain in R arm but does not rate. Pt reported light headedness at the start of session so therapist transported pt back to room to don thigh high teds, total assist. Pt able to attend to task of donning socks, lifting LEs to aid therapist and making comments about the socks. Pt transported back to the gym in w/c total assist. Pt reported that he likes to play Rummy, therapist provided cards and asked pt to find cards as named, pt able to correctly identity 3 cards but then quickly loses interest and begins to confabulate about things he sees in the gym such as a bug and a hawk that is watching him. Pt agreeable to transfer from w/c>mat to look out the window, sit<>stand min assist and 5 ft ambulatory transfer with mod assist. Pt seated edge of mat continues to confabulate about wanting to jump off the building and demons. Therapist provided max encouragement for pt to get back into w/c, pt performed stand pivot transfer with min assist and increased time. Pt transported back to nursing station and left seated in w/c with QRB in place.    Therapy Documentation Precautions:  Precautions Precautions: Fall Precaution Comments: TBI/ do not push on center of forehead / depressed skull fx  Required Braces or Orthoses:  Sling(RUE) Restrictions Weight Bearing Restrictions: Yes RUE Weight Bearing: Non weight bearing RLE Weight Bearing: Weight bearing as tolerated   See Function Navigator for Current Functional Status.   Therapy/Group: Individual Therapy  Cresenciano GenreEmily Patrick Schagen, PT, DPT 06/10/2017, 7:58 AM

## 2017-06-10 NOTE — Progress Notes (Addendum)
Occupational Therapy Session Note  Patient Details  Name: Patrick [name] MRN: [id number] Date of Birth: January 12, 1966  Today's Date: 06/10/2017 OT Individual Time: 1000-1055 OT Individual Time Calculation (min): 55 min   Short Term Goals: Week 1:  OT Short Term Goal 1 (Week 1): Pt will initiate one self care task within 5 minutes with max multimodal cues. OT Short Term Goal 2 (Week 1): Pt will attend to self care task in controlled environment for 60 seconds.  OT Short Term Goal 3 (Week 1): Pt will transfer onto toilet with min hand held assistance to decrease level of self care.   Skilled Therapeutic Interventions/Progress Updates:    Pt greeted supine in bed, asleep, agitated by OT's attempt to waken. Rock music playing at low volume, with pt strumming invisible guitar and mouthing correct words of a [employer] International[employer]ard Skynard song. Able to accurately name this band. Environmental modifications made to increase alertness with pt still adamant that he would not get OOB. "If I get in that chair I'll kill somebody." Pt transitioning from supine<sit several times, reoriented him to time, place, situation, and role of OT with no carryover after about a minute. Tried to have him engage in self care activities EOB with pt unable to initiate completion of these tasks himself and ultimately required Total A. Very distracted by sounds he hears outside of room. Pt believes he is currently in his hometown [employer] resources [place], [place]) and that the police are after him. Confabulation and language of confusion continue. At end of tx pt was repositioned for comfort and secured in enclosure bed. Turned music back on for relaxation/behavioral mgt.   Therapy Documentation Precautions:  Precautions Precautions: Fall Precaution Comments: TBI/ do not push on center of forehead / depressed skull fx  Required Braces or Orthoses: Sling(RUE) Restrictions Weight Bearing Restrictions: Yes RUE Weight Bearing: Non weight bearing RLE  Weight Bearing: Weight bearing as tolerated :    See Function Navigator for Current Functional Status.   Therapy/Group: Individual Therapy  Killian Schwer A Dawnielle Christiana 06/10/2017, 12:27 PM

## 2017-06-10 NOTE — Progress Notes (Signed)
[place] PHYSICAL MEDICINE & REHABILITATION     PROGRESS NOTE    Subjective/Complaints: Patient seen lying in bed this morning. No reported issues overnight. He is flat this morning.  ROS: Limited due to cognitive/behavioral   Objective: Vital Signs: Blood pressure 108/71, pulse 82, temperature 98.2 F (36.8 C), temperature source Oral, resp. rate 18, height 6' (1.829 m), SpO2 99 %. No results found. Recent Labs    06/07/17 0947  WBC 7.3  HGB 12.7*  HCT 39.5  PLT 244   Recent Labs    06/07/17 0947  NA 138  K 3.9  CL 106  GLUCOSE 102*  BUN 14  CREATININE 0.81  CALCIUM 8.9   CBG (last 3)  No results for input(s): GLUCAP in the last 72 hours.  Wt Readings from Last 3 Encounters:  06/02/17 69.8 kg (153 lb 14.1 oz)    Physical Exam:  Constitutional: NAD. Vital signs reviewed. HENT: Depressed areas mid forehead-unchanged  Eyes: Keeps eyes closed  Cardiovascular: RRR. No JVD   Respiratory: CTA Bilaterally. Normal effort  GI: Bowel sounds are normal. He exhibits no distension.  Musculoskeletal: He exhibits no edema or tenderness in all extremities.   Neurological: He is alert.  Inconsistently following commands, moving all fours Skin: Warm and dry. Intact. Psychiatric: Flat.  Assessment/Plan: 1.  Visual and cognitive deficits as well as balance problems secondary to polytrauma and TBI which require 3+ hours per day of interdisciplinary therapy in a comprehensive inpatient rehab setting. Physiatrist is providing close team supervision and 24 hour management of active medical problems listed below. Physiatrist and rehab team continue to assess barriers to discharge/monitor patient progress toward functional and medical goals.  Function:  Bathing Bathing position   Position: Other (comment)(sitting in recliner chair)  Bathing parts   Body parts bathed by helper: Right arm, Left arm, Chest, Abdomen, Front perineal area, Buttocks, Right upper leg, Left upper  leg, Right lower leg, Left lower leg, Back  Bathing assist Assist Level: (total A)      Upper Body Dressing/Undressing Upper body dressing   What is the patient wearing?: Hospital gown                Upper body assist Assist Level: Touching or steadying assistance(Pt > 75%)      Lower Body Dressing/Undressing Lower body dressing   What is the patient wearing?: Non-skid slipper socks       Pants- Performed by helper: Thread/unthread right pants leg, Thread/unthread left pants leg   Non-skid slipper socks- Performed by helper: Don/doff right sock, Don/doff left sock                  Lower body assist        Toileting Toileting Toileting activity did not occur: No continent bowel/bladder event Toileting steps completed by patient: Adjust clothing prior to toileting Toileting steps completed by helper: Adjust clothing prior to toileting, Performs perineal hygiene, Adjust clothing after toileting Toileting Assistive Devices: Grab bar or rail  Toileting assist Assist level: Supervision or verbal cues, Touching or steadying assistance (Pt.75%)   Transfers Chair/bed transfer Chair/bed transfer activity did not occur: Safety/medical concerns Chair/bed transfer method: Stand pivot Chair/bed transfer assist level: 2 helpers(min assist +2)       Locomotion Ambulation     Max distance: 60 ft Assist level: Moderate assist (Pt 50 - 74%)   Wheelchair Wheelchair activity did not occur: Safety/medical concerns        Cognition Comprehension Comprehension assist  level: Understands basic less than 25% of the time/ requires cueing >75% of the time  Expression Expression assist level: Expresses basic 25 - 49% of the time/requires cueing 50 - 75% of the time. Uses single words/gestures.  Social Interaction Social Interaction assist level: Interacts appropriately less than 25% of the time. May be withdrawn or combative.  Problem Solving Problem solving assist level: Solves  basic less than 25% of the time - needs direction nearly all the time or does not effectively solve problems and may need a restraint for safety  Memory Memory assist level: Recognizes or recalls less than 25% of the time/requires cueing greater than 75% of the time   Medical Problem List and Plan: 1.  Visual deficits, cognitive deficits with inappropriate behaviors, balance deficits affecting ability to carry out ADL tasks and mobility secondary to polytrauma with TBI.   Continue CIR   Family will need education regarding brain injury and recovery 2.  DVT Prophylaxis/Anticoagulation: Pharmaceutical: Lovenox   Vascular study negative for DVT 3. Pain Management: Hold oxycodone to improve daytime arousal.  Use tramadol for breakthrough pain or tylenol prn depending of pain intensity. 4. Mood: Team to provide ego support to help manage anxiety. LCSW to follow for evaluation and support when appropriate.   5. Neuropsych: This patient is not capable of making decisions on his ownbehalf. 6. Skin/Wound Care: Pressure relief measures 7. Fluids/Electrolytes/Nutrition: I/Os 8. Mild hyponatremia:    Sodium up to 138 on 06/07/2017, follow-up this week 9.  Leucocytosis: Resolved   WBCs 7.3 on 06/07/2017 10. Agitation/emotional dyscontrol: Continue scheduled valproic acid    -Continue Seroquel at night for sleep    -Need to record sleep wake chart   -Continue enclosure bed required for safety 11. H/o polysubstance abuse: Educate on cessation as mentation improves.    LOS (Days) 4 A FACE TO FACE EVALUATION WAS PERFORMED  Dijuan Sleeth Karis Juba, MD 06/10/2017 7:52 AM

## 2017-06-11 ENCOUNTER — Inpatient Hospital Stay (HOSPITAL_COMMUNITY): Payer: Self-pay | Admitting: Occupational Therapy

## 2017-06-11 ENCOUNTER — Inpatient Hospital Stay (HOSPITAL_COMMUNITY): Payer: Self-pay | Admitting: Physical Therapy

## 2017-06-11 ENCOUNTER — Inpatient Hospital Stay (HOSPITAL_COMMUNITY): Payer: No Typology Code available for payment source | Admitting: Occupational Therapy

## 2017-06-11 ENCOUNTER — Inpatient Hospital Stay (HOSPITAL_COMMUNITY): Payer: No Typology Code available for payment source | Admitting: Speech Pathology

## 2017-06-11 NOTE — Plan of Care (Signed)
  Not Progressing Consults RH BRAIN INJURY PATIENT EDUCATION Description Description: See Patient Education module for eduction specifics Zero carry over of info. 06/11/2017 0117 - Not Progressing by Martina SinnerMurray, Caycee Wanat A, RN RH BOWEL ELIMINATION RH STG MANAGE BOWEL WITH ASSISTANCE Description STG Manage Bowel with min Assistance.  Incontinent requiring total assistance. 06/11/2017 0117 - Not Progressing by Martina SinnerMurray, Veasna Santibanez A, RN RH BLADDER ELIMINATION RH STG MANAGE BLADDER WITH ASSISTANCE Description STG Manage Bladder With  Min Assistance  Total assist-incontinent 06/11/2017 0117 - Not Progressing by Martina SinnerMurray, Aaliyha Mumford A, RN RH SAFETY RH STG ADHERE TO SAFETY PRECAUTIONS W/ASSISTANCE/DEVICE Description STG Adhere to Safety Precautions With  Cues/supervsion Assistance/Device.  Total assist-enclosure bed and 1:1 when OOB. 06/11/2017 0117 - Not Progressing by Martina SinnerMurray, Shermaine Brigham A, RN

## 2017-06-11 NOTE — Progress Notes (Signed)
Speech Language Pathology Daily Session Note  Patient Details  Name: Van ClinesDoyle Dean [name] MRN: [id number] Date of Birth: [birth year]  Today's Date: 06/11/2017 SLP Individual Time: 1030-1120 SLP Individual Time Calculation (min): 50 min  Missed Time: 10 minutes, patient ill   Short Term Goals: Week 1: SLP Short Term Goal 1 (Week 1): Patient will demonstrate sustained attention to a task for 60 seconds with Max A verbal cues for redirection.  SLP Short Term Goal 2 (Week 1): Patient will initiate a functional task with Max A multimodal cues in 25% of opportunities  SLP Short Term Goal 3 (Week 1): Patient will orient to person with Max A multimodal cues.   Skilled Therapeutic Interventions: Skilled treatment session focused on cognitive goals. SLP facilitated session by providing Max-Total A verbal cues for patient to follow basic 1-step commands within functional and familiar tasks as well as structured tasks. Patient unable to initiate basic commands 2/2 decreased focused attention. Patient less verbose and tangential this session but continues to demonstrate language of confusion with confabulation and intermittent verbal frustration. Patient maintained topic of conversation for ~3 turns X 1. Towards end of session, patient reporting dizziness and nausea. RN aware and patient transferred back to enclosure bed. Continue with current plan of care.      Function:  Eating Eating   Modified Consistency Diet: No Eating Assist Level: More than reasonable amount of time;Set up assist for;Supervision or verbal cues   Eating Set Up Assist For: Opening containers;Cutting food       Cognition Comprehension Comprehension assist level: Understands basic less than 25% of the time/ requires cueing >75% of the time  Expression   Expression assist level: Expresses basis less than 25% of the time/requires cueing >75% of the time.  Social Interaction Social Interaction assist level: Interacts  appropriately less than 25% of the time. May be withdrawn or combative.  Problem Solving Problem solving assist level: Solves basic less than 25% of the time - needs direction nearly all the time or does not effectively solve problems and may need a restraint for safety  Memory Memory assist level: Recognizes or recalls less than 25% of the time/requires cueing greater than 75% of the time    Pain Pain Assessment Pain Assessment: Faces Faces Pain Scale: Hurts little more Pain Location: leg Pain Orientation: Right Pain Descriptors / Indicators: Aching;Burning Pain Onset: On-going Patients Stated Pain Goal: 2 Pain Intervention(s): Medication (See eMAR);Repositioned  Therapy/Group: Individual Therapy  Shandee Jergens 06/11/2017, 12:17 PM

## 2017-06-11 NOTE — Progress Notes (Signed)
Orthopedic Tech Progress Note Patient Details:  Van ClinesDoyle Dean [name] March 30, 1966 [id number]  Ortho Devices Type of Ortho Device: Abdominal binder Ortho Device/Splint Location: abdomen Ortho Device/Splint Interventions: Ordered    as ordered by PA Tula NakayamaPamela L ove   Abdallah Hern 06/11/2017, 11:38 AM

## 2017-06-11 NOTE — Progress Notes (Signed)
[place] PHYSICAL MEDICINE & REHABILITATION     PROGRESS NOTE    Subjective/Complaints: Pt in bed. Just waking up. No problems overnight. Incontinent  ROS: pt denies nausea, vomiting, diarrhea, cough, shortness of breath or chest pain    Objective: Vital Signs: Blood pressure 107/70, pulse 75, temperature 98.3 F (36.8 C), temperature source Oral, resp. rate 18, height 6' (1.829 m), SpO2 99 %. No results found. No results for input(s): WBC, HGB, HCT, PLT in the last 72 hours. No results for input(s): NA, K, CL, GLUCOSE, BUN, CREATININE, CALCIUM in the last 72 hours.  Invalid input(s): CO CBG (last 3)  No results for input(s): GLUCAP in the last 72 hours.  Wt Readings from Last 3 Encounters:  06/02/17 69.8 kg (153 lb 14.1 oz)    Physical Exam:  Constitutional: NAD. Vital signs reviewed. HENT: Depressed areas mid forehead-stable  Eyes: Keeps eyes closed  Cardiovascular: RRR without murmur. No JVD   Respiratory: CTA Bilaterally without wheezes or rales. Normal effort   GI: Bowel sounds are normal. He exhibits no distension.  Musculoskeletal: He exhibits no edema or tenderness in all extremities.   Neurological: He is alert. Limited attention.  Skin: Warm and dry. Intact. Psychiatric: Flat.  Assessment/Plan: 1.  Visual and cognitive deficits as well as balance problems secondary to polytrauma and TBI which require 3+ hours per day of interdisciplinary therapy in a comprehensive inpatient rehab setting. Physiatrist is providing close team supervision and 24 hour management of active medical problems listed below. Physiatrist and rehab team continue to assess barriers to discharge/monitor patient progress toward functional and medical goals.  Function:  Bathing Bathing position   Position: Other (comment)(sitting in recliner chair)  Bathing parts   Body parts bathed by helper: Right arm, Left arm, Chest, Abdomen, Front perineal area, Buttocks, Right upper leg, Left upper  leg, Right lower leg, Left lower leg, Back  Bathing assist Assist Level: (total A)      Upper Body Dressing/Undressing Upper body dressing   What is the patient wearing?: Hospital gown                Upper body assist Assist Level: Touching or steadying assistance(Pt > 75%)      Lower Body Dressing/Undressing Lower body dressing   What is the patient wearing?: Non-skid slipper socks       Pants- Performed by helper: Thread/unthread right pants leg, Thread/unthread left pants leg   Non-skid slipper socks- Performed by helper: Don/doff right sock, Don/doff left sock                  Lower body assist        Toileting Toileting Toileting activity did not occur: No continent bowel/bladder event Toileting steps completed by patient: Adjust clothing prior to toileting Toileting steps completed by helper: (P) Adjust clothing prior to toileting, Performs perineal hygiene, Adjust clothing after toileting Toileting Assistive Devices: (P) Grab bar or rail  Toileting assist Assist level: (P) Touching or steadying assistance (Pt.75%)   Transfers Chair/bed transfer Chair/bed transfer activity did not occur: Safety/medical concerns Chair/bed transfer method: Stand pivot Chair/bed transfer assist level: Touching or steadying assistance (Pt > 75%) Chair/bed transfer assistive device: Armrests     Locomotion Ambulation     Max distance: 5 ft Assist level: Moderate assist (Pt 50 - 74%)   Wheelchair Wheelchair activity did not occur: Safety/medical concerns        Cognition Comprehension Comprehension assist level: Understands basic less than 25% of  the time/ requires cueing >75% of the time  Expression Expression assist level: Expresses basic 25 - 49% of the time/requires cueing 50 - 75% of the time. Uses single words/gestures.  Social Interaction Social Interaction assist level: Interacts appropriately less than 25% of the time. May be withdrawn or combative.  Problem  Solving Problem solving assist level: Solves basic less than 25% of the time - needs direction nearly all the time or does not effectively solve problems and may need a restraint for safety  Memory Memory assist level: Recognizes or recalls less than 25% of the time/requires cueing greater than 75% of the time   Medical Problem List and Plan: 1.  Visual deficits, cognitive deficits with inappropriate behaviors, balance deficits affecting ability to carry out ADL tasks and mobility secondary to polytrauma with TBI.   Continue CIR   Family education regarding brain injury and recovery 2.  DVT Prophylaxis/Anticoagulation: Pharmaceutical: Lovenox   Vascular study negative for DVT 3. Pain Management: Hold oxycodone to improve daytime arousal.  Use tramadol for breakthrough pain or tylenol prn depending of pain intensity. 4. Mood: Team to provide ego support to help manage anxiety. LCSW to follow for evaluation and support when appropriate.   5. Neuropsych: This patient is not capable of making decisions on his ownbehalf. 6. Skin/Wound Care: Pressure relief measures 7. Fluids/Electrolytes/Nutrition: I/Os 8. Mild hyponatremia:    Sodium up to 138 on 06/07/2017, follow-up this week 9.  Leucocytosis: Resolved   WBCs 7.3 on 06/07/2017 10. Agitation/emotional dyscontrol: Continue scheduled valproic acid    -Continue Seroquel at night for sleep    -sleep wake chart   - enclosure bed remains required for safety 11. H/o polysubstance abuse: Educate on cessation as mentation improves.    LOS (Days) 5 A FACE TO FACE EVALUATION WAS PERFORMED  Ranelle OysterSWARTZ,Kedra Mcglade T, MD 06/11/2017 9:20 AM

## 2017-06-11 NOTE — Progress Notes (Signed)
Occupational Therapy Session Note  Patient Details  Name: Patrick [name] MRN: [id number] Date of Birth: 1965-08-05  Today's Date: 06/11/2017 OT Individual Time: 7846-96290830-0930 OT Individual Time Calculation (min): 60 min    Short Term Goals: Week 1:  OT Short Term Goal 1 (Week 1): Pt will initiate one self care task within 5 minutes with max multimodal cues. OT Short Term Goal 2 (Week 1): Pt will attend to self care task in controlled environment for 60 seconds.  OT Short Term Goal 3 (Week 1): Pt will transfer onto toilet with min hand held assistance to decrease level of self care.   Skilled Therapeutic Interventions/Progress Updates:    Pt received in enclosure bed sleeping but awoke easily.  Pt was not sexually inappropriate this session, but did repeat numerous times "I just want to die" "please shoot me" " I miss my mama" "My Cathren Harshmama is going to be so mad" "they want to hurt me". Pt very focused on his RUE pain.  Pt was redirectable with calm guidance but could only sustain attention for a few seconds at a time.  With coaxing and a great deal of encouragement pt did sit to EOB with mod A.  Pt sat at EOB for self care. Despite numerous attempts and guiding to have pt self bathe,[pt was not able to focus.  Pt was cooperative to have therapist bathe him, don new briefs, TEDS and clothing with total A.  It took 4 attempts to have pt stand to pull pants over hips.  Pt would agree to do it and then become distracted within seconds.  He finally stood with min A and maintained static stand with S.  He then sat back to bed.  With RNs arrival, pt transferred to w/c with stand pivot with min A.  Quick release belt and chair alarm on. Pt set up for breakfast with RN providing supervision.  Therapy Documentation Precautions:  Precautions Precautions: Fall Precaution Comments: TBI/ do not push on center of forehead / depressed skull fx  Required Braces or Orthoses: Sling(RUE) Restrictions Weight  Bearing Restrictions: Yes RUE Weight Bearing: Non weight bearing RLE Weight Bearing: Weight bearing as tolerated    Vital Signs: Therapy Vitals Temp: 98.3 F (36.8 C) Temp Source: Oral Pulse Rate: 75 Resp: 18 BP: 107/70 Patient Position (if appropriate): Lying Oxygen Therapy SpO2: 99 % O2 Device: Not Delivered Pain: Pain Assessment Pain Assessment: Faces Faces Pain Scale: Hurts whole lot Pain Type: Acute pain;Surgical pain Pain Location: Arm Pain Orientation: Right Pain Descriptors / Indicators: Aching Pain Frequency: Intermittent Pain Onset: On-going Pain Intervention(s): RN made aware Multiple Pain Sites: No ADL:   See Function Navigator for Current Functional Status.   Therapy/Group: Individual Therapy  Amaziah Ghosh 06/11/2017, 9:46 AM

## 2017-06-11 NOTE — Progress Notes (Signed)
Physical Therapy Session Note  Patient Details  Name: Patrick [name] MRN: [id number] Date of Birth: 1965/07/12  Today's Date: 06/11/2017 PT Individual Time: 1430-1500 PT Individual Time Calculation (min): 30 min   Short Term Goals: Week 1:  PT Short Term Goal 1 (Week 1): Pt will ambulate 75 ft with LRAD & min assist. PT Short Term Goal 2 (Week 1): Pt will be oriented to self without assistance. PT Short Term Goal 3 (Week 1): Pt will initiate stair training. PT Short Term Goal 4 (Week 1): Pt will perform car transfer with min assist and LRAD.  Skilled Therapeutic Interventions/Progress Updates: Pt received supine in veil bed, alert and agreeable to participation. Music utilized throughout session for increased motivation, utilized to encourage OOB activity. Pt sat to EOB after increased time and multimodal cueing, various methods of encouraging and facilitation task. Pt sat EOB with S for balance, engaging in nonsensical conversation with therapists, occasionally answering questions accurately, able to correctly name artists playing music, however highly distracted internally/externally by fixation on "calling Jesus and talking to him", seeing people/cars outside and trying to figure out who they were, and became perseverative on wanting to engage in sexual intercourse. Able to redirect pt only briefly. Cued pt to transfer to w/c several times in various ways, moved w/c to pt's L side with mild increase in awareness of chair compared to chair on R side, however then distracted again and unable to initiate transfer to chair. Returned pt to bed with min guard and facilitation to initiate task. Remained in enclosure bed, all needs in reach.      Therapy Documentation Precautions:  Precautions Precautions: Fall Precaution Comments: TBI/ do not push on center of forehead / depressed skull fx  Required Braces or Orthoses: Sling(RUE) Restrictions Weight Bearing Restrictions: Yes RUE Weight  Bearing: Non weight bearing RLE Weight Bearing: Weight bearing as tolerated General: PT Amount of Missed Time (min): 30 Minutes PT Missed Treatment Reason: Other (Comment)(unable to redirect to participate after prolonged period of time) Pain: Pain Assessment Faces Pain Scale: Hurts little more   See Function Navigator for Current Functional Status.   Therapy/Group: Individual Therapy  Vista Lawmanlizabeth J Tygielski 06/11/2017, 3:49 PM

## 2017-06-11 NOTE — Progress Notes (Signed)
At 0000, large incontinent BM. While cleaning, Patient became agitated and attempted  to get out of enclosure bed. Calmed down after turning country music on. Patrick MartinezMurray, Patrick [name] A

## 2017-06-11 NOTE — Progress Notes (Signed)
Occupational Therapy Session Note  Patient Details  Name: Van ClinesDoyle Dean [name] MRN: [id number] Date of Birth: June 14, 1965  Today's Date: 06/11/2017 OT Individual Time: [id number] OT Individual Time Calculation (min): 60 min    Short Term Goals: Week 1:  OT Short Term Goal 1 (Week 1): Pt will initiate one self care task within 5 minutes with max multimodal cues. OT Short Term Goal 2 (Week 1): Pt will attend to self care task in controlled environment for 60 seconds.  OT Short Term Goal 3 (Week 1): Pt will transfer onto toilet with min hand held assistance to decrease level of self care.   Skilled Therapeutic Interventions/Progress Updates:    Pt greeted sitting at edge of enclosure bed with RN present, started eating lunch. Tx focus on sustained attention, awareness, and self feeding capabilities. He used Rt hand to manage utensils and bring food to mouth. He reported having no pain in R UE. Able to identify 1/4 food items on plate, and consumed all food with significantly extra time. Read 4 food items from food tray receipt accurately: "It's what I ate."Oriented to place and situation this session. Still exhibits language of confusion, fluctuating emotional states, confabulation, and sexually inappropriate comments/behaviors. He required max cues for redirection to self feeding tasks when hyperverbal. Would not pull up shirt to allow therapist to don abdominal binder. Became sexually inappropriate with education on BP mgt and encouragement. He returned to supine with supervision and was left with all needs in secured enclosure bed. Played Johnny Cash playlist at low volume in room computer to promote relaxation/calmness during rest.   Therapy Documentation Precautions:  Precautions Precautions: Fall Precaution Comments: TBI/ do not push on center of forehead / depressed skull fx  Required Braces or Orthoses: Sling(RUE) Restrictions Weight Bearing Restrictions: Yes RUE Weight Bearing: Non  weight bearing RLE Weight Bearing: Weight bearing as tolerated Pain: Pain Assessment Faces Pain Scale: Hurts little more ADL:      See Function Navigator for Current Functional Status.  Therapy/Group: Individual Therapy  Angelette Ganus A [place] 06/11/2017, 3:38 PM

## 2017-06-12 ENCOUNTER — Inpatient Hospital Stay (HOSPITAL_COMMUNITY): Payer: Self-pay | Admitting: Physical Therapy

## 2017-06-12 ENCOUNTER — Inpatient Hospital Stay (HOSPITAL_COMMUNITY): Payer: No Typology Code available for payment source | Admitting: Occupational Therapy

## 2017-06-12 ENCOUNTER — Inpatient Hospital Stay (HOSPITAL_COMMUNITY): Payer: No Typology Code available for payment source | Admitting: Speech Pathology

## 2017-06-12 MED ORDER — METHYLPHENIDATE HCL 5 MG PO TABS
5.0000 mg | ORAL_TABLET | Freq: Two times a day (BID) | ORAL | Status: DC
  Administered 2017-06-12 – 2017-06-14 (×4): 5 mg via ORAL
  Filled 2017-06-12 (×4): qty 1

## 2017-06-12 MED ORDER — DIVALPROEX SODIUM 125 MG PO CSDR
500.0000 mg | DELAYED_RELEASE_CAPSULE | Freq: Three times a day (TID) | ORAL | Status: DC
  Administered 2017-06-12 – 2017-07-06 (×70): 500 mg via ORAL
  Filled 2017-06-12 (×78): qty 4

## 2017-06-12 MED ORDER — DIVALPROEX SODIUM 500 MG PO DR TAB
500.0000 mg | DELAYED_RELEASE_TABLET | Freq: Three times a day (TID) | ORAL | Status: DC
Start: 2017-06-12 — End: 2017-06-12
  Filled 2017-06-12 (×2): qty 1

## 2017-06-12 NOTE — Progress Notes (Signed)
Patrick [name] PHYSICAL MEDICINE & REHABILITATION     PROGRESS NOTE    Subjective/Complaints: Patient with a fairly uneventful night.  When I entered he stated he was ready to go to bed.  Continues to talk about demons and devils  ROS: Limited due to cognitive/behavioral   Objective: Vital Signs: Blood pressure (!) 90/55, pulse 89, temperature 99 F (37.2 C), temperature source Oral, resp. rate 16, height 6' (1.829 m), SpO2 99 %. No results found. No results for input(s): WBC, HGB, HCT, PLT in the last 72 hours. No results for input(s): NA, K, CL, GLUCOSE, BUN, CREATININE, CALCIUM in the last 72 hours.  Invalid input(s): CO CBG (last 3)  No results for input(s): GLUCAP in the last 72 hours.  Wt Readings from Last 3 Encounters:  06/02/17 69.8 kg (153 lb 14.1 oz)    Physical Exam:  Constitutional: NAD. Vital signs reviewed. HENT: Depressed areas mid forehead-stable  Eyes: Opens eyes and make eye contact with today Cardiovascular: RRR without murmur. No JVD   Respiratory: CTA Bilaterally without wheezes or rales. Normal effort    GI: Bowel sounds are normal. He exhibits no distension.  Musculoskeletal: He exhibits no edema or tenderness in all extremities.   Neurological: Alert but distracted.  Relates and sometimes just coherent speech.  Skin: Warm and dry. Intact. Psychiatric: Restless.  Will quickly come to tears  Assessment/Plan: 1.  Visual and cognitive deficits as well as balance problems secondary to polytrauma and TBI which require 3+ hours per day of interdisciplinary therapy in a comprehensive inpatient rehab setting. Physiatrist is providing close team supervision and 24 hour management of active medical problems listed below. Physiatrist and rehab team continue to assess barriers to discharge/monitor patient progress toward functional and medical goals.  Function:  Bathing Bathing position   Position: Sitting EOB  Bathing parts   Body parts bathed by helper:  Right arm, Left arm, Chest, Abdomen, Front perineal area, Buttocks, Right upper leg, Left upper leg, Right lower leg, Left lower leg, Back  Bathing assist Assist Level: (total A)      Upper Body Dressing/Undressing Upper body dressing   What is the patient wearing?: Pull over shirt/dress       Pull over shirt/dress - Perfomed by helper: Thread/unthread right sleeve, Thread/unthread left sleeve, Put head through opening, Pull shirt over trunk        Upper body assist Assist Level: Touching or steadying assistance(Pt > 75%)      Lower Body Dressing/Undressing Lower body dressing   What is the patient wearing?: Non-skid slipper socks, Pants, Ted Hose       Pants- Performed by helper: Thread/unthread right pants leg, Thread/unthread left pants leg, Pull pants up/down   Non-skid slipper socks- Performed by helper: Don/doff right sock, Don/doff left sock               TED Hose - Performed by helper: Don/doff right TED hose, Don/doff left TED hose  Lower body assist        Toileting Toileting Toileting activity did not occur: No continent bowel/bladder event Toileting steps completed by patient: Adjust clothing prior to toileting Toileting steps completed by helper: Adjust clothing prior to toileting, Performs perineal hygiene, Adjust clothing after toileting Toileting Assistive Devices: Grab bar or rail  Toileting assist Assist level: Touching or steadying assistance (Pt.75%)   Transfers Chair/bed transfer Chair/bed transfer activity did not occur: Safety/medical concerns Chair/bed transfer method: Stand pivot Chair/bed transfer assist level: Touching or steadying assistance (Pt >  75%) Chair/bed transfer assistive device: Armrests     Locomotion Ambulation     Max distance: 5 ft Assist level: Moderate assist (Pt 50 - 74%)   Wheelchair Wheelchair activity did not occur: Safety/medical concerns        Cognition Comprehension Comprehension assist level:  Understands basic less than 25% of the time/ requires cueing >75% of the time  Expression Expression assist level: Expresses basis less than 25% of the time/requires cueing >75% of the time.  Social Interaction Social Interaction assist level: Interacts appropriately less than 25% of the time. May be withdrawn or combative.  Problem Solving Problem solving assist level: Solves basic less than 25% of the time - needs direction nearly all the time or does not effectively solve problems and may need a restraint for safety  Memory Memory assist level: Recognizes or recalls less than 25% of the time/requires cueing greater than 75% of the time   Medical Problem List and Plan: 1.  Visual deficits, cognitive deficits with inappropriate behaviors, balance deficits affecting ability to carry out ADL tasks and mobility secondary to polytrauma with TBI.   Continue CIR   Family education regarding brain injury and recovery   Team conference today 2.  DVT Prophylaxis/Anticoagulation: Pharmaceutical: Lovenox   Vascular study negative for DVT 3. Pain Management: Hold oxycodone to improve daytime arousal.  Use tramadol for breakthrough pain or tylenol prn depending of pain intensity. 4. Mood: Team to provide ego support to help manage anxiety. LCSW to follow for evaluation and support when appropriate.   5. Neuropsych: This patient is not capable of making decisions on his ownbehalf.   -Add Ritalin to see if it helps with attention and focus 6. Skin/Wound Care: Pressure relief measures 7. Fluids/Electrolytes/Nutrition: I/Os 8. Mild hyponatremia:    Sodium up to 138 on 06/07/2017, follow-up Thursday 9.  Leucocytosis: Resolved   WBCs 7.3 on 06/07/2017 10. Agitation/emotional dyscontrol: Continue scheduled valproic acid, increase to 500 mg 3 times daily   -Continue Seroquel at night for sleep    -sleep wake chart   - enclosure bed remains required for safety 11. H/o polysubstance abuse: Educate on cessation  when appropriate.    LOS (Days) 6 A FACE TO FACE EVALUATION WAS PERFORMED  Patrick [name],Patrick Eke T, MD 06/12/2017 8:57 AM

## 2017-06-12 NOTE — Progress Notes (Signed)
Speech Language Pathology Daily Session Note  Patient Details  Name: Patrick [name] MRN: [id number] Date of Birth: [birth year]  Today's Date: 06/12/2017 SLP Individual Time: [id number] SLP Individual Time Calculation (min): 55 min  Short Term Goals: Week 1: SLP Short Term Goal 1 (Week 1): Patient will demonstrate sustained attention to a task for 60 seconds with Max A verbal cues for redirection.  SLP Short Term Goal 2 (Week 1): Patient will initiate a functional task with Max A multimodal cues in 25% of opportunities  SLP Short Term Goal 3 (Week 1): Patient will orient to person with Max A multimodal cues.   Skilled Therapeutic Interventions: Skilled treatment session focused on cognitive goals. SLP facilitated session by providing Max-Total A verbal cues for patient to follow basic 1-step commands within functional and familiar tasks as well as structured tasks due to decreased attention and verbosity. However, patient able to sort colors X 3 with Max A for attention and problem solving. Patient tangential this session and continues to demonstrate language of confusion with confabulation and intermittent verbal frustration with intermittent inappropriate sexual comments. Patient transferred back to enclosure bed at end of session. Continue with current plan of care.      Function:   Cognition Comprehension Comprehension assist level: Understands basic less than 25% of the time/ requires cueing >75% of the time  Expression   Expression assist level: Expresses basis less than 25% of the time/requires cueing >75% of the time.  Social Interaction Social Interaction assist level: Interacts appropriately less than 25% of the time. May be withdrawn or combative.  Problem Solving Problem solving assist level: Solves basic less than 25% of the time - needs direction nearly all the time or does not effectively solve problems and may need a restraint for safety  Memory Memory assist level:  Recognizes or recalls less than 25% of the time/requires cueing greater than 75% of the time    Pain No/Denies Pain   Therapy/Group: Individual Therapy  Donalyn Schneeberger 06/12/2017, 12:46 PM

## 2017-06-12 NOTE — Progress Notes (Signed)
Physical Therapy Session Note  Patient Details  Name: Patrick [name] MRN: [id number] Date of Birth: January 29, 1966  Today's Date: 06/12/2017 PT Individual Time: 6389-3734 AND 1400-1440 PT Individual Time Calculation (min): 57 min AND 40 min  PT Missed Time (min): 20 min (unavailable)   Short Term Goals: Week 1:  PT Short Term Goal 1 (Week 1): Pt will ambulate 75 ft with LRAD & min assist. PT Short Term Goal 2 (Week 1): Pt will be oriented to self without assistance. PT Short Term Goal 3 (Week 1): Pt will initiate stair training. PT Short Term Goal 4 (Week 1): Pt will perform car transfer with min assist and LRAD.  Skilled Therapeutic Interventions/Progress Updates:   Session !:  Pt supine and agreeable to therapy, c/o nausea and RN present providing medication. Total assist to don new brief as previous one had fallen off, transferred to EOB w/ increased time and supervision. Max assist to don ted hose and pants, pt able to lift both LEs appropriately to assist and used LUE to bring pants over hips. Abdominal binder donned and transferred to chair w/ min guard and max cues for encouragement to participate in OOB therapy. Pt verbally perseverative on feeling tired and nauseous, needing to call his mother, and wanting to be physically aggressive towards various people. Able to be redirected w/ max cues, no evidence of physical aggression towards this therapist, no impulsivity noted. Max cues to engage in eating breakfast in w/c w/ full supervision. Pt utilized utensils and cut food w/ LUE w/o cues, occasional verbal cues to chew prior to speaking. Overall, able to attend to task of eating breakfast w/ minimal cues. No clinical signs or symptoms, or c/o, of dizziness this session. Ended session in w/c and in care of speech therapist.   Session 2:  Pt supine and agreeable to therapy, no c/o pain. Transferred to EOB w/ supervision and to w/c via stand pivot transfer w/ min assist to steady .  Total assist w/c transport to gym. Transferred to edge of mat w/ min assist and focused on tolerance to upright/OOB activity and cognitive remediation this session. Performed pipe task in standing w/o UE support for 1-2 minutes, however unable to return to standing despite max cues after seated rest break, finished task in seated. Max-total assist cues to complete task, attend to task, and redirect. Pt perseverating on needing to throw up and tearful about being in hospital. Easily redirected but needing redirection almost constantly. Pt's family arrived at this time and reporting a representative from social security office was present and pt needed to sign papers. Attempted to have pt ambulate back to room, but unable to safely attempt 2/2 verbal perseveration and decreased safety. Total assist w/c transport to room. Ended session in w/c, chair alarm on and in care of family (daughters and significant other). All needs met. Missed 20 min of skilled PT 2/2 being unavailable.   Therapy Documentation Precautions:  Precautions Precautions: Fall Precaution Comments: TBI/ do not push on center of forehead / depressed skull fx  Required Braces or Orthoses: Sling(RUE) Restrictions Weight Bearing Restrictions: Yes RUE Weight Bearing: Non weight bearing RLE Weight Bearing: Weight bearing as tolerated  See Function Navigator for Current Functional Status.   Therapy/Group: Individual Therapy  Iliani Vejar K Arnette 06/12/2017, 12:07 PM

## 2017-06-12 NOTE — Progress Notes (Signed)
   Subjective:    Patient ID: Patrick [name], male    DOB: 1965-08-19, 52 y.o.   MRN: [id number]  Progressing in rehab.  Nasal splint no longer present.  External nose looks fairly straight, small bump on left sidewall superiorly.  Central forehead indentation.  A/P: Nasal fracture, NOE fracture, frontal fracture  Facial injuries are stable.  Can follow-up with me after discharge.      Review of Systems     Objective:   Physical Exam        Assessment & Plan:

## 2017-06-12 NOTE — Plan of Care (Signed)
  Not Progressing RH BLADDER ELIMINATION RH STG MANAGE BLADDER WITH ASSISTANCE Description STG Manage Bladder With  Min Assistance  06/12/2017 1504 - Not Progressing by Alfonso RamusEvans, Khanh Cordner S, RN Note Incontinent  RH SAFETY RH STG ADHERE TO SAFETY PRECAUTIONS W/ASSISTANCE/DEVICE Description STG Adhere to Safety Precautions With  Cues/supervsion Assistance/Device.  06/12/2017 1504 - Not Progressing by Alfonso RamusEvans, Donnah Levert S, RN Note Pt still in restraints

## 2017-06-12 NOTE — Progress Notes (Signed)
Occupational Therapy Note  Patient Details  Name: Patrick [name] MRN: [id number] Date of Birth: 04/26/1966  Today's Date: 06/12/2017 OT Individual Time: [id number] OT Individual Time Calculation (min): 24 min  and Today's Date: 06/12/2017 OT Missed Time: 36 Minutes Missed Time Reason: Patient fatigue  Upon entering the room, pt sleeping soundly in enclosure bed. OT making several attempts to wake pt up with pt opening eyes only to fall back asleep again. Pt did not verbally speak to therapist and unable to participate secondary to lethargy. OT notified RN. Pt missed 36 minutes of skilled OT intervention this session.    Jackquline DenmarkBradsher, Patrick Belizaire P 06/12/2017, 11:42 AM

## 2017-06-13 ENCOUNTER — Inpatient Hospital Stay (HOSPITAL_COMMUNITY): Payer: No Typology Code available for payment source | Admitting: Physical Therapy

## 2017-06-13 ENCOUNTER — Inpatient Hospital Stay (HOSPITAL_COMMUNITY): Payer: No Typology Code available for payment source | Admitting: Speech Pathology

## 2017-06-13 ENCOUNTER — Inpatient Hospital Stay (HOSPITAL_COMMUNITY): Payer: No Typology Code available for payment source | Admitting: Occupational Therapy

## 2017-06-13 LAB — CBC
HCT: 37.8 % — ABNORMAL LOW (ref 39.0–52.0)
Hemoglobin: 12.3 g/dL — ABNORMAL LOW (ref 13.0–17.0)
MCH: 28.5 pg (ref 26.0–34.0)
MCHC: 32.5 g/dL (ref 30.0–36.0)
MCV: 87.7 fL (ref 78.0–100.0)
PLATELETS: 274 10*3/uL (ref 150–400)
RBC: 4.31 MIL/uL (ref 4.22–5.81)
RDW: 13.8 % (ref 11.5–15.5)
WBC: 5.8 10*3/uL (ref 4.0–10.5)

## 2017-06-13 LAB — BASIC METABOLIC PANEL
ANION GAP: 10 (ref 5–15)
BUN: 10 mg/dL (ref 6–20)
CALCIUM: 9 mg/dL (ref 8.9–10.3)
CO2: 24 mmol/L (ref 22–32)
CREATININE: 0.83 mg/dL (ref 0.61–1.24)
Chloride: 108 mmol/L (ref 101–111)
GFR calc Af Amer: >60 mL/min (ref >60)
Glucose, Bld: 112 mg/dL — ABNORMAL HIGH (ref 65–99)
Potassium: 3.8 mmol/L (ref 3.5–5.1)
SODIUM: 142 mmol/L (ref 135–145)

## 2017-06-13 NOTE — Progress Notes (Signed)
Speech Language Pathology Daily Session Notes  Patient Details  Name: Patrick [name] MRN: [id number] Date of Birth: [birth year]  Today's Date: 06/13/2017  Session 1: SLP Individual Time: 4098-11911135-1205 SLP Individual Time Calculation (min): 30 min   Session 2: SLP Individual Time: 1430-1455 SLP Individual Time Calculation (min): 25 min   Short Term Goals: Week 1: SLP Short Term Goal 1 (Week 1): Patient will demonstrate sustained attention to a task for 60 seconds with Max A verbal cues for redirection.  SLP Short Term Goal 2 (Week 1): Patient will initiate a functional task with Max A multimodal cues in 25% of opportunities  SLP Short Term Goal 3 (Week 1): Patient will orient to person with Max A multimodal cues.   Skilled Therapeutic Interventions:  Session 1: Skilled treatment session focused on cognitive goals. Upon arrival, patient was supine in bed and had been incontinent. SLP facilitated session by providing extra time and Max-Total A tactile and verbal cues for problem solving during basic self-care tasks. Patient also required Max A multimodal cues for topic appropriateness throughout session. Patient left upright in recliner with NT present. Continue with current plan of care.   Session 2: Skilled treatment session focused on cognitive goals. SLP facilitated session by initiating visual aids for orientation to place that patient was able to utilize with extra time and Mod A verbal and visual cues. Patient also required total A for orientation to situation, city and intellectual awareness of deficits. Patient left supine in enclosure bed with all needs within reach. Continue with current plan of care.   Function:   Cognition Comprehension Comprehension assist level: Understands basic less than 25% of the time/ requires cueing >75% of the time  Expression   Expression assist level: Expresses basis less than 25% of the time/requires cueing >75% of the time.  Social Interaction  Social Interaction assist level: Interacts appropriately less than 25% of the time. May be withdrawn or combative.  Problem Solving Problem solving assist level: Solves basic less than 25% of the time - needs direction nearly all the time or does not effectively solve problems and may need a restraint for safety  Memory Memory assist level: Recognizes or recalls less than 25% of the time/requires cueing greater than 75% of the time    Pain Patient reports, "he feels like dog crap" but is unable to provide details. RN aware and administered medications   Therapy/Group: Individual Therapy  Camaya Gannett 06/13/2017, 3:18 PM

## 2017-06-13 NOTE — Progress Notes (Signed)
Social Work Patient ID: Patrick [name] [name], male   DOB: [birth year], 52 y.o.   MRN: [id number]   Have spoken with daughter today to review weekly team conference report.  She understands that her father remains significantly cognitively impaired and notes that she attempted to complete paperwork yesterday with him and was unable to do so.  (This was paperwork for disability application.)  Daughter appears to be gaining a somewhat better understanding of the severity of her father's TBI and the behavioral components he is exhibiting as well.  She is aware that team has an estimated LOS of 3-4 weeks.  We will encourage her participation in therapies for education.  Continue to follow.  Deepa Barthel, LCSW

## 2017-06-13 NOTE — Progress Notes (Signed)
Occupational Therapy Session Note  Patient Details  Name: Patrick [name] MRN: [id number] Date of Birth: 08-25-1965  Today's Date: 06/13/2017 OT Individual Time: 4098-11910930-1039 OT Individual Time Calculation (min): 69 min    Short Term Goals: Week 1:  OT Short Term Goal 1 (Week 1): Pt will initiate one self care task within 5 minutes with max multimodal cues. OT Short Term Goal 2 (Week 1): Pt will attend to self care task in controlled environment for 60 seconds.  OT Short Term Goal 3 (Week 1): Pt will transfer onto toilet with min hand held assistance to decrease level of self care.   Skilled Therapeutic Interventions/Progress Updates:    Upon entering the room, pt is sleeping soundly. Pt refused OOB activities and remains in enclosure bed throughout session. Pt completely naked in bed with blankets over him and refusing to don clothing items. Pt required total cues for orientation and pt unable to state his own name this session. Pt falling in and out of sleep this session and required max cues for redirection when verbally and sexually inappropriate. Pt did not make any comments this session in regards to "talking to Jesus" or "Dancing with the Devil". Country music played quietly throughout session as well and pt nodding head to music and singing lyrics to songs. Pt allowing therapist to don B thigh high TED hose and non slip gripper socks. Pt's BP in supine is 90/66. Pt asking for drink and brings cup to mouth to drink without cues. OT attempting urinal with pt but he was unable to void this session. Pt remaining in bed and going back to sleep at end of session. Bed secured and all needs within reach upon exiting the room.   Therapy Documentation Precautions:  Precautions Precautions: Fall Precaution Comments: TBI/ do not push on center of forehead / depressed skull fx  Required Braces or Orthoses: Sling(RUE) Restrictions Weight Bearing Restrictions: Yes RUE Weight Bearing: Non  weight bearing RLE Weight Bearing: Weight bearing as tolerated   See Function Navigator for Current Functional Status.   Therapy/Group: Individual Therapy  Alen BleacherBradsher, Mikyle Sox P 06/13/2017, 11:54 AM
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
## 2017-06-13 NOTE — Progress Notes (Addendum)
Physical Therapy Weekly Progress Note  Patient Details  Name: Patrick [name] MRN: [id number] Date of Birth: 1966/05/27  Beginning of progress report period: June 07, 2017 End of progress report period: June 14, 2017  Today's Date: 06/14/2017 PT Individual Time: 1040-1105  PT Individual Time Calculation (min): 25 min    Patient has met 1 of 4 short term goals.  Pt is making slow progress towards all goals 2/2 significant cognitive deficits. Pt continues to demonstrate impaired intellectual awareness. Pt has been able to ambulate up to 100 ft on one occasion with HHA and completes transfers with min assist overall. Pt would benefit from continued skilled PT treatment to focus on cognitive remediation, functional mobility, and caregiver education.   Patient continues to demonstrate the following deficits muscle weakness, decreased cardiorespiratoy endurance, decreased coordination, decreased visual perceptual skills, decreased initiation, decreased attention, decreased awareness, decreased problem solving, decreased safety awareness, decreased memory and delayed processing, and decreased standing balance, decreased postural control, decreased balance strategies and difficulty maintaining precautions and therefore will continue to benefit from skilled PT intervention to increase functional independence with mobility.  Patient progressing toward long term goals..  Continue plan of care.  PT Short Term Goals Week 1:  PT Short Term Goal 1 (Week 1): Pt will ambulate 75 ft with LRAD & min assist. PT Short Term Goal 1 - Progress (Week 1): Met PT Short Term Goal 2 (Week 1): Pt will be oriented to self without assistance. PT Short Term Goal 2 - Progress (Week 1): Progressing toward goal PT Short Term Goal 3 (Week 1): Pt will initiate stair training. PT Short Term Goal 3 - Progress (Week 1): Not met PT Short Term Goal 4 (Week 1): Pt will perform car transfer with min assist and LRAD. PT  Short Term Goal 4 - Progress (Week 1): Not met Week 2:  PT Short Term Goal 1 (Week 2): Pt will ambulate 75 ft with LRAD & min assist. PT Short Term Goal 2 (Week 2): Pt will initiate stair training. PT Short Term Goal 3 (Week 2): Pt will perform car transfer with min assist and LRAD.  Skilled Therapeutic Interventions/Progress Updates:  Treatment 1: Pt received in enclosure bed with soiled brief 2/2 incontinent void. Pt transferred supine>sitting EOB with supervision and therapist provided max assist for doffing soiled and donning clean brief. Pt unable to attend to task to assist with changing brief. Therapist provided total assist for adjusting ted hose and donning socks 2/2 time management. Therapist also provided max assist for donning clean gown. Pt completed sit>stand transfer with steady assist and ambulated 100 ft to nurses station and back to room with LUE HHA min assist. Pt requires max cuing to attend to task at hand as pt hyperconversive throughout session, continuing to be sexually inappropriate, frequently cursing, and speaking of "dancing with the devil". From w/c level pt washed hands at sink with mod cuing to attend to task but total assist for turning on/off sink, retrieving soap, and using paper towels. At end of session pt left sitting in w/c with QRB donned at a nurses station.  Throughout session pt with behaviors demonstrating RUE pain and extremity repositioned for comfort.  Addendum: HR = 92-97 bpm, SpO2 = 100% after ambulating Pt able to state he was in [place], [place] without therapist orienting him.    Therapy Documentation Precautions:  Precautions Precautions: Fall Precaution Comments: TBI/ do not push on center of forehead / depressed skull fx  Required Braces or Orthoses:  Sling(RUE) Restrictions Weight Bearing Restrictions: Yes RUE Weight Bearing: Non weight bearing RLE Weight Bearing: Weight bearing as tolerated   See Function Navigator for Current Functional  Status.  Therapy/Group: Individual Therapy  Patrick [name] 06/14/2017, 12:22 PM

## 2017-06-13 NOTE — Progress Notes (Signed)
Speech Language Pathology Daily Make-up Session Note  Patient Details  Name: Patrick [name] [name] MRN: [id number] Date of Birth: 04-08-1966  Today's Date: 06/13/2017 SLP Individual Time: 0800-0830 SLP Individual Time Calculation (min): 30 min  Short Term Goals: Week 1: SLP Short Term Goal 1 (Week 1): Patient will demonstrate sustained attention to a task for 60 seconds with Max A verbal cues for redirection.  SLP Short Term Goal 2 (Week 1): Patient will initiate a functional task with Max A multimodal cues in 25% of opportunities  SLP Short Term Goal 3 (Week 1): Patient will orient to person with Max A multimodal cues.   Skilled Therapeutic Interventions: Skilled treatment session focused on cognitive goals. SLP facilitated session by initially providing Max A verbal and tactile cues which faded to Min-Mod A by end of session for initiation of self-feeding. Patient self-fed 100% of meals with mod A verbal cues needed for selective attention to task for ~1-2 minute increments. Patient continues to demonstrate language of confusion that fluctuates between sexually inappropriate, verbal agitation with threats and praise of clinician which requires Max A multimodal cues for redirection.  Patient left upright in enclosure bed with all needs within reach. Continue with current plan of care.      Function:  Eating Eating   Modified Consistency Diet: No Eating Assist Level: More than reasonable amount of time;Set up assist for;Supervision or verbal cues   Eating Set Up Assist For: Opening containers;Cutting food       Cognition Comprehension Comprehension assist level: Understands basic less than 25% of the time/ requires cueing >75% of the time  Expression   Expression assist level: Expresses basis less than 25% of the time/requires cueing >75% of the time.  Social Interaction Social Interaction assist level: Interacts appropriately less than 25% of the time. May be withdrawn or combative.   Problem Solving Problem solving assist level: Solves basic less than 25% of the time - needs direction nearly all the time or does not effectively solve problems and may need a restraint for safety  Memory Memory assist level: Recognizes or recalls less than 25% of the time/requires cueing greater than 75% of the time    Pain No reports of pain   Therapy/Group: Individual Therapy  Shakyia Bosso 06/13/2017, 9:47 AM
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
## 2017-06-13 NOTE — Progress Notes (Signed)
Physical Therapy Session Note  Patient Details  Name: Patrick [name] MRN: [id number] Date of Birth: 01/16/1966  Today's Date: 06/13/2017 PT Individual Time: [id number] and 1530-1558 PT Individual Time Calculation (min): 54 min and 28 min   Short Term Goals: Week 1:  PT Short Term Goal 1 (Week 1): Pt will ambulate 75 ft with LRAD & min assist. PT Short Term Goal 2 (Week 1): Pt will be oriented to self without assistance. PT Short Term Goal 3 (Week 1): Pt will initiate stair training. PT Short Term Goal 4 (Week 1): Pt will perform car transfer with min assist and LRAD.  Skilled Therapeutic Interventions/Progress Updates:  Treatment 1: Pt received in enclosure bed. Pt with behaviors noting pain with RUE movement. Session focused on functional mobility, activity tolerance, cognitive remediation, and transfers. Pt transferred supine>sit with supervision. Pt washed face with set up assist and supervision. Pt completes bed<>w/c and sit<>stand transfers with min assist. Pt ambulated 55 ft + 40 ft with LUE HHA min assist. Pt frequently reporting "I'm going to throw up". Pt utilized dynavision from w/c level with task focusing on sustained attention which pt was able to do for a max of 25 seconds. Pt able to correctly recall birthday and recall location [name] after therapist oriented him. Throughout session pt continued to make inappropriate sexual comments and talk about "going to dance with the devil" and "why don't you just shoot me" with therapist reorienting him each time. Pt does show slight improvement as he is able to follow one step commands 20% of the time but still requires significantly extra time to initiate tasks. At end of session pt returned to enclosure bed & was left with all needs within reach.  During session pt consumed snack cake with supervision.  Pt with difficulty following and maintaining NWB RUE 2/2 cognitive deficits despite education & instruction from  therapist.    Treatment 2: Pt received in enclosure bed. Therapist provided pt with 18x18 w/c for increased positioning and comfort when sitting OOB. Remainder of session focused on functional mobility and sustained attention. Pt transferred to sitting EOB and required max cuing to pull up ted hose but pt unable to execute task 2/2 inability to attend to instructions. Pt with brief off and therapist assisted him with donning new brief max assist. Pt reported need to use restroom and ambulated within room & bathroom with L HHA min assist and had continent void standing at toilet. Pt assisted back to enclosure bed & left with all needs within reach. Throughout session pt perseverating on freckles on his legs being deer ticks, "dancing with the devil", and continues to be sexually inappropriate towards therapist.     Therapy Documentation Precautions:  Precautions Precautions: Fall Precaution Comments: TBI/ do not push on center of forehead / depressed skull fx  Required Braces or Orthoses: Sling(RUE) Restrictions Weight Bearing Restrictions: Yes RUE Weight Bearing: Non weight bearing RLE Weight Bearing: Weight bearing as tolerated   See Function Navigator for Current Functional Status.   Therapy/Group: Individual Therapy  Patrick MariscalVictoria M Jebidiah [name] 06/13/2017, 4:54 PM

## 2017-06-14 ENCOUNTER — Inpatient Hospital Stay (HOSPITAL_COMMUNITY): Payer: No Typology Code available for payment source | Admitting: Occupational Therapy

## 2017-06-14 ENCOUNTER — Inpatient Hospital Stay (HOSPITAL_COMMUNITY): Payer: No Typology Code available for payment source | Admitting: Physical Therapy

## 2017-06-14 ENCOUNTER — Inpatient Hospital Stay (HOSPITAL_COMMUNITY): Payer: No Typology Code available for payment source | Admitting: Speech Pathology

## 2017-06-14 ENCOUNTER — Encounter (HOSPITAL_COMMUNITY): Payer: Self-pay | Admitting: *Deleted

## 2017-06-14 MED ORDER — METHYLPHENIDATE HCL 5 MG PO TABS
10.0000 mg | ORAL_TABLET | Freq: Two times a day (BID) | ORAL | Status: DC
  Administered 2017-06-14 – 2017-06-20 (×12): 10 mg via ORAL
  Filled 2017-06-14 (×12): qty 2
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
## 2017-06-14 NOTE — Progress Notes (Signed)
Occupational Therapy Session Note  Patient Details  Name: Patrick [name] MRN: [id number] Date of Birth: September 03, 1965  Today's Date: 06/14/2017 OT Individual Time: 1400-1500 OT Individual Time Calculation (min): 60 min    Short Term Goals: Week 1:  OT Short Term Goal 1 (Week 1): Pt will initiate one self care task within 5 minutes with max multimodal cues. OT Short Term Goal 2 (Week 1): Pt will attend to self care task in controlled environment for 60 seconds.  OT Short Term Goal 3 (Week 1): Pt will transfer onto toilet with min hand held assistance to decrease level of self care.   Skilled Therapeutic Interventions/Progress Updates:    Pt transitioned easily from OT session without issue. Pt seated in wheelchair with no c/o pain this session. Pt listening to familiar music and singing along as well as reading song lyrics off screen for unfamiliar songs. Pt given old maid cards and shuffled cards, matched 3 sets (choice of 7 cards total), and read the name of each card with max multimodal cues to attend to task. Pt continues to be verbose and making inappropriate sexual comments throughout session. Pt handed off to next therapist at end of session.   Therapy Documentation Precautions:  Precautions Precautions: Fall Precaution Comments: TBI/ do not push on center of forehead / depressed skull fx  Required Braces or Orthoses: Sling(RUE) Restrictions Weight Bearing Restrictions: Yes RUE Weight Bearing: Non weight bearing RLE Weight Bearing: Weight bearing as tolerated General:   Vital Signs: Therapy Vitals Temp: 98.1 F (36.7 C) Temp Source: Oral Pulse Rate: 98 Resp: 19 BP: 110/71 Patient Position (if appropriate): Sitting Oxygen Therapy SpO2: 100 % O2 Device: Not Delivered  See Function Navigator for Current Functional Status.   Therapy/Group: Individual Therapy  Alen BleacherBradsher, [name] P 06/14/2017, 4:20 PM

## 2017-06-14 NOTE — Progress Notes (Signed)
Physical Therapy Session Note  Patient Details  Name: Patrick [name] MRN: [id number] Date of Birth: 29-Jan-1966  Today's Date: 06/14/2017 PT Individual Time: 1500-1530 PT Individual Time Calculation (min): 30 min   Short Term Goals: Week 2:  PT Short Term Goal 1 (Week 2): Pt will ambulate 75 ft with LRAD & min assist. PT Short Term Goal 2 (Week 2): Pt will initiate stair training. PT Short Term Goal 3 (Week 2): Pt will perform car transfer with min assist and LRAD.  Skilled Therapeutic Interventions/Progress Updates: Pt received seated in w/c with handoff from OT; denies pain and agreeable to treatment. Pt engaged in dynavision x2 trials for 2-3 min each trial before becoming restless and irritable. Pt able to visually track and reach to hit lights in all four quadrants; demonstrates fast reaction speed (<2 sec) when focused to task, however overall average reaction speed 4-5 seconds due to being frequently distracted and requiring cues to attend. Instructed pt in retrieving red playing chips from connect four game; requires maxA for attention. Attempted engaging pt in conversation about hobbies; therapist asked if pt enjoyed four wheeling and pt became very upset, reporting he had two friends recently killed in four wheeling accidents. Provided emotional support, and after appropriate amount of time attempted to redirect pt to new task. Pt provided with graham cracker and peanut butter snack, required assist for opening packages after unsuccessful attempts. Remained seated in w/c at nurses station; quick release belt intact and all needs in reach.      Therapy Documentation Precautions:  Precautions Precautions: Fall Precaution Comments: TBI/ do not push on center of forehead / depressed skull fx  Required Braces or Orthoses: Sling(RUE) Restrictions Weight Bearing Restrictions: Yes RUE Weight Bearing: Non weight bearing RLE Weight Bearing: Weight bearing as tolerated   See  Function Navigator for Current Functional Status.   Therapy/Group: Individual Therapy  Vista Lawmanlizabeth J Tygielski 06/14/2017, 4:13 PM

## 2017-06-14 NOTE — Progress Notes (Signed)
[facility] PHYSICAL MEDICINE & REHABILITATION     PROGRESS NOTE    Subjective/Complaints: Seems to have slept a bit better last night.  Resting in his bed comfortably.  [place]  ROS: Limited due to cognitive/behavioral   Objective: Vital Signs: Blood pressure 93/70, pulse 93, temperature 98.6 F (37 C), temperature source Oral, resp. rate 17, height 6' (1.829 m), weight 70 kg (154 lb 5.2 oz), SpO2 99 %. No results found. Recent Labs    06/13/17 0830  WBC 5.8  HGB 12.3*  HCT 37.8*  PLT 274   Recent Labs    06/13/17 0830  NA 142  K 3.8  CL 108  GLUCOSE 112*  BUN 10  CREATININE 0.83  CALCIUM 9.0   CBG (last 3)  No results for input(s): GLUCAP in the last 72 hours.  Wt Readings from Last 3 Encounters:  06/13/17 70 kg (154 lb 5.2 oz)  06/02/17 69.8 kg (153 lb 14.1 oz)    Physical Exam:  Constitutional: NAD. Vital signs reviewed. HENT: Depressed areas mid forehead-stable  Eyes: Eyes open.  No areas of drainage or irritation Cardiovascular: RRR without murmur. No JVD  Respiratory: Normal effort GI: Bowel sounds are normal. He exhibits no distension.  Musculoskeletal: He exhibits no edema or tenderness in all extremities.   Neurological: More alert this morning.  Speech was clear.  Seem to be somewhat focused initially during our conversation but then after a minute or 2 went off path.  He began to demonstrate significant language of confusion.  Skin: Warm and dry. Intact. Psychiatric: Affect perhaps a bit less labile  Assessment/Plan: 1.  Visual and cognitive deficits as well as balance problems secondary to polytrauma and TBI which require 3+ hours per day of interdisciplinary therapy in a comprehensive inpatient rehab setting. Physiatrist is providing close team supervision and 24 hour management of active medical problems listed below. Physiatrist and rehab team continue to assess barriers to discharge/monitor patient progress toward  functional and medical goals.  Function:  Bathing Bathing position   Position: Sitting EOB  Bathing parts   Body parts bathed by helper: Right arm, Left arm, Chest, Abdomen, Front perineal area, Buttocks, Right upper leg, Left upper leg, Right lower leg, Left lower leg, Back  Bathing assist Assist Level: (total A)      Upper Body Dressing/Undressing Upper body dressing   What is the patient wearing?: Pull over shirt/dress       Pull over shirt/dress - Perfomed by helper: Thread/unthread right sleeve, Thread/unthread left sleeve, Put head through opening, Pull shirt over trunk        Upper body assist Assist Level: Touching or steadying assistance(Pt > 75%)      Lower Body Dressing/Undressing Lower body dressing   What is the patient wearing?: Non-skid slipper socks, Pants, Ted Hose       Pants- Performed by helper: Thread/unthread right pants leg, Thread/unthread left pants leg, Pull pants up/down   Non-skid slipper socks- Performed by helper: Don/doff right sock, Don/doff left sock               TED Hose - Performed by helper: Don/doff right TED hose, Don/doff left TED hose  Lower body assist        Toileting Toileting Toileting activity did not occur: No continent bowel/bladder event Toileting steps completed by patient: Adjust clothing prior to toileting Toileting steps completed by helper: Adjust clothing prior to toileting, Performs perineal hygiene, Adjust clothing after toileting  Toileting Assistive Devices: Estate agent Assist level: Touching or steadying assistance (Pt.75%)   Transfers Chair/bed transfer Chair/bed transfer activity did not occur: Safety/medical concerns Chair/bed transfer method: Stand pivot Chair/bed transfer assist level: Touching or steadying assistance (Pt > 75%) Chair/bed transfer assistive device: (HHA)     Locomotion Ambulation     Max distance: 15 ft Assist level: Touching or steadying assistance  (Pt > 75%)   Wheelchair Wheelchair activity did not occur: Safety/medical concerns        Cognition Comprehension Comprehension assist level: Understands basic less than 25% of the time/ requires cueing >75% of the time  Expression Expression assist level: Expresses basis less than 25% of the time/requires cueing >75% of the time.  Social Interaction Social Interaction assist level: Interacts appropriately less than 25% of the time. May be withdrawn or combative.  Problem Solving Problem solving assist level: Solves basic less than 25% of the time - needs direction nearly all the time or does not effectively solve problems and may need a restraint for safety  Memory Memory assist level: Recognizes or recalls less than 25% of the time/requires cueing greater than 75% of the time   Medical Problem List and Plan: 1.  Visual deficits, cognitive deficits with inappropriate behaviors, balance deficits affecting ability to carry out ADL tasks and mobility secondary to polytrauma with TBI.   Continue CIR   Patient with ongoing substantial cognitive deficits 2.  DVT Prophylaxis/Anticoagulation: Pharmaceutical: Lovenox   Vascular study negative for DVT 3. Pain Management: Hold oxycodone to improve daytime arousal.  Use tramadol for breakthrough pain or tylenol prn depending of pain intensity. 4. Mood: Team to provide ego support to continue help manage anxiety. LCSW to follow for evaluation and support when appropriate.   5. Neuropsych: This patient is not capable of making decisions on his ownbehalf.   -Continue Ritalin for attention and focus--increase to 10 mg 6. Skin/Wound Care: Pressure relief measures 7. Fluids/Electrolytes/Nutrition: I/Os 8. Mild hyponatremia:    Sodium up to 142 . 9.  Leucocytosis: Resolved   WBCs decreased to 5.8   10. Agitation/emotional dyscontrol: Continue scheduled valproic acid, increased to 500 mg 3 times daily   -Continue Seroquel at night for sleep    -sleep  wake chart   - enclosure bed remains required for safety 11. H/o polysubstance abuse: Educate on cessation when appropriate.    LOS (Days) 8 A FACE TO FACE EVALUATION WAS PERFORMED  Ranelle Oyster, MD 06/14/2017 9:15 AM

## 2017-06-14 NOTE — Progress Notes (Signed)
Speech Language Pathology Weekly Progress and Session Note  Patient Details  Name: Patrick [name] MRN: [id number] Date of Birth: Oct 16, 1965  Beginning of progress report period: June 07, 2017 End of progress report period: June 14, 2017  Today's Date: 06/14/2017 SLP Individual Time: 0900-1000 SLP Individual Time Calculation (min): 60 min  Short Term Goals: Week 1: SLP Short Term Goal 1 (Week 1): Patient will demonstrate sustained attention to a task for 60 seconds with Max A verbal cues for redirection.  SLP Short Term Goal 1 - Progress (Week 1): Not met SLP Short Term Goal 2 (Week 1): Patient will initiate a functional task with Max A multimodal cues in 25% of opportunities  SLP Short Term Goal 2 - Progress (Week 1): Met SLP Short Term Goal 3 (Week 1): Patient will orient to person with Max A multimodal cues.  SLP Short Term Goal 3 - Progress (Week 1): Met    New Short Term Goals: Week 2: SLP Short Term Goal 1 (Week 2): Patient will demonstrate sustained attention to a task for 60 seconds with Max A verbal cues for redirection.  SLP Short Term Goal 2 (Week 2): Patient will initiate a functional task with Max A multimodal cues in 50% of opportunities  SLP Short Term Goal 3 (Week 2): Patient will orient to person, place and situation with Max A multimodal cues.   Weekly Progress Updates: Patient has made functional and inconsistent gains and has met 2 of 3 STG's this reporting period. Currently, patient demonstrates behaviors consistent with a Rancho Level IV-V and requires overall Max-Total A to complete functional and familiar tasks safely in regards to attention, orientation, problem solving, initiation and awareness. Patient continues to demonstrate language of confusion with confabulation and sexually inappropriate comments that requires redirection. Patient is also currently consuming regular textures with thin liquids without overt s/s of aspiration. Patient and family  education is ongoing. Patient would benefit from continued skilled SLP intervention to maximize his cognitive function and overall functional independence prior to discharge.      Intensity: Minumum of 1-2 x/day, 30 to 90 minutes Frequency: 3 to 5 out of 7 days Duration/Length of Stay: 3 weeks Treatment/Interventions: Cognitive remediation/compensation;Environmental controls;Internal/external aids;Therapeutic Activities;Patient/family education;Functional tasks;Cueing hierarchy   Daily Session  Skilled Therapeutic Interventions: Skilled treatment session focused on cognitive goals. SLP facilitated session by providing Max A multimodal cues and extra time for patient to sit EOB. Patient sat EOB for ~30 minutes and required Max A verbal and visual cues for orientation to place with use of external aids. Patient also consumed a snack of Dys. 1 textures with thin liquids without overt s/s of aspiration but required Max A verbal cues for selective attention to self-feeding in  a minimally distracting environment. Patient with mildly decreased verbosity but patient continues to be sexually inappropriate. Patient left upright in enclosure bed with all needs within reach. Continue with current plan of care.      Function:     Cognition Comprehension Comprehension assist level: Understands basic less than 25% of the time/ requires cueing >75% of the time  Expression   Expression assist level: Expresses basis less than 25% of the time/requires cueing >75% of the time.  Social Interaction Social Interaction assist level: Interacts appropriately less than 25% of the time. May be withdrawn or combative.  Problem Solving Problem solving assist level: Solves basic less than 25% of the time - needs direction nearly all the time or does not effectively solve problems and  may need a restraint for safety  Memory Memory assist level: Recognizes or recalls less than 25% of the time/requires cueing greater than  75% of the time   Pain No/DeniesPain  Therapy/Group: Individual Therapy  Keyleigh Manninen 06/14/2017, 4:07 PM

## 2017-06-14 NOTE — Progress Notes (Signed)
Occupational Therapy Session Note  Patient Details  Name: Patrick [name] MRN: [id number] Date of Birth: 08/02/1965  Today's Date: 06/14/2017 OT Individual Time: 0815-0900 and [id number] OT Individual Time Calculation (min): 45 min and 54 min    Short Term Goals: Week 1:  OT Short Term Goal 1 (Week 1): Pt will initiate one self care task within 5 minutes with max multimodal cues. OT Short Term Goal 2 (Week 1): Pt will attend to self care task in controlled environment for 60 seconds.  OT Short Term Goal 3 (Week 1): Pt will transfer onto toilet with min hand held assistance to decrease level of self care.   Skilled Therapeutic Interventions/Progress Updates:    Session 1:Upon entering the room, pt supine in bed with covers pulled over face and very tearful and anxious. Pt reports, " I have really messed myself up". He also verbalized he was in hospital and had accident when asked orientation to location and situation. Pt not orientated to name and when given a choice of two names he was unable to state his own. Pt becoming very verbose and making sexual comments. OT unable to redirect pt from behaviors. OT initiating music to calm pt and he began to tap hands and sing to music. Pt appearing much calmer and laying in bed. OT left music playing for pt with RN notified. Enclosure bed secured.   Session 2: Pt received at RN station and assisted via wheelchair back to room. Pt oriented to hospital and month as well as first name this session. Pt seated in wheelchair at sink and initiated washing face within 4 minutes with max multimodal cues. Pt required total A  With hand over hand to wash the rest of body secondary to decreased initiation, sequence, and attention to task. Pt verbalizes, " My vision is messed up." Pt unable to follow formal assessment. Pt taken to dynavision in quiet space with limited distractions. Pt able to hit and locate 2 targets on the R of board within 1 minute. Pt appears  to have R gaze preference as well. Pt returned to room and transferred with min A from wheelchair >bed with increased time and max multimodal cues to initiate task. Pt supine in bed and enclosure bed secured.   Therapy Documentation Precautions:  Precautions Precautions: Fall Precaution Comments: TBI/ do not push on center of forehead / depressed skull fx  Required Braces or Orthoses: Sling(RUE) Restrictions Weight Bearing Restrictions: Yes RUE Weight Bearing: Non weight bearing RLE Weight Bearing: Weight bearing as tolerated General:   Vital Signs: Therapy Vitals Pulse Rate: (!) 105 BP: 102/65 Pain: Pain Assessment Pain Assessment: Faces Faces Pain Scale: Hurts a little bit Pain Intervention(s): Medication (See eMAR) ADL:   Vision   Perception    Praxis   Exercises:   Other Treatments:    See Function Navigator for Current Functional Status.   Therapy/Group: Individual Therapy  Alen BleacherBradsher, Patrick [name] 06/14/2017, 11:09 AM

## 2017-06-15 ENCOUNTER — Inpatient Hospital Stay (HOSPITAL_COMMUNITY): Payer: No Typology Code available for payment source | Admitting: Physical Therapy

## 2017-06-15 ENCOUNTER — Inpatient Hospital Stay (HOSPITAL_COMMUNITY): Payer: Self-pay | Admitting: Speech Pathology

## 2017-06-15 ENCOUNTER — Inpatient Hospital Stay (HOSPITAL_COMMUNITY): Payer: Self-pay | Admitting: Physical Therapy

## 2017-06-15 ENCOUNTER — Inpatient Hospital Stay (HOSPITAL_COMMUNITY): Payer: Self-pay | Admitting: Occupational Therapy

## 2017-06-15 MED ORDER — QUETIAPINE FUMARATE 25 MG PO TABS
25.0000 mg | ORAL_TABLET | Freq: Two times a day (BID) | ORAL | Status: DC
  Administered 2017-06-15 – 2017-06-17 (×4): 25 mg via ORAL
  Filled 2017-06-15 (×4): qty 1

## 2017-06-15 NOTE — Progress Notes (Signed)
Occupational Therapy Session Note  Patient Details  Name: Patrick [name] MRN: [id number] Date of Birth: 10-13-1965  Today's Date: 06/15/2017 OT Individual Time: [id number] OT Individual Time Calculation (min): 44 min    Short Term Goals: Week 1:  OT Short Term Goal 1 (Week 1): Pt will initiate one self care task within 5 minutes with max multimodal cues. OT Short Term Goal 2 (Week 1): Pt will attend to self care task in controlled environment for 60 seconds.  OT Short Term Goal 3 (Week 1): Pt will transfer onto toilet with min hand held assistance to decrease level of self care.   Skilled Therapeutic Interventions/Progress Updates:    Pt greeted supine in bed, asleep, easily woken with encouragement to get OOB. He had pulled off his brief and bed was wet. Pt requiring max cues for sustained attention to get OOB. Upon standing, pt c/o nausea, dizziness, and pain in R LE. Stand pivot<w/c with Min A. Max cues provided to redirect him to ADL tasks, such as donning clean brief, hospital gown, and Teds. He ultimately required total A. Pt resistant to completion of hygiene tasks w/c level. He was able to initiate handwashing once faucet was turned on and soap was placed in hands. Pt was then escorted to RN station. Played favorite music at low volume for relaxation and behavioral mgt.   Throughout session, pt exhibits language of confusion, confabulation, and inappropriate sexual comments. Requires max cues for redirection.   RN notified regarding pts c/o nausea, dizziness, and pain.   Therapy Documentation Precautions:  Precautions Precautions: Fall Precaution Comments: TBI/ do not push on center of forehead / depressed skull fx  Required Braces or Orthoses: Sling(RUE) Restrictions Weight Bearing Restrictions: Yes RUE Weight Bearing: Non weight bearing RLE Weight Bearing: Weight bearing as tolerated Vital Signs:  Pain: Addressed as stated above   ADL:   See Function Navigator  for Current Functional Status.   Therapy/Group: Individual Therapy  Sarahanne Novakowski A Laykin Rainone 06/15/2017, 12:32 PM

## 2017-06-15 NOTE — Progress Notes (Signed)
Physical Therapy Session Note  Patient Details  Name: Patrick [name] MRN: [id number] Date of Birth: 1966-01-26  Today's Date: 06/15/2017 PT Individual Time: 1545-1630 PT Individual Time Calculation (min): 45 min   Short Term Goals: Week 1:  PT Short Term Goal 1 (Week 1): Pt will ambulate 75 ft with LRAD & min assist. PT Short Term Goal 1 - Progress (Week 1): Met PT Short Term Goal 2 (Week 1): Pt will be oriented to self without assistance. PT Short Term Goal 2 - Progress (Week 1): Progressing toward goal PT Short Term Goal 3 (Week 1): Pt will initiate stair training. PT Short Term Goal 3 - Progress (Week 1): Not met PT Short Term Goal 4 (Week 1): Pt will perform car transfer with min assist and LRAD. PT Short Term Goal 4 - Progress (Week 1): Not met  Skilled Therapeutic Interventions/Progress Updates:    no c/o pain, but winces with some RUE movement.  Session focus on sustained attention, following 1-step commands, awareness, and orientation within functional mobility.    Pt requires increased time, max cues, and ultimately min assist to initiate supine>sit.  Seated EOB focus on following 1 step directions for donning TEDs and transfer to w/c, and orientation to hospital and situation.  Pt requires frequent redirection to stay attended to task.  Pt stands impulsively with supervision stating he needs to use bathroom.  Min guard for ambulation into bathroom for continent void in standing, pt then turned to sit on commode with steady assist, noted to be partially incontinent of stool in brief.  PT provided assist to remove brief.  Hand off to NT at end of session.    Therapy Documentation Precautions:  Precautions Precautions: Fall Precaution Comments: TBI/ do not push on center of forehead / depressed skull fx  Required Braces or Orthoses: Sling(RUE) Restrictions Weight Bearing Restrictions: Yes RUE Weight Bearing: Non weight bearing RLE Weight Bearing: Weight bearing as  tolerated   See Function Navigator for Current Functional Status.   Therapy/Group: Individual Therapy  Michel Santee 06/15/2017, 4:54 PM

## 2017-06-15 NOTE — Progress Notes (Signed)
Physical Therapy Session Note  Patient Details  Name: Patrick [name] MRN: [id number] Date of Birth: Feb 17, 1966  Today's Date: 06/15/2017 PT Individual Time: [id number] and 1410-1435 PT Individual Time Calculation (min): 57 min and 25 min  Short Term Goals: Week 2:  PT Short Term Goal 1 (Week 2): Pt will ambulate 75 ft with LRAD & min assist. PT Short Term Goal 2 (Week 2): Pt will initiate stair training. PT Short Term Goal 3 (Week 2): Pt will perform car transfer with min assist and LRAD.  Skilled Therapeutic Interventions/Progress Updates:  Treatment 1: Pt received in enclosure bed. Pt with behaviors demonstrating pain in RUE & LLE with repositioning & rest breaks provided PRN. Session focused on attention to task, following one step commands, orientation, dynamic balance through functional mobility, and activity tolerance. Pt requires max multimodal cuing to initiate bed mobility and sit>stand transfers. Pt observed to be incontinent of urine & required total assist for donning clean brief & gown 2/2 poor attention to task. Pt performed hand hygiene at sink from w/c level with total assist to retreive soap, manage knobs, and retrieve paper towels but pt was able to engage in hand washing activity. Transported pt to/from gym via w/c total assist for time management. Pt ambulated 35 ft + 200 ft with LUE HHA min assist and antalgic gait as he appears to have LLE pain. Pt perseverative on freckles on legs being deer ticks despite redirection and orientation; pt also requires total assist for orientation to location & situation. Pt engaged in sorting activity but only able to attend to task for ~5 seconds at a time. When attending to task pt was able to select correct color pins 50% of the time. At the end of the session pt was left in enclosure bed with all needs within reach.   Treatment 2: Pt received in enclosure bed listening to music. Pt required encouragement to transfer to sitting EOB  and reluctantly did so. Pt tolerated sitting on EOB for ~15 minutes with supervision for sitting balance. Engaged pt in conversation and attempted to assist him OOB to w/c but pt refused multiple times. Pt with very poor attention to therapist or task and frequently falling asleep while sitting EOB. Pt reported fatigue and pain in RUE with movement/touch. Despite multiple efforts and attempts pt refused to transfer OOB. Pt returned to supine & left in enclosure bed with all needs within reach & music playing in room. Notified RN of pt's continued c/o pain in RUE with movement/touch.  Therapy Documentation Precautions:  Precautions Precautions: Fall Precaution Comments: TBI/ do not push on center of forehead / depressed skull fx  Required Braces or Orthoses: Sling(RUE) Restrictions Weight Bearing Restrictions: Yes RUE Weight Bearing: Non weight bearing RLE Weight Bearing: Weight bearing as tolerated   General: PT Amount of Missed Time (min): 35 Minutes PT Missed Treatment Reason: Patient unwilling to participate;Patient fatigue    See Function Navigator for Current Functional Status.   Therapy/Group: Individual Therapy  Sandi MariscalVictoria M Kenniya Westrich 06/15/2017, 2:53 PM

## 2017-06-15 NOTE — Progress Notes (Signed)
[facility] PHYSICAL MEDICINE & REHABILITATION     PROGRESS NOTE    Subjective/Complaints: Patient lying in bed awake.  Speaking incoherently, stating that he is going to "kill the president".   ROS: limited due to cogntion  Objective: Vital Signs: Blood pressure 120/70, pulse 86, temperature (!) 97.4 F (36.3 C), temperature source Oral, resp. rate 19, height 6' (1.829 m), weight 70 kg (154 lb 5.2 oz), SpO2 100 %. No results found. Recent Labs    06/13/17 0830  WBC 5.8  HGB 12.3*  HCT 37.8*  PLT 274   Recent Labs    06/13/17 0830  NA 142  K 3.8  CL 108  GLUCOSE 112*  BUN 10  CREATININE 0.83  CALCIUM 9.0   CBG (last 3)  No results for input(s): GLUCAP in the last 72 hours.  Wt Readings from Last 3 Encounters:  06/13/17 70 kg (154 lb 5.2 oz)  06/02/17 69.8 kg (153 lb 14.1 oz)    Physical Exam:  Constitutional: NAD. Vital signs reviewed. HENT: Depressed areas mid forehead-stable  Eyes: Eyes open.  No areas of drainage or irritation Cardiovascular: RRR without murmur. No JVD   Respiratory: CTA Bilaterally without wheezes or rales. Normal effort  GI: Bowel sounds are normal. He exhibits no distension.  Musculoskeletal: He exhibits no edema or tenderness in all extremities.   Neurological: More alert this morning.  Speech was clear.  Significant language of confusion and labile behavior  skin: Warm and dry. Intact. Psychiatric: Confused and restless  Assessment/Plan: 1.  Visual and cognitive deficits as well as balance problems secondary to polytrauma and TBI which require 3+ hours per day of interdisciplinary therapy in a comprehensive inpatient rehab setting. Physiatrist is providing close team supervision and 24 hour management of active medical problems listed below. Physiatrist and rehab team continue to assess barriers to discharge/monitor patient progress toward functional and medical goals.  Function:  Bathing Bathing position   Position:  Wheelchair/chair at sink  Bathing parts   Body parts bathed by helper: Right arm, Left arm, Chest, Abdomen, Front perineal area, Buttocks, Right upper leg, Left upper leg, Right lower leg, Left lower leg, Back  Bathing assist Assist Level: (total A)      Upper Body Dressing/Undressing Upper body dressing   What is the patient wearing?: Hospital gown       Pull over shirt/dress - Perfomed by helper: Thread/unthread right sleeve, Thread/unthread left sleeve, Put head through opening, Pull shirt over trunk        Upper body assist Assist Level: Touching or steadying assistance(Pt > 75%)      Lower Body Dressing/Undressing Lower body dressing   What is the patient wearing?: Non-skid slipper socks, Pants, Ted Hose       Pants- Performed by helper: Thread/unthread right pants leg, Thread/unthread left pants leg, Pull pants up/down   Non-skid slipper socks- Performed by helper: Don/doff right sock, Don/doff left sock               TED Hose - Performed by helper: Don/doff right TED hose, Don/doff left TED hose  Lower body assist        Toileting Toileting Toileting activity did not occur: No continent bowel/bladder event Toileting steps completed by patient: Adjust clothing prior to toileting Toileting steps completed by helper: Adjust clothing prior to toileting, Performs perineal hygiene, Adjust clothing after toileting Toileting Assistive Devices: Grab bar or rail  Toileting assist Assist level: Touching or steadying assistance (Pt.75%)   Transfers  Chair/bed transfer Chair/bed transfer activity did not occur: Safety/medical concerns Chair/bed transfer method: Stand pivot Chair/bed transfer assist level: Touching or steadying assistance (Pt > 75%) Chair/bed transfer assistive device: Armrests     Locomotion Ambulation     Max distance: 125 ft  Assist level: Touching or steadying assistance (Pt > 75%)   Wheelchair Wheelchair activity did not occur: Safety/medical  concerns        Cognition Comprehension Comprehension assist level: Understands basic less than 25% of the time/ requires cueing >75% of the time  Expression Expression assist level: Expresses basis less than 25% of the time/requires cueing >75% of the time.  Social Interaction Social Interaction assist level: Interacts appropriately less than 25% of the time. May be withdrawn or combative.  Problem Solving Problem solving assist level: Solves basic less than 25% of the time - needs direction nearly all the time or does not effectively solve problems and may need a restraint for safety  Memory Memory assist level: Recognizes or recalls less than 25% of the time/requires cueing greater than 75% of the time   Medical Problem List and Plan: 1.  Visual deficits, cognitive deficits with inappropriate behaviors, balance deficits affecting ability to carry out ADL tasks and mobility secondary to polytrauma with TBI.   Continue CIR   Patient with ongoing substantial cognitive deficits 2.  DVT Prophylaxis/Anticoagulation: Pharmaceutical: Lovenox   Vascular study negative for DVT 3. Pain Management: Hold oxycodone to improve daytime arousal.  Use tramadol for breakthrough pain or tylenol prn depending of pain intensity. 4. Mood: Team to provide ego support to continue help manage anxiety. LCSW to follow for evaluation and support when appropriate.   5. Neuropsych: This patient is not capable of making decisions on his ownbehalf.   -Continue Ritalin for attention and focus--increase to 10 mg 6. Skin/Wound Care: Pressure relief measures 7. Fluids/Electrolytes/Nutrition: I/Os 8. Mild hyponatremia:    Sodium up to 142 . 9.  Leucocytosis: Resolved   WBCs decreased to 5.8   10. Agitation/emotional dyscontrol: Continue scheduled valproic acid, increased to 500 mg 3 times daily    -Check valproic acid level on Monday   -Continue Seroquel at night for sleep.  We will schedule Seroquel during the day to  help with psychotic behavior and agitation    -sleep wake chart   -Continue enclosure bed for safety 11. H/o polysubstance abuse: Educate on cessation when appropriate.    LOS (Days) 9 A FACE TO FACE EVALUATION WAS PERFORMED  Ranelle OysterSWARTZ,Paul Trettin T, MD 06/15/2017 9:47 AM

## 2017-06-15 NOTE — Progress Notes (Signed)
Speech Language Pathology Daily Session Note  Patient Details  Name: Van ClinesDoyle Dean [name] MRN: [id number] Date of Birth: 03/14/1966  Today's Date: 06/15/2017 SLP Individual Time: 1300-1350 SLP Individual Time Calculation (min): 50 min  Short Term Goals: Week 2: SLP Short Term Goal 1 (Week 2): Patient will demonstrate sustained attention to a task for 60 seconds with Max A verbal cues for redirection.  SLP Short Term Goal 2 (Week 2): Patient will initiate a functional task with Max A multimodal cues in 50% of opportunities  SLP Short Term Goal 3 (Week 2): Patient will orient to person, place and situation with Max A multimodal cues.   Skilled Therapeutic Interventions:  Pt was seen for skilled ST targeting dysphagia and cognitive goals.  Pt was in enclosure bed upon arrival, awake, and agreeable to sitting at edge of bed for lunch.  Pt fed himself a cheeseburger and french fries with max assist cues for safety with PO intake due to decreased awareness of boluses and repeatedly attempting to lay down with food in his mouth.  No overt s/s of aspiration were evident with solids or liquids and pt had complete clearance of solids from the oral cavity with max cues for use of liquid wash.  Pt was sexually inappropriate throughout therapy session and was only able to be briefly redirected with max cues.  Pt would only intermittently and briefly engage in conversations about his favorite musician while music played in the background; however, pt's verbalizations mostly centered around sexually explicit content or "wanting to take him out back and shoot him."  Attempted to engage pt in a simple 1-step sorting task to address attention but pt needed hand over hand assist to initiate and became agitated with therapist's attempts to cue; therefore task was abandoned.  Pt was left resting in enclosure bed with call bell within reach.  Continue per current plan of care.    Function:  Eating Eating   Modified  Consistency Diet: No Eating Assist Level: Supervision or verbal cues           Cognition Comprehension Comprehension assist level: Understands basic less than 25% of the time/ requires cueing >75% of the time  Expression   Expression assist level: Expresses basis less than 25% of the time/requires cueing >75% of the time.  Social Interaction Social Interaction assist level: Interacts appropriately less than 25% of the time. May be withdrawn or combative.  Problem Solving Problem solving assist level: Solves basic less than 25% of the time - needs direction nearly all the time or does not effectively solve problems and may need a restraint for safety  Memory Memory assist level: Recognizes or recalls less than 25% of the time/requires cueing greater than 75% of the time    Pain Pain Assessment Pain Assessment: Faces Faces Pain Scale: Hurts little more Pain Location: Arm Pain Orientation: Right Pain Descriptors / Indicators: Aching Pain Intervention(s): Rest;Repositioned;Distraction  Therapy/Group: Individual Therapy  Landy Dunnavant, Melanee SpryNicole L 06/15/2017, 3:46 PM

## 2017-06-16 ENCOUNTER — Inpatient Hospital Stay (HOSPITAL_COMMUNITY): Payer: Self-pay | Admitting: Physical Therapy

## 2017-06-16 LAB — URINALYSIS, ROUTINE W REFLEX MICROSCOPIC
Bilirubin Urine: NEGATIVE
GLUCOSE, UA: NEGATIVE mg/dL
HGB URINE DIPSTICK: NEGATIVE
Ketones, ur: 5 mg/dL — AB
Nitrite: POSITIVE — AB
Protein, ur: 30 mg/dL — AB
SPECIFIC GRAVITY, URINE: 1.02 (ref 1.005–1.030)
pH: 6 (ref 5.0–8.0)

## 2017-06-16 MED ORDER — CEPHALEXIN 250 MG PO CAPS
250.0000 mg | ORAL_CAPSULE | Freq: Three times a day (TID) | ORAL | Status: AC
  Administered 2017-06-16 – 2017-06-21 (×15): 250 mg via ORAL
  Filled 2017-06-16 (×15): qty 1

## 2017-06-16 NOTE — Progress Notes (Signed)
Physical Therapy Session Note  Patient Details  Name: Levin Dagostino MRN: [id number] Date of Birth: 09-Oct-1965  Today's Date: 06/16/2017 PT Individual Time: 7026-3785 PT Individual Time Calculation (min): 45 min  and Today's Date: 06/16/2017 PT Missed Time: 15 Minutes Missed Time Reason: Patient fatigue  Short Term Goals: Week 2:  PT Short Term Goal 1 (Week 2): Pt will ambulate 75 ft with LRAD & min assist. PT Short Term Goal 2 (Week 2): Pt will initiate stair training. PT Short Term Goal 3 (Week 2): Pt will perform car transfer with min assist and LRAD.  Skilled Therapeutic Interventions/Progress Updates:   Pt in w/c and agreeable to therapy w/ encouragement, no c/o pain and family present saying goodbye to pt. Total assist w/c transport to/from gym 2/2 time management and impaired cognition. Put on music in gym to assist w/ calming pt, very tearful saying goodbye to family members. Session focused on cognitive remediation including initiation of mobility, following commands, and engagement in purposeful upright activity. Max encouragement and cues to stand at high/low table to perform standing sorting task. Stood for 2-3 minutes w/ min guard, unable to accurately follow commands to perform task successfully. Pt then reporting feeling hot and nauseous, returned to sitting at which point complaints stopped. Pt perseverating on "bugs on skin" and "wanting to die", max and constant cues to redirect. Performed seated card matching game w/ max cues to attend to task. Pt w/ increased agitation and perseveration, unable to engage in task, and head down on arm. Pt taken back to room and requesting to stay up in w/c and sleep, returned to nurses station and ended session at nurses station, QRB engaged and chair alarm on. RN aware and all needs met. Missed 15 minutes of skilled PT 2/2 fatigue.   Therapy Documentation Precautions:  Precautions Precautions: Fall Precaution Comments: TBI/ do not  push on center of forehead / depressed skull fx  Required Braces or Orthoses: Sling(RUE) Restrictions Weight Bearing Restrictions: Yes RUE Weight Bearing: Non weight bearing RLE Weight Bearing: Weight bearing as tolerated Vital Signs: Therapy Vitals Temp: 98.2 F (36.8 C) Temp Source: Oral Pulse Rate: (!) 112 Resp: 16 BP: 103/73 Patient Position (if appropriate): Sitting Oxygen Therapy SpO2: 100 % O2 Device: Not Delivered  See Function Navigator for Current Functional Status.   Therapy/Group: Individual Therapy  Beryl Balz K Arnette 06/16/2017, 4:21 PM

## 2017-06-16 NOTE — Progress Notes (Signed)
[facility] PHYSICAL MEDICINE & REHABILITATION     PROGRESS NOTE    Subjective/Complaints: Patient sitting up in his bed this morning when I arrived.  No new issues.  Unclear how much he slept last night  ROS: pt denies nausea, vomiting, diarrhea, cough, shortness of breath or chest pain   Objective: Vital Signs: Blood pressure 110/75, pulse 97, temperature 98 F (36.7 C), temperature source Oral, resp. rate 18, height 6' (1.829 m), weight 70 kg (154 lb 5.2 oz), SpO2 100 %. No results found. No results for input(s): WBC, HGB, HCT, PLT in the last 72 hours. No results for input(s): NA, K, CL, GLUCOSE, BUN, CREATININE, CALCIUM in the last 72 hours.  Invalid input(s): CO CBG (last 3)  No results for input(s): GLUCAP in the last 72 hours.  Wt Readings from Last 3 Encounters:  06/13/17 70 kg (154 lb 5.2 oz)  06/02/17 69.8 kg (153 lb 14.1 oz)    Physical Exam:  Constitutional: NAD. Vital signs reviewed. HENT: Depressed areas mid forehead-stable  Eyes: Eyes open.  No areas of drainage or irritation Cardiovascular: RRR without murmur. No JVD   Respiratory: CTA Bilaterally without wheezes or rales. Normal effort  GI: Bowel sounds are normal. He exhibits no distension.  Musculoskeletal: He exhibits no edema or tenderness in all extremities.   Neurological: More alert this morning.  Speech was clear.  Significant language of confusion and labile behavior  skin: Warm and dry. Intact. Psychiatric: Confused and restless  Assessment/Plan: 1.  Visual and cognitive deficits as well as balance problems secondary to polytrauma and TBI which require 3+ hours per day of interdisciplinary therapy in a comprehensive inpatient rehab setting. Physiatrist is providing close team supervision and 24 hour management of active medical problems listed below. Physiatrist and rehab team continue to assess barriers to discharge/monitor patient progress toward functional and medical  goals.  Function:  Bathing Bathing position   Position: Wheelchair/chair at sink  Bathing parts   Body parts bathed by helper: Right arm, Left arm, Chest, Abdomen, Front perineal area, Buttocks, Right upper leg, Left upper leg, Right lower leg, Left lower leg, Back  Bathing assist Assist Level: (total A)      Upper Body Dressing/Undressing Upper body dressing   What is the patient wearing?: Hospital gown       Pull over shirt/dress - Perfomed by helper: Thread/unthread right sleeve, Thread/unthread left sleeve, Put head through opening, Pull shirt over trunk        Upper body assist Assist Level: Touching or steadying assistance(Pt > 75%)      Lower Body Dressing/Undressing Lower body dressing   What is the patient wearing?: Non-skid slipper socks, Ted Hose       Pants- Performed by helper: Thread/unthread right pants leg, Thread/unthread left pants leg, Pull pants up/down   Non-skid slipper socks- Performed by helper: Don/doff right sock, Don/doff left sock               TED Hose - Performed by helper: Don/doff right TED hose, Don/doff left TED hose  Lower body assist        Toileting Toileting Toileting activity did not occur: No continent bowel/bladder event Toileting steps completed by patient: Adjust clothing prior to toileting Toileting steps completed by helper: Adjust clothing prior to toileting, Performs perineal hygiene, Adjust clothing after toileting Toileting Assistive Devices: Grab bar or rail  Toileting assist Assist level: Touching or steadying assistance (Pt.75%)   Transfers Chair/bed transfer Chair/bed transfer activity did  not occur: Safety/medical concerns Chair/bed transfer method: Ambulatory Chair/bed transfer assist level: Touching or steadying assistance (Pt > 75%) Chair/bed transfer assistive device: (HHA)     Locomotion Ambulation     Max distance: 200 ft Assist level: Touching or steadying assistance (Pt > 75%)   Wheelchair  Wheelchair activity did not occur: Safety/medical concerns        Cognition Comprehension Comprehension assist level: Understands basic less than 25% of the time/ requires cueing >75% of the time  Expression Expression assist level: Expresses basis less than 25% of the time/requires cueing >75% of the time.  Social Interaction Social Interaction assist level: Interacts appropriately less than 25% of the time. May be withdrawn or combative.  Problem Solving Problem solving assist level: Solves basic less than 25% of the time - needs direction nearly all the time or does not effectively solve problems and may need a restraint for safety  Memory Memory assist level: Recognizes or recalls less than 25% of the time/requires cueing greater than 75% of the time   Medical Problem List and Plan: 1.  Visual deficits, cognitive deficits with inappropriate behaviors, balance deficits affecting ability to carry out ADL tasks and mobility secondary to polytrauma with TBI.   Continue CIR   Patient with ongoing substantial cognitive and behavioral deficits 2.  DVT Prophylaxis/Anticoagulation: Pharmaceutical: Lovenox   Vascular study negative for DVT 3. Pain Management: Hold oxycodone to improve daytime arousal.  Use tramadol for breakthrough pain or tylenol prn depending of pain intensity. 4. Mood: Team to provide ego support to continue help manage anxiety. LCSW to follow for evaluation and support when appropriate.   5. Neuropsych: This patient is not capable of making decisions on his ownbehalf.   -Continue Ritalin for attention and focus--increased to 10 mg 6. Skin/Wound Care: Pressure relief measures 7. Fluids/Electrolytes/Nutrition: I/Os 8. Mild hyponatremia:    Sodium up to 142 . 9.  Leucocytosis: Resolved   WBCs decreased to 5.8   10. Agitation/emotional dyscontrol: Continue scheduled valproic acid, increased to 500 mg 3 times daily    -Check valproic acid level on Monday   -Continue Seroquel at  night for sleep.  Added Seroquel during the day to help with psychotic behavior and agitation.  Consider change to Risperdal    -sleep wake chart   -Continue enclosure bed for safety 11. H/o polysubstance abuse: Educate on cessation when appropriate.    LOS (Days) 10 A FACE TO FACE EVALUATION WAS PERFORMED  Ranelle OysterSWARTZ,ZACHARY T, MD 06/16/2017 9:34 AM

## 2017-06-17 ENCOUNTER — Other Ambulatory Visit: Payer: Self-pay

## 2017-06-17 ENCOUNTER — Inpatient Hospital Stay (HOSPITAL_COMMUNITY): Payer: Self-pay | Admitting: Occupational Therapy

## 2017-06-17 ENCOUNTER — Inpatient Hospital Stay (HOSPITAL_COMMUNITY): Payer: Self-pay

## 2017-06-17 ENCOUNTER — Encounter (HOSPITAL_COMMUNITY): Payer: Self-pay

## 2017-06-17 MED ORDER — RISPERIDONE 0.25 MG PO TABS
0.2500 mg | ORAL_TABLET | Freq: Two times a day (BID) | ORAL | Status: DC
  Administered 2017-06-17 – 2017-06-19 (×5): 0.25 mg via ORAL
  Filled 2017-06-17 (×6): qty 1

## 2017-06-17 NOTE — Progress Notes (Signed)
Physical Therapy Session Note  Patient Details  Name: Patrick [name] MRN: [id number] Date of Birth: 12-31-1965  Today's Date: 06/17/2017 PT Individual Time: 1445-1525 PT Individual Time Calculation (min): 40 min   Short Term Goals: Week 2:  PT Short Term Goal 1 (Week 2): Pt will ambulate 75 ft with LRAD & min assist. PT Short Term Goal 2 (Week 2): Pt will initiate stair training. PT Short Term Goal 3 (Week 2): Pt will perform car transfer with min assist and LRAD.  Skilled Therapeutic Interventions/Progress Updates:    Pt presents EOB eating lunch. PT provided supervision while pt finishing lunch and engaged in cognitive remediation and therapeutic listening as pt tearful throughout and perseverating on issues with his children and his sister. Pt also frequently bringing up "needing to kill them", "it's just not fair to anyone" and "I just don't know what to do." Attempted to redirect pt and engage in OOB mobility for change of scenery but once pt would agree, would then be unable to initiate and then declined. Pt would perseverate back to topics described above. Pt falling asleep at times throughout while seated EOB and then startled awake. Once PT brought w/c to bed to attempt transfer, pt stated that there was no way he could get up right now and was going to be sick. No vomitting occurred and pt denies nausea when asked. Suggested to return to bed to rest as pt falling asleep again randomly while seated EOB, requiring max cues for redirection and extra time with mod assist to initiate to supine. Enclosure bed closed and all needs in reach. Pt repeatedly stating to therapist how much he "loves everyone around here." Left to rest.   Therapy Documentation Precautions:  Precautions Precautions: Fall Precaution Comments: TBI/ do not push on center of forehead / depressed skull fx  Required Braces or Orthoses: Sling(RUE) Restrictions Weight Bearing Restrictions: Yes RUE Weight Bearing:  Non weight bearing RLE Weight Bearing: Weight bearing as tolerated  Pain:  no reports of pain.    See Function Navigator for Current Functional Status.   Therapy/Group: Individual Therapy  Patrick [name], Patrick [name]  Patrick [name], PT, DPT  06/17/2017, 3:43 PM

## 2017-06-17 NOTE — Progress Notes (Signed)
Occupational Therapy Weekly Progress Note  Patient Details  Name: Patrick [name] MRN: [id number] Date of Birth: 06/21/1965  Beginning of progress report period: 06/07/17 End of progress report period: 06/17/17  Patient has met 1 of 3 short term goals.    Pt has made inconsistent progress during time of report. He exhibits behaviors consistent with those of Level IV on the Rancho Los Amigos Scale. He requires Max-Total A and total cues for attention, sequencing, initiation, awareness, and self organization during self care tasks. When willing, pt can complete toilet transfers with Min A HHA. He continues to exhibit inappropriate sexual comments/behaviors, language of confusion, and confabulation, requiring max cues to redirect him to functional tasks. Skilled OT will continue to focus on stated deficits, as well as pt/family education, endurance, and balance, during next report period to increase functional independence with self care.   Patient continues to demonstrate the following deficits: muscle weakness, decreased cardiorespiratoy endurance, motor apraxia, decreased coordination and decreased motor planning, decreased visual acuity, decreased motor planning and ideational apraxia, decreased initiation, decreased attention, decreased awareness, decreased problem solving, decreased safety awareness, decreased memory and delayed processing and decreased standing balance, decreased postural control, decreased balance strategies and difficulty maintaining precautions and therefore will continue to benefit from skilled OT intervention to enhance overall performance with BADL.  Patient progressing toward long term goals..  Continue plan of care.  OT Short Term Goals Week 1:  OT Short Term Goal 1 (Week 1): Pt will initiate one self care task within 5 minutes with max multimodal cues. OT Short Term Goal 1 - Progress (Week 1): Progressing toward goal OT Short Term Goal 2 (Week 1): Pt will attend  to self care task in controlled environment for 60 seconds.  OT Short Term Goal 2 - Progress (Week 1): Progressing toward goal OT Short Term Goal 3 (Week 1): Pt will transfer onto toilet with min hand held assistance to decrease level of self care.  OT Short Term Goal 3 - Progress (Week 1): Met Week 2:  OT Short Term Goal 1 (Week 2): Pt will initiate bathing 1 area of body during shower  OT Short Term Goal 2 (Week 2): Pt will attend to UB dressing task for 10 seconds to complete at least 1/4 components  OT Short Term Goal 3 (Week 2): Pt will be A&Ox1 consistently during BADL sessions  Therapy Documentation Precautions:  Precautions Precautions: Fall Precaution Comments: TBI/ do not push on center of forehead / depressed skull fx  Required Braces or Orthoses: Sling(RUE) Restrictions Weight Bearing Restrictions: Yes RUE Weight Bearing: Non weight bearing RLE Weight Bearing: Weight bearing as tolerated Vital Signs: Therapy Vitals Temp: 98 F (36.7 C) Temp Source: Oral Pulse Rate: 100 Resp: 18 BP: 100/70 Patient Position (if appropriate): Lying Oxygen Therapy SpO2: 99 % O2 Device: Not Delivered Pain: Pain Assessment Pain Assessment: 0-10 Pain Score: 6  Pain Type: Acute pain Pain Location: Arm Pain Orientation: Right Pain Descriptors / Indicators: Burning Pain Frequency: Intermittent Pain Onset: On-going Patients Stated Pain Goal: 3 Pain Intervention(s): Medication (See eMAR) ADL:      See Function Navigator for Current Functional Status.   Therapy/Group: Individual Therapy   A  06/17/2017, 7:40 AM   

## 2017-06-17 NOTE — Progress Notes (Signed)
[facility] PHYSICAL MEDICINE & REHABILITATION     PROGRESS NOTE    Subjective/Complaints: Sitting up in bed.  Listening to [employer]ank Williams Junior on the radio.  Was able to identify the artist in fact.c   ROS: Unable to obtain due to cognitive/mental status issues.    Objective: Vital Signs: Blood pressure 100/70, pulse 100, temperature 98 F (36.7 C), temperature source Oral, resp. rate 18, height 6' (1.829 m), weight 70 kg (154 lb 5.2 oz), SpO2 99 %. No results found. No results for input(s): WBC, HGB, HCT, PLT in the last 72 hours. No results for input(s): NA, K, CL, GLUCOSE, BUN, CREATININE, CALCIUM in the last 72 hours.  Invalid input(s): CO CBG (last 3)  No results for input(s): GLUCAP in the last 72 hours.  Wt Readings from Last 3 Encounters:  06/13/17 70 kg (154 lb 5.2 oz)  06/02/17 69.8 kg (153 lb 14.1 oz)    Physical Exam:  Constitutional: NAD. Vital signs reviewed. HENT: Depressed areas mid forehead-stable  Eyes: Eyes open.  No areas of drainage or irritation Cardiovascular: RRR without murmur. No JVD   Respiratory: CTA Bilaterally without wheezes or rales. Normal effort  GI: Bowel sounds are normal. He exhibits no distension.  Musculoskeletal: He exhibits no edema or tenderness in all extremities.   Neurological:  alert this morning.  Speech was clear.  Continued language of confusion and labile behavior  skin: Warm and dry. Intact. Psychiatric: Confused and restless  Assessment/Plan: 1.  Visual and cognitive deficits as well as balance problems secondary to polytrauma and TBI which require 3+ hours per day of interdisciplinary therapy in a comprehensive inpatient rehab setting. Physiatrist is providing close team supervision and 24 hour management of active medical problems listed below. Physiatrist and rehab team continue to assess barriers to discharge/monitor patient progress toward functional and medical goals.  Function:  Bathing Bathing position    Position: Wheelchair/chair at sink  Bathing parts   Body parts bathed by helper: Right arm, Left arm, Chest, Abdomen, Front perineal area, Buttocks, Right upper leg, Left upper leg, Right lower leg, Left lower leg, Back  Bathing assist Assist Level: (total A)      Upper Body Dressing/Undressing Upper body dressing   What is the patient wearing?: Hospital gown       Pull over shirt/dress - Perfomed by helper: Thread/unthread right sleeve, Thread/unthread left sleeve, Put head through opening, Pull shirt over trunk        Upper body assist Assist Level: Touching or steadying assistance(Pt > 75%)      Lower Body Dressing/Undressing Lower body dressing   What is the patient wearing?: Non-skid slipper socks, Ted Hose       Pants- Performed by helper: Thread/unthread right pants leg, Thread/unthread left pants leg, Pull pants up/down   Non-skid slipper socks- Performed by helper: Don/doff right sock, Don/doff left sock               TED Hose - Performed by helper: Don/doff right TED hose, Don/doff left TED hose  Lower body assist        Toileting Toileting Toileting activity did not occur: No continent bowel/bladder event Toileting steps completed by patient: Adjust clothing prior to toileting Toileting steps completed by helper: Adjust clothing prior to toileting, Performs perineal hygiene, Adjust clothing after toileting Toileting Assistive Devices: Grab bar or rail  Toileting assist Assist level: Touching or steadying assistance (Pt.75%)   Transfers Chair/bed transfer Chair/bed transfer activity did not occur: Safety/medical  concerns Chair/bed transfer method: Ambulatory Chair/bed transfer assist level: Touching or steadying assistance (Pt > 75%) Chair/bed transfer assistive device: (HHA)     Locomotion Ambulation     Max distance: 200 ft Assist level: Touching or steadying assistance (Pt > 75%)   Wheelchair Wheelchair activity did not occur: Safety/medical  concerns        Cognition Comprehension Comprehension assist level: Understands basic less than 25% of the time/ requires cueing >75% of the time  Expression Expression assist level: Expresses basis less than 25% of the time/requires cueing >75% of the time.  Social Interaction Social Interaction assist level: Interacts appropriately less than 25% of the time. May be withdrawn or combative.  Problem Solving Problem solving assist level: Solves basic less than 25% of the time - needs direction nearly all the time or does not effectively solve problems and may need a restraint for safety  Memory Memory assist level: Recognizes or recalls less than 25% of the time/requires cueing greater than 75% of the time   Medical Problem List and Plan: 1.  Visual deficits, cognitive deficits with inappropriate behaviors, balance deficits affecting ability to carry out ADL tasks and mobility secondary to polytrauma with TBI.   Continue CIR   Patient with ongoing substantial cognitive and behavioral deficits 2.  DVT Prophylaxis/Anticoagulation: Pharmaceutical: Lovenox   Vascular study negative for DVT 3. Pain Management: Hold oxycodone to improve daytime arousal.  Use tramadol for breakthrough pain or tylenol prn depending of pain intensity. 4. Mood: Team to provide ego support to continue help manage anxiety. LCSW to follow for evaluation and support when appropriate.    -Music therapy  5. Neuropsych: This patient is not capable of making decisions on his ownbehalf.   -Continue Ritalin for attention and focus--increased to 10 mg 6. Skin/Wound Care: Pressure relief measures 7. Fluids/Electrolytes/Nutrition: I/Os 8. Mild hyponatremia:    Sodium up to 142 . 9.  Leucocytosis: Resolved   WBCs decreased to 5.8   10. Agitation/emotional dyscontrol: Continue scheduled valproic acid, increased to 500 mg 3 times daily    -Check valproic acid level on Monday   -Continue Seroquel at night for sleep.  Added  Seroquel during the day to help with psychotic behavior and agitation--- change to Risperdal, music therapy    -sleep wake chart   -Continue enclosure bed for safety 11. H/o polysubstance abuse: Educate on cessation when appropriate.    LOS (Days) 11 A FACE TO FACE EVALUATION WAS PERFORMED  Ranelle Oyster, MD 06/17/2017 8:59 AM

## 2017-06-17 NOTE — Progress Notes (Signed)
Occupational Therapy Session Note  Patient Details  Name: Patrick [name] MRN: [id number] Date of Birth: 1965/09/02  Today's Date: 06/17/2017 OT Individual Time: 1000-1058 OT Individual Time Calculation (min): 58 min    Short Term Goals: Week 1:  OT Short Term Goal 1 (Week 1): Pt will initiate one self care task within 5 minutes with max multimodal cues. OT Short Term Goal 2 (Week 1): Pt will attend to self care task in controlled environment for 60 seconds.  OT Short Term Goal 3 (Week 1): Pt will transfer onto toilet with min hand held assistance to decrease level of self care.   Skilled Therapeutic Interventions/Progress Updates:    Treatment session focused on self care/ADL training, activity tolerance, sequencing/cognitive retraining, pt education.  Upon entering pt lying in bed. Noted to have continual remorseful comments throughout session "I'm going to kill him, take me outback and kill me." did not display inappropriate comments or gestures towards therapist this session.  Pt required additional verbal prompts to initiate in bed mobility, c/o pain in R UE. Moved EOB with S. Encouraged to engage in UB washing EOB but unable to redirect to task. Perseverates on "bugs crawling on him", "dog on the floor." Required therapist to provide total A for UB washing. Therapist provided instructions for sit<>stand transfer to w/c but pt unable to initiate task. Therapist provided TUOS to encourage pt to engage in task. Pts emotions moved from remorseful to crying suddenly throughout session. Pt tolerated sitting upright with noted head flexion and unable to maintain upright posture EOB. C/o fatigue and requesting to lay down. Required several minutes to move from sit to lying to intiate in task. Pt repositioned in bed with all needs met and left to rest.   Therapy Documentation Precautions:  Precautions Precautions: Fall Precaution Comments: TBI/ do not push on center of forehead /  depressed skull fx  Required Braces or Orthoses: Sling(RUE) Restrictions Weight Bearing Restrictions: Yes RUE Weight Bearing: Non weight bearing RLE Weight Bearing: Weight bearing as tolerated   Pain: Pain Assessment Pain Assessment: Faces Pain Score: Asleep Faces Pain Scale: Hurts little more Pain Type: Acute pain Pain Location: Arm Pain Orientation: Right Pain Descriptors / Indicators: Aching Pain Onset: Unable to tell Patients Stated Pain Goal: 0 Pain Intervention(s): Repositioned Multiple Pain Sites: No See Function Navigator for Current Functional Status.   Therapy/Group: Individual Therapy  Delon [place] 06/17/2017, 11:10 AM

## 2017-06-18 ENCOUNTER — Inpatient Hospital Stay (HOSPITAL_COMMUNITY): Payer: No Typology Code available for payment source | Admitting: Speech Pathology

## 2017-06-18 ENCOUNTER — Inpatient Hospital Stay (HOSPITAL_COMMUNITY): Payer: Self-pay | Admitting: Occupational Therapy

## 2017-06-18 ENCOUNTER — Inpatient Hospital Stay (HOSPITAL_COMMUNITY): Payer: No Typology Code available for payment source | Admitting: Physical Therapy

## 2017-06-18 NOTE — Progress Notes (Signed)
Speech Language Pathology Daily Session Notes  Patient Details  Name: Patrick [name] MRN: [id number] Date of Birth: 10-07-1965  Today's Date: 06/18/2017  Session 1: SLP Individual Time: 0730-0800 SLP Individual Time Calculation (min): 30 min   Session 2: SLP Individual Time: 1400-1500 SLP Individual Time Calculation (min): 60 min  Short Term Goals: Week 2: SLP Short Term Goal 1 (Week 2): Patient will demonstrate sustained attention to a task for 60 seconds with Max A verbal cues for redirection.  SLP Short Term Goal 2 (Week 2): Patient will initiate a functional task with Max A multimodal cues in 50% of opportunities  SLP Short Term Goal 3 (Week 2): Patient will orient to person, place and situation with Max A multimodal cues.   Skilled Therapeutic Interventions:  Session 1: Skilled treatment session focused on cognitive goals. SLP facilitated session by providing extra time and Max A multimodal cues for patient to sit EOB and for initiation of self-feeding. Patient consumed meal without overt s/s of aspiration. Patient perseverative on "feeling sick" and required frequent redirection to functional tasks for ~30 second intervals. Patient handed off to PT. Continue with current plan of care.   Session 2: Skilled treatment session focused on cognitive goals. SLP facilitated session by providing extra time and Max A verbal cues for initiation to transfer to the wheelchair. Patient perseverative on on "losing his kids" and required Max A verbal cues for focused attention to basic sorting tasks. Although patient required Max A verbal cues for attention for was Mod I to sort coins from a field of 4 and dollar bills from a field of 2. Patient left supine in enclosure bed. Continue with current plan of care.   Function:  Eating Eating   Modified Consistency Diet: No Eating Assist Level: Supervision or verbal cues;Set up assist for   Eating Set Up Assist For: Opening containers        Cognition Comprehension Comprehension assist level: Understands basic less than 25% of the time/ requires cueing >75% of the time  Expression   Expression assist level: Expresses basis less than 25% of the time/requires cueing >75% of the time.  Social Interaction Social Interaction assist level: Interacts appropriately less than 25% of the time. May be withdrawn or combative.  Problem Solving Problem solving assist level: Solves basic less than 25% of the time - needs direction nearly all the time or does not effectively solve problems and may need a restraint for safety  Memory Memory assist level: Recognizes or recalls less than 25% of the time/requires cueing greater than 75% of the time    Pain Reports feeling "sick," unable to elaborate   Therapy/Group: Individual Therapy  Tykeshia Tourangeau 06/18/2017, 3:38 PM

## 2017-06-18 NOTE — Progress Notes (Signed)
Physical Therapy Session Note  Patient Details  Name: Patrick [name] MRN: [id number] Date of Birth: 1965/10/17  Today's Date: 06/18/2017 PT Individual Time: 0803-0900 PT Individual Time Calculation (min): 57 min   Short Term Goals: Week 2:  PT Short Term Goal 1 (Week 2): Pt will ambulate 75 ft with LRAD & min assist. PT Short Term Goal 2 (Week 2): Pt will initiate stair training. PT Short Term Goal 3 (Week 2): Pt will perform car transfer with min assist and LRAD.  Skilled Therapeutic Interventions/Progress Updates:  Pt received in handoff from SLP. Pt with c/o RUE pain - RN made aware & administered meds. Pt eating breakfast when therapist arrived and finished consuming breakfast while engaging in conversation focused on orientation. Pt able to recall therapist's name 50% of the time with moderate cuing throughout session; pt also recalled his birthday. Pt requires total assist for orientation to time, place, and situation. Pt very emotional throughout session, crying frequently despite max cuing for redirection. Pt eventually transferred EOB>w/c via stand pivot with min HHA. Pt with very short attention to task (<5 seconds). Transported pt to gym via w/c total assist for time management. Pt ambulates 100 ft + 100 ft with LUE HHA min assist with antalgic gait and task focusing on balance, NMR, and functional mobility. Pt requires cuing to maintain NWB RUE. Pt frequently stating "I'm going to be sick" throughout session. At end of session pt left in handoff to OT.  Therapy Documentation Precautions:  Precautions Precautions: Fall Precaution Comments: TBI/ do not push on center of forehead / depressed skull fx  Required Braces or Orthoses: Sling(RUE) Restrictions Weight Bearing Restrictions: Yes RUE Weight Bearing: Non weight bearing RLE Weight Bearing: Weight bearing as tolerated    See Function Navigator for Current Functional Status.   Therapy/Group: Individual  Therapy  Sandi MariscalVictoria M [name] 06/18/2017, 9:13 AM
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
## 2017-06-18 NOTE — Progress Notes (Signed)
[facility] PHYSICAL MEDICINE & REHABILITATION     PROGRESS NOTE    Subjective/Complaints: Finishing up breakfast at bedside when I arrived this morning.  He is a bit emotional and talked about his wife being in the room and that she was "evil"  ROS: Limited due to cognitive/behavioral .    Objective: Vital Signs: Blood pressure 100/65, pulse 100, temperature 98 F (36.7 C), temperature source Oral, resp. rate 18, height 6' (1.829 m), weight 70 kg (154 lb 5.2 oz), SpO2 100 %. No results found. No results for input(s): WBC, HGB, HCT, PLT in the last 72 hours. No results for input(s): NA, K, CL, GLUCOSE, BUN, CREATININE, CALCIUM in the last 72 hours.  Invalid input(s): CO CBG (last 3)  No results for input(s): GLUCAP in the last 72 hours.  Wt Readings from Last 3 Encounters:  06/13/17 70 kg (154 lb 5.2 oz)  06/02/17 69.8 kg (153 lb 14.1 oz)    Physical Exam:  Constitutional: NAD. Vital signs reviewed. HENT: Depressed areas mid forehead-stable  Eyes: Eyes open.  No areas of drainage or irritation Cardiovascular: RRR without murmur. No JVD   Respiratory: CTA Bilaterally without wheezes or rales. Normal effort  GI: Bowel sounds are normal. He exhibits no distension.  Musculoskeletal: He exhibits no edema or tenderness in all extremities.  Limited movement right upper extremity Neurological:  alert this morning.  Speech was clear.  Continued language of confusion and labile behavior  skin: Warm and dry. Intact. Psychiatric: Confused and restless  Assessment/Plan: 1.  Visual and cognitive deficits as well as balance problems secondary to polytrauma and TBI which require 3+ hours per day of interdisciplinary therapy in a comprehensive inpatient rehab setting. Physiatrist is providing close team supervision and 24 hour management of active medical problems listed below. Physiatrist and rehab team continue to assess barriers to discharge/monitor patient progress toward functional and  medical goals.  Function:  Bathing Bathing position   Position: Sitting EOB  Bathing parts Body parts bathed by patient: Right upper leg, Left upper leg Body parts bathed by helper: Right arm, Left arm, Chest, Abdomen, Right lower leg, Left lower leg, Back  Bathing assist Assist Level: Touching or steadying assistance(Pt > 75%)(pt refused perineal care )      Upper Body Dressing/Undressing Upper body dressing   What is the patient wearing?: Hospital gown       Pull over shirt/dress - Perfomed by helper: Thread/unthread right sleeve, Thread/unthread left sleeve, Put head through opening, Pull shirt over trunk        Upper body assist Assist Level: Touching or steadying assistance(Pt > 75%)      Lower Body Dressing/Undressing Lower body dressing   What is the patient wearing?: Non-skid slipper socks, Ted Hose       Pants- Performed by helper: Thread/unthread right pants leg, Thread/unthread left pants leg, Pull pants up/down   Non-skid slipper socks- Performed by helper: Don/doff right sock, Don/doff left sock               TED Hose - Performed by helper: Don/doff right TED hose, Don/doff left TED hose  Lower body assist        Toileting Toileting Toileting activity did not occur: No continent bowel/bladder event Toileting steps completed by patient: Adjust clothing prior to toileting Toileting steps completed by helper: Adjust clothing prior to toileting, Performs perineal hygiene, Adjust clothing after toileting Toileting Assistive Devices: Grab bar or rail  Toileting assist Assist level: Touching or steadying  assistance (Pt.75%)   Transfers Chair/bed transfer Chair/bed transfer activity did not occur: Safety/medical concerns Chair/bed transfer method: Ambulatory Chair/bed transfer assist level: Touching or steadying assistance (Pt > 75%) Chair/bed transfer assistive device: (HHA)     Locomotion Ambulation     Max distance: 200 ft Assist level: Touching  or steadying assistance (Pt > 75%)   Wheelchair Wheelchair activity did not occur: Safety/medical concerns        Cognition Comprehension Comprehension assist level: Understands basic less than 25% of the time/ requires cueing >75% of the time  Expression Expression assist level: Expresses basis less than 25% of the time/requires cueing >75% of the time.  Social Interaction Social Interaction assist level: Interacts appropriately less than 25% of the time. May be withdrawn or combative.  Problem Solving Problem solving assist level: Solves basic less than 25% of the time - needs direction nearly all the time or does not effectively solve problems and may need a restraint for safety  Memory Memory assist level: Recognizes or recalls less than 25% of the time/requires cueing greater than 75% of the time   Medical Problem List and Plan: 1.  Visual deficits, cognitive deficits with inappropriate behaviors, balance deficits affecting ability to carry out ADL tasks and mobility secondary to polytrauma with TBI.   Continue CIR   Ongoing cognitive and behavioral deficits 2.  DVT Prophylaxis/Anticoagulation: Pharmaceutical: Lovenox   Vascular study negative for DVT 3. Pain Management: Hold oxycodone to improve daytime arousal.  Use tramadol for breakthrough pain or tylenol prn depending of pain intensity. 4. Mood: Team to provide ego support to continue help manage anxiety. LCSW to follow for evaluation and support when appropriate.    -Music therapy  5. Neuropsych: This patient is not capable of making decisions on his ownbehalf.   -Continue Ritalin  10 mg for attention and focus 6. Skin/Wound Care: Pressure relief measures 7. Fluids/Electrolytes/Nutrition: I/Os 8. Mild hyponatremia:    Sodium up to 142 . 9.  Leucocytosis: Resolved   WBCs decreased to 5.8   10. Agitation/emotional dyscontrol: Continue scheduled valproic acid, increased to 500 mg 3 times daily    -Check valproic acid level on  Tuesday   -Continue Seroquel at night for sleep.  Added Seroquel during the day to help with psychotic behavior and agitation--- changed to Risperdal on 1/20, music therapy---?some improvement    -sleep wake chart   -Continue enclosure bed for safety 11. H/o polysubstance abuse: Educate on cessation when appropriate.    LOS (Days) 12 A FACE TO FACE EVALUATION WAS PERFORMED  Ranelle Oyster, MD 06/18/2017 9:02 AM

## 2017-06-19 ENCOUNTER — Encounter (HOSPITAL_COMMUNITY): Payer: Self-pay | Admitting: Emergency Medicine

## 2017-06-19 ENCOUNTER — Inpatient Hospital Stay (HOSPITAL_COMMUNITY): Payer: No Typology Code available for payment source | Admitting: Physical Therapy

## 2017-06-19 ENCOUNTER — Inpatient Hospital Stay (HOSPITAL_COMMUNITY): Payer: No Typology Code available for payment source | Admitting: Speech Pathology

## 2017-06-19 ENCOUNTER — Inpatient Hospital Stay (HOSPITAL_COMMUNITY): Payer: Self-pay | Admitting: Occupational Therapy

## 2017-06-19 LAB — VALPROIC ACID LEVEL: Valproic Acid Lvl: 82 ug/mL (ref 50.0–100.0)

## 2017-06-19 LAB — BASIC METABOLIC PANEL
ANION GAP: 11 (ref 5–15)
BUN: 16 mg/dL (ref 6–20)
CHLORIDE: 105 mmol/L (ref 101–111)
CO2: 23 mmol/L (ref 22–32)
Calcium: 9 mg/dL (ref 8.9–10.3)
Creatinine, Ser: 0.86 mg/dL (ref 0.61–1.24)
GFR calc Af Amer: >60 mL/min (ref >60)
GLUCOSE: 98 mg/dL (ref 65–99)
Potassium: 4.2 mmol/L (ref 3.5–5.1)
Sodium: 139 mmol/L (ref 135–145)

## 2017-06-19 LAB — CBC
HEMATOCRIT: 39.2 % (ref 39.0–52.0)
HEMOGLOBIN: 12.7 g/dL — AB (ref 13.0–17.0)
MCH: 28.5 pg (ref 26.0–34.0)
MCHC: 32.4 g/dL (ref 30.0–36.0)
MCV: 87.9 fL (ref 78.0–100.0)
Platelets: 311 10*3/uL (ref 150–400)
RBC: 4.46 MIL/uL (ref 4.22–5.81)
RDW: 13.4 % (ref 11.5–15.5)
WBC: 5.5 10*3/uL (ref 4.0–10.5)

## 2017-06-19 MED ORDER — RISPERIDONE 0.5 MG PO TABS
0.5000 mg | ORAL_TABLET | Freq: Two times a day (BID) | ORAL | Status: DC
  Administered 2017-06-20 – 2017-07-02 (×25): 0.5 mg via ORAL
  Filled 2017-06-19 (×26): qty 1

## 2017-06-19 NOTE — Progress Notes (Signed)
[facility] PHYSICAL MEDICINE & REHABILITATION     PROGRESS NOTE    Subjective/Complaints: Had a fairly uneventful night.  Still restless at times  ROS: Limited due to cognitive/behavioral    Objective: Vital Signs: Blood pressure 100/63, pulse 88, temperature 98 F (36.7 C), temperature source Oral, resp. rate 19, height 6' (1.829 m), weight 70 kg (154 lb 5.2 oz), SpO2 98 %. No results found. Recent Labs    06/19/17 0553  WBC 5.5  HGB 12.7*  HCT 39.2  PLT 311   Recent Labs    06/19/17 0553  NA 139  K 4.2  CL 105  GLUCOSE 98  BUN 16  CREATININE 0.86  CALCIUM 9.0   CBG (last 3)  No results for input(s): GLUCAP in the last 72 hours.  Wt Readings from Last 3 Encounters:  06/13/17 70 kg (154 lb 5.2 oz)  06/02/17 69.8 kg (153 lb 14.1 oz)    Physical Exam:  Constitutional: NAD. Vital signs reviewed. HENT: Depressed areas mid forehead-stable  Eyes: Eyes open.  No areas of drainage or irritation Cardiovascular: RRR without murmur. No JVD    Respiratory: CTA Bilaterally without wheezes or rales. Normal effort  GI: Bowel sounds are normal. He exhibits no distension.  Musculoskeletal: He exhibits no edema or tenderness in all extremities.  Limited movement right upper extremity Neurological: Shows flashes of attention and awareness.  Often reverts into language of confusion quickly. Skin: Warm and dry. Intact. Psychiatric: Confused  Assessment/Plan: 1.  Visual and cognitive deficits as well as balance problems secondary to polytrauma and TBI which require 3+ hours per day of interdisciplinary therapy in a comprehensive inpatient rehab setting. Physiatrist is providing close team supervision and 24 hour management of active medical problems listed below. Physiatrist and rehab team continue to assess barriers to discharge/monitor patient progress toward functional and medical goals.  Function:  Bathing Bathing position   Position: Shower  Bathing parts Body parts  bathed by patient: Right arm, Left arm, Chest, Abdomen, Front perineal area, Right upper leg, Left upper leg Body parts bathed by helper: Buttocks, Right lower leg, Left lower leg, Back  Bathing assist Assist Level: Touching or steadying assistance(Pt > 75%)(pt refused perineal care )      Upper Body Dressing/Undressing Upper body dressing   What is the patient wearing?: Pull over shirt/dress     Pull over shirt/dress - Perfomed by patient: Thread/unthread left sleeve Pull over shirt/dress - Perfomed by helper: Thread/unthread right sleeve, Put head through opening, Pull shirt over trunk        Upper body assist Assist Level: Touching or steadying assistance(Pt > 75%)      Lower Body Dressing/Undressing Lower body dressing   What is the patient wearing?: Non-skid slipper socks, Ted Hose       Pants- Performed by helper: Thread/unthread right pants leg, Thread/unthread left pants leg, Pull pants up/down   Non-skid slipper socks- Performed by helper: Don/doff right sock, Don/doff left sock               TED Hose - Performed by helper: Don/doff right TED hose, Don/doff left TED hose  Lower body assist        Toileting Toileting Toileting activity did not occur: No continent bowel/bladder event Toileting steps completed by patient: Adjust clothing prior to toileting Toileting steps completed by helper: Adjust clothing prior to toileting, Performs perineal hygiene, Adjust clothing after toileting Toileting Assistive Devices: Grab bar or rail  Toileting assist Assist level: Touching  or steadying assistance (Pt.75%)   Transfers Chair/bed transfer Chair/bed transfer activity did not occur: Safety/medical concerns Chair/bed transfer method: Stand pivot Chair/bed transfer assist level: Touching or steadying assistance (Pt > 75%) Chair/bed transfer assistive device: Armrests(HHA)     Locomotion Ambulation     Max distance: 100 ft Assist level: Touching or steadying  assistance (Pt > 75%)   Wheelchair Wheelchair activity did not occur: Safety/medical concerns        Cognition Comprehension Comprehension assist level: Understands basic less than 25% of the time/ requires cueing >75% of the time  Expression Expression assist level: Expresses basis less than 25% of the time/requires cueing >75% of the time.  Social Interaction Social Interaction assist level: Interacts appropriately less than 25% of the time. May be withdrawn or combative.  Problem Solving Problem solving assist level: Solves basic less than 25% of the time - needs direction nearly all the time or does not effectively solve problems and may need a restraint for safety  Memory Memory assist level: Recognizes or recalls less than 25% of the time/requires cueing greater than 75% of the time   Medical Problem List and Plan: 1.  Visual deficits, cognitive deficits with inappropriate behaviors, balance deficits affecting ability to carry out ADL tasks and mobility secondary to polytrauma with TBI.   Continue CIR   Ongoing cognitive and behavioral deficits 2.  DVT Prophylaxis/Anticoagulation: Pharmaceutical: Lovenox   Vascular study negative for DVT 3. Pain Management: Hold oxycodone to improve daytime arousal.  Use tramadol for breakthrough pain or tylenol prn depending of pain intensity. 4. Mood: Team to provide ego support to continue help manage anxiety. LCSW to follow for evaluation and support when appropriate.    -Music therapy helpful 5. Neuropsych: This patient is not capable of making decisions on his ownbehalf.   -Continue Ritalin  10 mg for attention and focus 6. Skin/Wound Care: Pressure relief measures 7. Fluids/Electrolytes/Nutrition: I/Os 8. Mild hyponatremia:    Sodium 139 on 06/19/2017. 9.  Leucocytosis: Resolved   WBCs decreased to 5.5 on 06/19/2017 10. Agitation/emotional dyscontrol: Continue scheduled valproic acid, increased to 500 mg 3 times daily    -Check valproic acid  level 82 today. -Continue Seroquel at night for sleep.  Added Seroquel during the day to help with psychotic behavior and agitation--- changed to Risperdal on 1/20, music therapy---?some improvement    -sleep wake chart   -Continue enclosure bed for safety which is still required for safety 11. H/o polysubstance abuse: Educate on cessation when appropriate.    LOS (Days) 13 A FACE TO FACE EVALUATION WAS PERFORMED  Ranelle Oyster, MD 06/19/2017 9:16 AM

## 2017-06-19 NOTE — Progress Notes (Signed)
Speech Language Pathology Daily Session Note  Patient Details  Name: Patrick [name] MRN: [id number] Date of Birth: January 24, 1966  Today's Date: 06/19/2017 SLP Individual Time: [id number] SLP Individual Time Calculation (min): 25 min  Short Term Goals: Week 2: SLP Short Term Goal 1 (Week 2): Patient will demonstrate sustained attention to a task for 60 seconds with Max A verbal cues for redirection.  SLP Short Term Goal 2 (Week 2): Patient will initiate a functional task with Max A multimodal cues in 50% of opportunities  SLP Short Term Goal 3 (Week 2): Patient will orient to person, place and situation with Max A multimodal cues.   Skilled Therapeutic Interventions: Skilled treatment session focused on cognitive goals. SLP facilitated session by providing extra time and Max A multimodal cues for patient to sit EOB in order to consume his breakfast. Despite multiple attempts and Max A multimodal cues, patient would not sit EOB. Patient perseverative on feeling sick but unable to elaborate. RN aware. Patient left supine in enclosure bed. Continue with current plan of care.      Function:   Cognition Comprehension Comprehension assist level: Understands basic less than 25% of the time/ requires cueing >75% of the time  Expression   Expression assist level: Expresses basis less than 25% of the time/requires cueing >75% of the time.  Social Interaction Social Interaction assist level: Interacts appropriately less than 25% of the time. May be withdrawn or combative.  Problem Solving Problem solving assist level: Solves basic less than 25% of the time - needs direction nearly all the time or does not effectively solve problems and may need a restraint for safety  Memory Memory assist level: Recognizes or recalls less than 25% of the time/requires cueing greater than 75% of the time    Pain Patient reports he is "sick" but unable to elaborate   Therapy/Group: Individual Therapy  Yi Falletta,  Tericka Devincenzi 06/19/2017, 3:48 PM

## 2017-06-19 NOTE — Progress Notes (Signed)
Occupational Therapy Session Note  Patient Details  Name: Capri Raben MRN: [id number] Date of Birth: May 29, 1966  Today's Date: 06/19/2017 OT Individual Time: 1115-1200 OT Individual Time Calculation (min): 45 min   Short Term Goals: Week 2:  OT Short Term Goal 1 (Week 2): Pt will initiate bathing 1 area of body during shower  OT Short Term Goal 2 (Week 2): Pt will attend to UB dressing task for 10 seconds to complete at least 1/4 components  OT Short Term Goal 3 (Week 2): Pt will be A&Ox1 consistently during BADL sessions  Skilled Therapeutic Interventions/Progress Updates:    Pt greeted in enclosure bed asleep. OT woke pt and he continued to refuse to get up stating he was cold and wanted to die. Utilized music therapy and therapeutic use of self to ease mood and encourage pt to participate. Manually advanced LE's out of bed, then pt elevated trunk to sit EOB. Pt stated "I'm going to piss all over myself" so OT acquired urinal, pt stood with min A and was able to place urinal himself while OT provided steady A for balance. Pt agreed to don pants after urinating with mod A and max multimodal cues 2/2 attention deficits. Pt's sling donned at EOB, then pt completed stand-pivot to wc with min A. Pt stated he was hungry and ate remainder of breakfast seated in wc with set-up A. Pt left seated up at nurses station with needs met.   Therapy Documentation Precautions:  Precautions Precautions: Fall Precaution Comments: TBI/ do not push on center of forehead / depressed skull fx  Required Braces or Orthoses: Sling(RUE) Restrictions Weight Bearing Restrictions: Yes RUE Weight Bearing: Non weight bearing RLE Weight Bearing: Weight bearing as tolerated  See Function Navigator for Current Functional Status.  Therapy/Group: Individual Therapy  Valma Cava 06/19/2017, 11:47 AM
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
## 2017-06-19 NOTE — Progress Notes (Signed)
Occupational Therapy Session Note  Patient Details  Name: Patrick [name] MRN: [id number] Date of Birth: [birth year]  Today's Date: 06/19/2017 OT Individual Time: [id number] OT Individual Time Calculation (min): 15 min    Short Term Goals: Week 2:  OT Short Term Goal 1 (Week 2): Pt will initiate bathing 1 area of body during shower  OT Short Term Goal 2 (Week 2): Pt will attend to UB dressing task for 10 seconds to complete at least 1/4 components  OT Short Term Goal 3 (Week 2): Pt will be A&Ox1 consistently during BADL sessions  Skilled Therapeutic Interventions/Progress Updates:    Pt presented supine in bed, reporting not feeling well and declining participation in therapy session despite max encouragement and multiple attempts (attempted use of warm washcloth for further comfort due to not feeling well, offer to eat breakfast, bathroom, etc.). Pt continuing to decline, pulling blankets over head and resistant to movements. Pt left resting comfortably in enclosure bed with needs in reach.   Pt missed 45 min skilled OT tx due to unwilling/refusing to participate.   Therapy Documentation Precautions:  Precautions Precautions: Fall Precaution Comments: TBI/ do not push on center of forehead / depressed skull fx  Required Braces or Orthoses: Sling(RUE) Restrictions Weight Bearing Restrictions: Yes RUE Weight Bearing: Non weight bearing RLE Weight Bearing: Weight bearing as tolerated General: General OT Amount of Missed Time: 45 Minutes  See Function Navigator for Current Functional Status.   Therapy/Group: Individual Therapy  [name] 06/19/2017, 2:02 PM

## 2017-06-20 ENCOUNTER — Inpatient Hospital Stay (HOSPITAL_COMMUNITY): Payer: No Typology Code available for payment source | Admitting: Physical Therapy

## 2017-06-20 ENCOUNTER — Inpatient Hospital Stay (HOSPITAL_COMMUNITY): Payer: Self-pay | Admitting: Physical Therapy

## 2017-06-20 ENCOUNTER — Inpatient Hospital Stay (HOSPITAL_COMMUNITY): Payer: No Typology Code available for payment source | Admitting: Occupational Therapy

## 2017-06-20 ENCOUNTER — Inpatient Hospital Stay (HOSPITAL_COMMUNITY): Payer: No Typology Code available for payment source | Admitting: Speech Pathology

## 2017-06-20 MED ORDER — NICOTINE 14 MG/24HR TD PT24
14.0000 mg | MEDICATED_PATCH | Freq: Every day | TRANSDERMAL | Status: DC
  Administered 2017-06-20 – 2017-07-06 (×17): 14 mg via TRANSDERMAL
  Filled 2017-06-20 (×17): qty 1

## 2017-06-20 MED ORDER — METHYLPHENIDATE HCL 5 MG PO TABS
15.0000 mg | ORAL_TABLET | Freq: Two times a day (BID) | ORAL | Status: DC
  Administered 2017-06-20 – 2017-07-06 (×32): 15 mg via ORAL
  Filled 2017-06-20 (×32): qty 3

## 2017-06-20 NOTE — Progress Notes (Signed)
Social Work Patient ID: Patrick [name], male   DOB: 1966/05/29, 52 y.o.   MRN: [id number]  Able to meet this afternoon with pt and daughter, Patrick [name], to review team conference information.  Pt sitting calmly in his w/c and appears to be attending to our conversation.  He states he is aware that team is recommending 2-3 weeks further on CIR.  He tells daughter he is agreeable to staying in program.  Daughter pleased with pt's presentation today and feels he is making some progress.  She was able to get SSD application completed today.  We discussed d/c plan which she states is "definite he's coming home with me."  She states she understands he will continue to require 24/7 supervision.  We discussed concerns with impaired attention and behavioral issues with hopes that this will continue to improve. We discussed use of enclosure bed and behaviors we watch for in readiness to move to regular bed.  Pt was calm the entire time we discussed his care and d/c needs.  Looking down most of the time but nodding in agreement with daughter to her questions.  Will continue to follow for d/c planning support and making sure plans remain secured.  Jeff Mccallum, LCSW

## 2017-06-20 NOTE — Progress Notes (Signed)
Occupational Therapy Session Note  Patient Details  Name: Patrick [name] [name] MRN: [id number] Date of Birth: 12-18-1965  Today's Date: 06/20/2017 OT Individual Time: 1100-1140 OT Individual Time Calculation (min): 40 min    Short Term Goals: Week 2:  OT Short Term Goal 1 (Week 2): Pt will initiate bathing 1 area of body during shower  OT Short Term Goal 2 (Week 2): Pt will attend to UB dressing task for 10 seconds to complete at least 1/4 components  OT Short Term Goal 3 (Week 2): Pt will be A&Ox1 consistently during BADL sessions  Skilled Therapeutic Interventions/Progress Updates:   Pt received at RN station. Pt tearful as soon as he saw therapist approach. Pt assisted back to room via wheelchair. Pt reports, " I'm hungry." OT giving pt snack from room, little debbie cake, and pt ate without cues to do so and feeding self with L hand. Pt also sitting down food to drink from cup with min cues. Pt becoming tearful and therapist attempting to redirect pt with max multimodal cues. Pt initiating stand from wheelchair with min HHA and ambulating 10' to enclosure bed. Pt returning to supine and pulling covers over head. Pt declined further OT intervention. Music turned on in room to calm pt and bed secured upon exiting the room.   Therapy Documentation Precautions:  Precautions Precautions: Fall Precaution Comments: TBI/ do not push on center of forehead / depressed skull fx  Required Braces or Orthoses: Sling(RUE) Restrictions Weight Bearing Restrictions: Yes RUE Weight Bearing: Non weight bearing RLE Weight Bearing: Weight bearing as tolerated General: General OT Amount of Missed Time: 20 Minutes  See Function Navigator for Current Functional Status.   Therapy/Group: Individual Therapy  Alen BleacherBradsher, Jamilah Jean P 06/20/2017, 12:31 PM
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
## 2017-06-20 NOTE — Progress Notes (Deleted)
Patient arrived on unit alert and responsive with his wife at about 2000. No s/s of distress noted.

## 2017-06-20 NOTE — Progress Notes (Signed)
Physical Therapy Weekly Progress Note  Patient Details  Name: Patrick [name] MRN: [id number] Date of Birth: 05-Sep-1965  Beginning of progress report period: June 14, 2017 End of progress report period: June 21, 2017  Today's Date: 06/21/2017  Patient has met 1 of 3 short term goals.  Pt is making slow progress towards all LTG's. Pt continues to be limited by significantly impaired cognition (attention to task <5-30 seconds). Pt is only consistently AxO to self, requiring max<>total assist for orientation to location, situation, date. Pt continues to require min assist overall for transfers and gait with HHA up to 200 ft. It has been difficult to progress pt in regards to functional mobility 2/2 cognitive deficits. Pt would benefit from continued skilled PT treatment to focus on cognitive remediation, balance, transfers, gait, stair negotiation, and for family education prior to d/c.   Patient continues to demonstrate the following deficits muscle weakness, decreased cardiorespiratory endurance, decreased coordination, decreased initiation, decreased attention, decreased awareness, decreased problem solving, decreased safety awareness, decreased memory and delayed processing, and decreased standing balance, decreased postural control, decreased balance strategies and difficulty maintaining precautions and therefore will continue to benefit from skilled PT intervention to increase functional independence with mobility.  Patient is making slow progress towards LTG's.  Continue plan of care.  PT Short Term Goals Week 2:  PT Short Term Goal 1 (Week 2): Pt will ambulate 75 ft with LRAD & min assist PT Short Term Goal 1 - Progress (Week 2): Met PT Short Term Goal 2 (Week 2): Pt will initiate stair training. PT Short Term Goal 2 - Progress (Week 2): Not met PT Short Term Goal 3 (Week 2): Pt will perform car transfer with min assist and LRAD. PT Short Term Goal 3 - Progress (Week 2): Not  met Week 3:  PT Short Term Goal 1 (Week 3): Pt will ambulate 25 ft with LRAD & supervision. PT Short Term Goal 2 (Week 3): Pt will initiate stair training. PT Short Term Goal 3 (Week 3): Pt will perform car transfer with min assist and LRAD.   Therapy Documentation Precautions:  Precautions Precautions: Fall Precaution Comments: TBI/ do not push on center of forehead / depressed skull fx  Required Braces or Orthoses: Sling(RUE) Restrictions Weight Bearing Restrictions: Yes RUE Weight Bearing: Non weight bearing RLE Weight Bearing: Weight bearing as tolerated   See Function Navigator for Current Functional Status.  Therapy/Group: Individual Therapy  [name] 06/21/2017, 8:08 AM

## 2017-06-20 NOTE — Progress Notes (Signed)
Speech Language Pathology Daily Session Note  Patient Details  Name: Patrick [name] MRN: [id number] Date of Birth: 1965/08/10  Today's Date: 06/20/2017 SLP Individual Time: 0930-1000 SLP Individual Time Calculation (min): 30 min  Short Term Goals: Week 2: SLP Short Term Goal 1 (Week 2): Patient will demonstrate sustained attention to a task for 60 seconds with Max A verbal cues for redirection.  SLP Short Term Goal 2 (Week 2): Patient will initiate a functional task with Max A multimodal cues in 50% of opportunities  SLP Short Term Goal 3 (Week 2): Patient will orient to person, place and situation with Max A multimodal cues.   Skilled Therapeutic Interventions: Skilled treatment session focused on cognitive goals. Patient lethargic throughout session and perseverative on feeling "sick." Patient demonstrated focused attention for ~15-30 seconds and required total A to complete and functional task. Patient left at RN station with quick release belt in place. Continue with current plan of care.      Function:  Eating Eating   Modified Consistency Diet: No Eating Assist Level: Supervision or verbal cues;Set up assist for   Eating Set Up Assist For: Opening containers       Cognition Comprehension Comprehension assist level: Understands basic less than 25% of the time/ requires cueing >75% of the time  Expression   Expression assist level: Expresses basis less than 25% of the time/requires cueing >75% of the time.  Social Interaction Social Interaction assist level: Interacts appropriately less than 25% of the time. May be withdrawn or combative.  Problem Solving Problem solving assist level: Solves basic less than 25% of the time - needs direction nearly all the time or does not effectively solve problems and may need a restraint for safety  Memory Memory assist level: Recognizes or recalls less than 25% of the time/requires cueing greater than 75% of the time     Pain No/Denies Pain   Therapy/Group: Individual Therapy  Theron Cumbie 06/20/2017, 4:17 PM

## 2017-06-20 NOTE — Progress Notes (Signed)
Physical Therapy Note  Patient Details  Name: Patrick [name] Dean [name] MRN: [id number] Date of Birth: 19-Dec-1965 Today's Date: 06/20/2017    Pt missed 30 minutes of skilled PT due to meeting with representative from disability.     Stephania FragminCaitlin E Clemma Johnsen 06/20/2017, 2:17 PM

## 2017-06-20 NOTE — Progress Notes (Signed)
Physical Therapy Session Note  Patient Details  Name: Patrick [name] MRN: [id number] Date of Birth: May 07, 1966  Today's Date: 06/20/2017 PT Individual Time: [id number] and [id number] PT Individual Time Calculation (min): 57 min and 56 min  Short Term Goals: Week 2:  PT Short Term Goal 1 (Week 2): Pt will ambulate 75 ft with LRAD & min assist. PT Short Term Goal 2 (Week 2): Pt will initiate stair training. PT Short Term Goal 3 (Week 2): Pt will perform car transfer with min assist and LRAD.  Skilled Therapeutic Interventions/Progress Updates:  Treatment 1: Pt received in w/c eating breakfast with NT. Therapist then supervised pt consuming remainder of breakfast; pt required max cuing for attention to task to finish breakfast. Pt very tearful throughout session despite attempts at redirection. Pt required total assist to don scrub pants, ted hose, and socks 2/2 poor attention to task. Transported pt to gym via w/c total assist for time management & attempted to have pt initiate stair training. After multiple attempts pt stood but transferred to sitting on mat table instead. Pt repeating "I'm sick. I'm tired." and unable to to be redirected. After significantly extra time pt transferred back to w/c and left sitting at nurses station in w/c with QRB donned.   Pt reported "I'm sore all over" -- RN made aware & administered meds.   Treatment 2: Pt received in w/c in room with daughter's mother Patrick [name](Karen) present. Educated Patrick BraunKaren on pt's limited progress 2/2 significant cognitive deficits, specifically his very poor sustained attention. She voiced "I feel like when he gets home and out of these walls he'll be okay" and therapist continued to provide education on injury and limited progress. Transported pt to dayroom where pt completed squat pivot w/c<>nu-step with supervision. Pt utilized nu-step on level 1 x 10 minutes with BLE only and MAX cuing to attend to task; activity focused on sustained  attention (max of 30 seconds) and activity tolerance. When pivoting back to w/c pt with very poor eccentric control and hit RUE on armrest and reported significant pain in extremity; pt's RN off floor but notified another RN at end of session -- pt without c/o pain remainder of session. In gym pt engaged in peg board task, correctly assembling very simple designs with MAX cuing for error correction and problem solving but pt with improved sustained attention to task. Pt returned to room and left sitting in w/c with QRB & chair alarm donned with Patrick BraunKaren present to supervise. Requested Patrick BraunKaren notify nursing staff when she leaves & NT made aware.    Therapy Documentation Precautions:  Precautions Precautions: Fall Precaution Comments: TBI/ do not push on center of forehead / depressed skull fx  Required Braces or Orthoses: Sling(RUE) Restrictions Weight Bearing Restrictions: Yes RUE Weight Bearing: Non weight bearing RLE Weight Bearing: Weight bearing as tolerated    See Function Navigator for Current Functional Status.   Therapy/Group: Individual Therapy  Sandi MariscalVictoria M Avyn Aden 06/20/2017, 4:42 PM
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
## 2017-06-21 ENCOUNTER — Inpatient Hospital Stay (HOSPITAL_COMMUNITY): Payer: No Typology Code available for payment source | Admitting: Speech Pathology

## 2017-06-21 ENCOUNTER — Inpatient Hospital Stay (HOSPITAL_COMMUNITY): Payer: Self-pay | Admitting: Physical Therapy

## 2017-06-21 ENCOUNTER — Inpatient Hospital Stay (HOSPITAL_COMMUNITY): Payer: Self-pay | Admitting: Occupational Therapy

## 2017-06-21 ENCOUNTER — Inpatient Hospital Stay (HOSPITAL_COMMUNITY): Payer: No Typology Code available for payment source | Admitting: Occupational Therapy

## 2017-06-21 ENCOUNTER — Inpatient Hospital Stay (HOSPITAL_COMMUNITY): Payer: Self-pay | Admitting: Speech Pathology

## 2017-06-21 ENCOUNTER — Inpatient Hospital Stay (HOSPITAL_COMMUNITY): Payer: No Typology Code available for payment source | Admitting: Physical Therapy
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
## 2017-06-21 NOTE — Progress Notes (Signed)
[place] PHYSICAL MEDICINE & REHABILITATION     PROGRESS NOTE    Subjective/Complaints: No new issues. Slept somewhat  ROS: pt denies nausea, vomiting, diarrhea, cough, shortness of breath or chest pain   Objective: Vital Signs: Blood pressure 111/80, pulse 86, temperature 97.7 F (36.5 C), temperature source Oral, resp. rate 18, height 6' (1.829 m), weight 70 kg (154 lb 5.2 oz), SpO2 98 %. No results found. Recent Labs    06/19/17 0553  WBC 5.5  HGB 12.7*  HCT 39.2  PLT 311   Recent Labs    06/19/17 0553  NA 139  K 4.2  CL 105  GLUCOSE 98  BUN 16  CREATININE 0.86  CALCIUM 9.0   CBG (last 3)  No results for input(s): GLUCAP in the last 72 hours.  Wt Readings from Last 3 Encounters:  06/13/17 70 kg (154 lb 5.2 oz)  06/02/17 69.8 kg (153 lb 14.1 oz)    Physical Exam:  Constitutional: NAD. Vital signs reviewed. HENT: Depressed areas mid forehead-stable  Eyes: Eyes open.  No areas of drainage or irritation Cardiovascular: RRR without murmur. No JVD     Respiratory: CTA Bilaterally without wheezes or rales. Normal effort   GI: Bowel sounds are normal. He exhibits no distension.  Musculoskeletal: He exhibits no edema or tenderness in all extremities.  Limited movement right upper extremity Neurological: Shows flashes of attention and awareness.  Often reverts into language of confusion quickly. Skin: Warm and dry. Intact. Psychiatric: restless  Assessment/Plan: 1.  Visual and cognitive deficits as well as balance problems secondary to polytrauma and TBI which require 3+ hours per day of interdisciplinary therapy in a comprehensive inpatient rehab setting. Physiatrist is providing close team supervision and 24 hour management of active medical problems listed below. Physiatrist and rehab team continue to assess barriers to discharge/monitor patient progress toward functional and medical goals.  Function:  Bathing Bathing position   Position: Shower  Bathing  parts Body parts bathed by patient: Right arm, Left arm, Chest, Abdomen, Front perineal area, Right upper leg, Left upper leg Body parts bathed by helper: Buttocks, Right lower leg, Left lower leg, Back  Bathing assist Assist Level: Touching or steadying assistance(Pt > 75%)(pt refused perineal care )      Upper Body Dressing/Undressing Upper body dressing   What is the patient wearing?: Pull over shirt/dress     Pull over shirt/dress - Perfomed by patient: Thread/unthread left sleeve Pull over shirt/dress - Perfomed by helper: Thread/unthread right sleeve, Put head through opening, Pull shirt over trunk        Upper body assist Assist Level: Touching or steadying assistance(Pt > 75%)      Lower Body Dressing/Undressing Lower body dressing   What is the patient wearing?: Non-skid slipper socks, Ted Hose       Pants- Performed by helper: Thread/unthread right pants leg, Thread/unthread left pants leg, Pull pants up/down   Non-skid slipper socks- Performed by helper: Don/doff right sock, Don/doff left sock               TED Hose - Performed by helper: Don/doff right TED hose, Don/doff left TED hose  Lower body assist        Toileting Toileting Toileting activity did not occur: No continent bowel/bladder event Toileting steps completed by patient: Adjust clothing prior to toileting, Adjust clothing after toileting Toileting steps completed by helper: Performs perineal hygiene Toileting Assistive Devices: Grab bar or rail  Toileting assist Assist level: Touching or  steadying assistance (Pt.75%)   Transfers Chair/bed transfer Chair/bed transfer activity did not occur: Safety/medical concerns Chair/bed transfer method: Stand pivot Chair/bed transfer assist level: Touching or steadying assistance (Pt > 75%) Chair/bed transfer assistive device: Other(HHA)     Locomotion Ambulation     Max distance: 10' Assist level: Touching or steadying assistance (Pt > 75%)    Wheelchair Wheelchair activity did not occur: Safety/medical concerns        Cognition Comprehension Comprehension assist level: Understands basic less than 25% of the time/ requires cueing >75% of the time  Expression Expression assist level: Expresses basis less than 25% of the time/requires cueing >75% of the time.  Social Interaction Social Interaction assist level: Interacts appropriately less than 25% of the time. May be withdrawn or combative.  Problem Solving Problem solving assist level: Solves basic less than 25% of the time - needs direction nearly all the time or does not effectively solve problems and may need a restraint for safety  Memory Memory assist level: Recognizes or recalls less than 25% of the time/requires cueing greater than 75% of the time   Medical Problem List and Plan: 1.  Visual deficits, cognitive deficits with inappropriate behaviors, balance deficits affecting ability to carry out ADL tasks and mobility secondary to polytrauma with TBI.   Continue CIR   Continued cognitive/behavioral issues 2.  DVT Prophylaxis/Anticoagulation: Pharmaceutical: Lovenox   Vascular study negative for DVT 3. Pain Management: Hold oxycodone to improve daytime arousal.  Use tramadol for breakthrough pain or tylenol prn depending of pain intensity. 4. Mood: Team to provide ego support to continue help manage anxiety. LCSW to follow for evaluation and support when appropriate.    -Music therapy helpful 5. Neuropsych: This patient is not capable of making decisions on his ownbehalf.   -Continue Ritalin  10 mg for attention and focus--increase to 15 mg 6. Skin/Wound Care: Pressure relief measures 7. Fluids/Electrolytes/Nutrition: I/Os 8. Mild hyponatremia:    Sodium 139 on 06/19/2017. 9.  Leucocytosis: Resolved   WBCs decreased to 5.5 on 06/19/2017 10. Agitation/emotional dyscontrol: Continue scheduled valproic acid, increased to 500 mg 3 times daily    -  valproic acid level  82 . -Continue Seroquel at night for sleep.     -continue Risperdal increased  0.5 mg twice daily during the day to help with emotional lability     - music therapy     -sleep wake chart   -Continue enclosure bed for safety which is still required for safety 11. H/o polysubstance abuse: Educate on cessation when appropriate.    LOS (Days) 15 A FACE TO FACE EVALUATION WAS PERFORMED  Ranelle Oyster, MD 06/21/2017 9:07 AM

## 2017-06-21 NOTE — Progress Notes (Signed)
Physical Therapy Session Note  Patient Details  Name: Patrick [name] [name] MRN: [id number] Date of Birth: Mar 04, 1966  Today's Date: 06/21/2017 PT Individual Time: 0802-0900 PT Individual Time Calculation (min): 58 min   Short Term Goals: Week 3:  PT Short Term Goal 1 (Week 3): Pt will ambulate 25 ft with LRAD & supervision. PT Short Term Goal 2 (Week 3): Pt will initiate stair training. PT Short Term Goal 3 (Week 3): Pt will perform car transfer with min assist and LRAD.  Skilled Therapeutic Interventions/Progress Updates:  Pt received in handoff from SLP. Session focused on cognitive remediation, transfers, and functional mobility. Therapist oriented pt to situation, date, time through conversation. Pt unable to recall therapist's name, nor name of daughter's mother who visited him yesterday. In gym pt negotiated 12 steps with L rail and min assist; therapist provided max cuing for compensatory pattern as pt with increased RLE pain when descending stairs. Attempted to have pt engage in dynavision and peg board activities but pt frequently falling asleep despite max cuing and use of music therapy for increased engagement. At end of session pt ambulated w/c>bed with min assist. Pt left supine in enclosure bed with all needs within reach.  Therapy Documentation Precautions:  Precautions Precautions: Fall Precaution Comments: TBI/ do not push on center of forehead / depressed skull fx  Required Braces or Orthoses: Sling(RUE) Restrictions Weight Bearing Restrictions: Yes RUE Weight Bearing: Non weight bearing RLE Weight Bearing: Weight bearing as tolerated   See Function Navigator for Current Functional Status.   Therapy/Group: Individual Therapy  Sandi MariscalVictoria M Tremon Sainvil 06/21/2017, 9:16 AM

## 2017-06-21 NOTE — Progress Notes (Signed)
Speech Language Pathology Weekly Progress Note  Patient Details  Name: Patrick [name] MRN: [id number] Date of Birth: December 17, 1965  Beginning of progress report period: June 14, 2017 End of progress report period: June 21, 2017  Short Term Goals: Week 2: SLP Short Term Goal 1 (Week 2): Patient will demonstrate sustained attention to a task for 60 seconds with Max A verbal cues for redirection.  SLP Short Term Goal 1 - Progress (Week 2): Not met SLP Short Term Goal 2 (Week 2): Patient will initiate a functional task with Max A multimodal cues in 50% of opportunities  SLP Short Term Goal 2 - Progress (Week 2): Not met SLP Short Term Goal 3 (Week 2): Patient will orient to person, place and situation with Max A multimodal cues.  SLP Short Term Goal 3 - Progress (Week 2): Met    New Short Term Goals: Week 3: SLP Short Term Goal 1 (Week 3): Patient will orient to person, place and situation with Max A multimodal cues.  SLP Short Term Goal 2 (Week 3): Patient will initiate a functional task with Max A multimodal cues in 50% of opportunities  SLP Short Term Goal 3 (Week 3): Patient will demonstrate focused attention to a task for 60 seconds with Max A verbal cues for redirection.   Weekly Progress Updates: Patient has made minimal and inconsistent gains this reporting period and was met 1 of 3 STGs. Currently, patient continues to require overall Max-Total A to complete functional and familiar tasks safely in regards to initiation, attention, problem solving, recall and awareness. Patient demonstrates increased orientation to place and situation intermittently but continues to demonstrate language of confusion with confabulation and perseveration. However, patient is less sexually inappropriate this reporting period. Patient and family education is ongoing. Patient would benefit from continued skilled SLP intervention to maximize his cognitive function and overall functional independence  prior to discharge.      Intensity: Minumum of 1-2 x/day, 30 to 90 minutes Frequency: 3 to 5 out of 7 days Duration/Length of Stay: 2 weeks  Treatment/Interventions: Cognitive remediation/compensation;Environmental controls;Internal/external aids;Therapeutic Activities;Patient/family education;Functional tasks;Cueing hierarchy    [name] 06/21/2017, 1:28 PM

## 2017-06-21 NOTE — Progress Notes (Signed)
Speech Language Pathology Daily Session Note  Patient Details  Name: Patrick [name] MRN: [id number] Date of Birth: [birth year]  Today's Date: 06/21/2017 SLP Individual Time: 0700-0800 SLP Individual Time Calculation (min): 60 min  Short Term Goals: Week 2: SLP Short Term Goal 1 (Week 2): Patient will demonstrate sustained attention to a task for 60 seconds with Max A verbal cues for redirection.  SLP Short Term Goal 2 (Week 2): Patient will initiate a functional task with Max A multimodal cues in 50% of opportunities  SLP Short Term Goal 3 (Week 2): Patient will orient to person, place and situation with Max A multimodal cues.   Skilled Therapeutic Interventions: Skilled treatment session focused on cognitive goals. SLP facilitated session by providing extra time and Mod A verbal and tactile cues for patient to sit EOB and transfer to wheelchair. Patient independently initiated tray set-up and demonstrated selective attention to self-feeding in a mildly distracting environment for ~20 minutes with Mod A verbal cues for redirection. Patient with decreased verbosity this session with only minimal tearfulness.  Patient also continues to require Mod-max A verbal cues for orientation to place, time and situation. Patient handed off to PT. Continue with current plan of care.      Function:  Eating Eating   Modified Consistency Diet: No Eating Assist Level: More than reasonable amount of time;Set up assist for;Supervision or verbal cues   Eating Set Up Assist For: Opening containers;Cutting food       Cognition Comprehension Comprehension assist level: Understands basic 25 - 49% of the time/ requires cueing 50 - 75% of the time  Expression   Expression assist level: Expresses basic 25 - 49% of the time/requires cueing 50 - 75% of the time. Uses single words/gestures.  Social Interaction Social Interaction assist level: Interacts appropriately less than 25% of the time. May be  withdrawn or combative.  Problem Solving Problem solving assist level: Solves basic less than 25% of the time - needs direction nearly all the time or does not effectively solve problems and may need a restraint for safety  Memory Memory assist level: Recognizes or recalls less than 25% of the time/requires cueing greater than 75% of the time    Pain No/Denies Pain   Therapy/Group: Individual Therapy  Jeanne Terrance 06/21/2017, 1:22 PM

## 2017-06-21 NOTE — Progress Notes (Signed)
Occupational Therapy Session Note  Patient Details  Name: Patrick [name] MRN: [id number] Date of Birth: [birth year]  Today's Date: 06/21/2017 OT Individual Time: [id number] OT Individual Time Calculation (min): 71 min    Short Term Goals: Week 2:  OT Short Term Goal 1 (Week 2): Pt will initiate bathing 1 area of body during shower  OT Short Term Goal 2 (Week 2): Pt will attend to UB dressing task for 10 seconds to complete at least 1/4 components  OT Short Term Goal 3 (Week 2): Pt will be A&Ox1 consistently during BADL sessions  Skilled Therapeutic Interventions/Progress Updates:    Upon entering the room, pt supine in enclosure bed. Pt taking 40 minutes to initiate standing from enclosure bed secondary to pt perseveration on calling mother and R UE pain. Pt standing with steady assistance and walking into bathroom. Pt standing initially as he refused to sit on TTB. Pt reaching inside of shower for cloth and soap to wash self and eventually pt sitting down on TTB. Pt only needing min verbal cues for sequencing and initiation once in shower. Pt perseverating on washing R UE this session and requiring min multimodal cues to redirect task. Pt donning hospital gown at end of session and seated in wheelchair. Chair alarm activated and quick release belt donned. Pt left at RN station for safety.   Therapy Documentation Precautions:  Precautions Precautions: Fall Precaution Comments: TBI/ do not push on center of forehead / depressed skull fx  Required Braces or Orthoses: Sling(RUE) Restrictions Weight Bearing Restrictions: Yes RUE Weight Bearing: Non weight bearing RLE Weight Bearing: Weight bearing as tolerated General:   Vital Signs:   Pain: Pain Assessment Pain Assessment: Faces Pain Score: Asleep Faces Pain Scale: Hurts a little bit Pain Location: Shoulder(all over) Patients Stated Pain Goal: 2 Pain Intervention(s): Medication (See eMAR) ADL:   Vision   Perception     Praxis   Exercises:   Other Treatments:    See Function Navigator for Current Functional Status.   Therapy/Group: Individual Therapy  Alen BleacherBradsher, Terrika Zuver P 06/21/2017, 10:45 AM

## 2017-06-22 ENCOUNTER — Inpatient Hospital Stay (HOSPITAL_COMMUNITY): Payer: No Typology Code available for payment source | Admitting: Speech Pathology

## 2017-06-22 ENCOUNTER — Inpatient Hospital Stay (HOSPITAL_COMMUNITY): Payer: Self-pay | Admitting: Occupational Therapy

## 2017-06-22 ENCOUNTER — Inpatient Hospital Stay (HOSPITAL_COMMUNITY): Payer: No Typology Code available for payment source | Admitting: Physical Therapy

## 2017-06-22 ENCOUNTER — Inpatient Hospital Stay (HOSPITAL_COMMUNITY): Payer: Self-pay | Admitting: Physical Therapy

## 2017-06-22 ENCOUNTER — Inpatient Hospital Stay (HOSPITAL_COMMUNITY): Payer: No Typology Code available for payment source
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
## 2017-06-22 NOTE — Progress Notes (Signed)
Physical Therapy Note  Patient Details  Name: Patrick [name] MRN: [id number] Date of Birth: May 15, 1966 Today's Date: 06/22/2017    Time: 1110-1148 38 minutes  1:1 No c/o pain. Pt at nurses station agreeable to treatment.  Pt perseverative on "my vision is messed up" throughout session, requires max redirection for attention to task.  Gait throughout unit with min guard, pt still with mild ataxia during gait.  Stair negotiation x 8 stairs with 1 handrail with min A.  Horseshoe toss with min guard for standing balance to toss and to pick horseshoes up from the ground.  nustep x 5 minutes total with 5 x redirection for attention to task.  Pt left in w/c at nurses station with quick release belt donned.   [name] 06/22/2017, 12:20 PM

## 2017-06-22 NOTE — Progress Notes (Signed)
Speech Language Pathology Daily Session Note  Patient Details  Name: Patrick [name] [name] MRN: [id number] Date of Birth: 1966-03-19  Today's Date: 06/22/2017 SLP Individual Time: 1300-1400 SLP Individual Time Calculation (min): 60 min  Short Term Goals: Week 3: SLP Short Term Goal 1 (Week 3): Patient will orient to person, place and situation with Max A multimodal cues.  SLP Short Term Goal 2 (Week 3): Patient will initiate a functional task with Max A multimodal cues in 50% of opportunities  SLP Short Term Goal 3 (Week 3): Patient will demonstrate focused attention to a task for 60 seconds with Max A verbal cues for redirection.   Skilled Therapeutic Interventions: Skilled treatment session focused on cognitive goals. Upon arrival, patient was supine in bed. SLP facilitated session by providing Max A multimodal cues and more than a reasonable amount of time for patient to transfer to the wheelchair. Patient perseverative on seeing bugs on his skin and asking for a "tool to dig them out." Patient's family member present during the last 30 minutes of session and confirmed biographical information about patient that he reported with 100% accuracy during an informal and functional conversation. Patient left upright in wheelchair with cousin present and quick release belt in place. Continue with current plan of care.      Function:    Cognition Comprehension Comprehension assist level: Understands basic 25 - 49% of the time/ requires cueing 50 - 75% of the time  Expression   Expression assist level: Expresses basic 25 - 49% of the time/requires cueing 50 - 75% of the time. Uses single words/gestures.  Social Interaction Social Interaction assist level: Interacts appropriately 25 - 49% of time - Needs frequent redirection.  Problem Solving Problem solving assist level: Solves basic less than 25% of the time - needs direction nearly all the time or does not effectively solve problems and may  need a restraint for safety  Memory Memory assist level: Recognizes or recalls less than 25% of the time/requires cueing greater than 75% of the time    Pain Pain Assessment Faces Pain Scale: Hurts whole lot Pain Type: Acute pain Pain Location: Arm Pain Orientation: Right Pain Descriptors / Indicators: Aching Pain Intervention(s): Medication (See eMAR)  Therapy/Group: Individual Therapy  Draeden Kellman 06/22/2017, 3:33 PM

## 2017-06-22 NOTE — Progress Notes (Signed)
Occupational Therapy Session Note  Patient Details  Name: Patrick [name] MRN: [id number] Date of Birth: 1965/09/17  Today's Date: 06/22/2017 OT Individual Time: [id number] OT Individual Time Calculation (min): 62 min   Short Term Goals: Week 2:  OT Short Term Goal 1 (Week 2): Pt will initiate bathing 1 area of body during shower  OT Short Term Goal 2 (Week 2): Pt will attend to UB dressing task for 10 seconds to complete at least 1/4 components  OT Short Term Goal 3 (Week 2): Pt will be A&Ox1 consistently during BADL sessions  Skilled Therapeutic Interventions/Progress Updates:    Pt greeted at RN station. Once he was escorted back to room, pt engaging in grooming tasks/oral care with improved initiation and sustained attention when compared to OT sessions last week. Pt still perseverating on skin. Had him apply lotion to UEs/LEs due to dryness, particularly on R UE. Worked on decreasing hypersensitivity by increasing touch tolerance to Rt at this time. Gentle AAROM R UE completed with pt grimacing and jerking away due to pain. Educated pt on importance of AAROM for preserving functional use once arm heals with pt verbalizing that he understood. Pt visibly trying to control physical reactions to pain, and also to assist OT with moving the arm. He had the most ROM limitations with supination/pronation. Afterwards pt assisted OT with donning Ted hose and gripper socks with LEs placed alternatively in figure 4 position (using Lt hand for pulling them up). Pt standing at sink to complete oral care with Min A for balance and min cues for sequencing. Took him 5 minutes to initiate. At end of tx pt was left in w/c with safety belt fastened, chair alarm activated, and escorted to RN station. Placed pts Rt arm in extension on pillow and applied ice pack for pain relief.    Therapy Documentation Precautions:  Precautions Precautions: Fall Precaution Comments: TBI/ do not push on center of forehead  / depressed skull fx  Required Braces or Orthoses: Sling Restrictions Weight Bearing Restrictions: Yes RUE Weight Bearing: Non weight bearing RLE Weight Bearing: Weight bearing as tolerated Pain: Pain Assessment Faces Pain Scale: Hurts even more Pain Type: Acute pain Pain Location: Arm Pain Orientation: Right Pain Descriptors / Indicators: Aching Pain Intervention(s): Medication (See eMAR);Cold applied ADL:      See Function Navigator for Current Functional Status.   Therapy/Group: Individual Therapy  Shantae Vantol A Adriane Guglielmo 06/22/2017, 12:39 PM

## 2017-06-22 NOTE — Progress Notes (Signed)
Physical Therapy Session Note  Patient Details  Name: Patrick [name] MRN: [id number] Date of Birth: [birth year]  Today's Date: 06/21/2017 PT Individual Time: 1600-1630   30 min  Short Term Goals: Week 3:  PT Short Term Goal 1 (Week 3): Pt will ambulate 25 ft with LRAD & supervision. PT Short Term Goal 2 (Week 3): Pt will initiate stair training. PT Short Term Goal 3 (Week 3): Pt will perform car transfer with min assist and LRAD.  Skilled Therapeutic Interventions/Progress Updates:   [facility] with no signs of pain. PT treatment session focused on sustained attention to gait tasks throughout rehab unit. Pt able to initiate sit>stand x 3 throughout treatment and performed gait training for 23450ft, 27300ft and 75 ft with Hand hold assist from PT. Min cues from PT to redirect pt back to task when distracted by signs in hall. Throughout treatment Pt repeatedly ask PT, "am I going to make it through this?"  Each time, PT provided positive emotional support, and Pt noted to be encouraged replying "Damn Skippy". Pt unable to attend to Nustep task for longer than 15 seconds. Returned to RN station at end of treatment.       Therapy Documentation Precautions:  Precautions Precautions: Fall Precaution Comments: TBI/ do not push on center of forehead / depressed skull fx  Required Braces or Orthoses: Sling Restrictions Weight Bearing Restrictions: Yes RUE Weight Bearing: Non weight bearing RLE Weight Bearing: Weight bearing as tolerated Vital Signs: Therapy Vitals Temp: 98.1 F (36.7 C) Temp Source: Oral Pulse Rate: 83 Resp: 18 BP: 109/71 Patient Position (if appropriate): Sitting Oxygen Therapy SpO2: 97 % O2 Device: Not Delivered Pain: Pain Assessment Pain Score: 0-No pain   See Function Navigator for Current Functional Status.   Therapy/Group: Individual Therapy  Golden Popustin E Briyana Badman 06/22/2017, 5:20 AM
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
## 2017-06-23 ENCOUNTER — Inpatient Hospital Stay (HOSPITAL_COMMUNITY): Payer: Self-pay | Admitting: Occupational Therapy

## 2017-06-23 NOTE — Progress Notes (Signed)
Occupational Therapy Session Note  Patient Details  Name: Patrick [name] MRN: [id number] Date of Birth: 1966/02/20  Today's Date: 06/23/2017 OT Individual Time: 1003-1101 OT Individual Time Calculation (min): 58 min   Short Term Goals: Week 2:  OT Short Term Goal 1 (Week 2): Pt will initiate bathing 1 area of body during shower  OT Short Term Goal 2 (Week 2): Pt will attend to UB dressing task for 10 seconds to complete at least 1/4 components  OT Short Term Goal 3 (Week 2): Pt will be A&Ox1 consistently during BADL sessions  Skilled Therapeutic Interventions/Progress Updates:    Pt greeted supine in enclosure bed, inquiring as to how therapist was and if today had gone well so far. He verbalized willingness to shower. Pt transitioned to EOB with supervision and increased time for initiation, then ambulated with HHA into bathroom to transfer on TTB. Pt doffed shirt, and then held head in his hands. Stating that he was going to "puke" and that he felt terrible. Pt required max cues for sustained attention to remove pants in order to shower. He sat on bench for 20 minutes, perseverating on skin and how he wanted to go home to "see his babies." Also about how his left eye "is going blind." Max cues to redirect him to task. He then stood up, agitated, and reported that he wanted to get out of shower. Min A for ambulation back into room and pt initiating eating breakfast while standing at bedside table. He continued eating EOB with supervision while OT played meaningful music to lessen agitation. This visibly appeared to calm him and he reported feeling better. HOH for bilateral UE use when consuming orange juice. AAROM R UE completed with pt grimacing and withdrawing at gentle touch/guidance. Very limited forearm rotation. Elbow flexion/extension and finger extension were less aversive to him. Continued with Rt desensitization/forearm rotation during lotion application for pt to have more control  over ROM. At end of tx pt returned to supine and was safely secured in enclosure bed. Continued playing his favorite music at low volume for behavior mgt and promotion of neuroplastic changes.   A&O to self and situation today. Reoriented him to time and place.    Therapy Documentation Precautions:  Precautions Precautions: Fall Precaution Comments: TBI/ do not push on center of forehead / depressed skull fx  Required Braces or Orthoses: Sling Restrictions Weight Bearing Restrictions: Yes RUE Weight Bearing: Non weight bearing RLE Weight Bearing: Weight bearing as tolerated   Pain: When touching R UE and during R UE ROM exercises. RN made aware. Pt declining cryotherapy post tx  Pain Assessment Pain Assessment: No/denies pain ADL:     See Function Navigator for Current Functional Status.   Therapy/Group: Individual Therapy  Patrick [name] 06/23/2017, 12:18 PM

## 2017-06-23 NOTE — Progress Notes (Signed)
Patrick [name] is a 52 y.o. male [birth year] [id number]  Subjective: No new complaints. Verbally interactive but undirected intention of words. Says he slept well and feels good.  Objective: Vital signs in last 24 hours: Temp:  [98.3 F (36.8 C)] 98.3 F (36.8 C) (01/26 0432) Pulse Rate:  [97] 97 (01/26 0432) Resp:  [18] 18 (01/26 0432) BP: (97-98)/(64-65) 97/65 (01/26 0432) SpO2:  [97 %] 97 % (01/26 0432) Weight change:  Last BM Date: 06/21/17  Intake/Output from previous day: 01/25 0701 - 01/26 0700 In: 420 [P.O.:420] Out: -   Physical Exam General: No apparent distress   In bed net for safety due to compulsive behaviors and safety risk Lungs: Normal effort. Lungs clear to auscultation, no crackles or wheezes. Cardiovascular: Regular rate and rhythm, no edema Neurological: No new neurological deficits   Lab Results: BMET    Component Value Date/Time   NA 139 06/19/2017 0553   K 4.2 06/19/2017 0553   CL 105 06/19/2017 0553   CO2 23 06/19/2017 0553   GLUCOSE 98 06/19/2017 0553   BUN 16 06/19/2017 0553   CREATININE 0.86 06/19/2017 0553   CALCIUM 9.0 06/19/2017 0553   GFRNONAA >60 06/19/2017 0553   GFRAA >60 06/19/2017 0553   CBC    Component Value Date/Time   WBC 5.5 06/19/2017 0553   RBC 4.46 06/19/2017 0553   HGB 12.7 (L) 06/19/2017 0553   HCT 39.2 06/19/2017 0553   PLT 311 06/19/2017 0553   MCV 87.9 06/19/2017 0553   MCH 28.5 06/19/2017 0553   MCHC 32.4 06/19/2017 0553   RDW 13.4 06/19/2017 0553   LYMPHSABS 2.3 06/07/2017 0947   MONOABS 0.5 06/07/2017 0947   EOSABS 0.3 06/07/2017 0947   BASOSABS 0.0 06/07/2017 0947   CBG's (last 3):  No results for input(s): GLUCAP in the last 72 hours. LFT's Lab Results  Component Value Date   ALT 33 06/07/2017   AST 19 06/07/2017   ALKPHOS 219 (H) 06/07/2017   BILITOT 0.4 06/07/2017    Studies/Results: Dg Forearm Right  Result Date: 06/22/2017 CLINICAL DATA:  Postop EXAM: RIGHT FOREARM - 2 VIEW  COMPARISON:  05/24/2017, 05/08/2017 FINDINGS: Stable surgical plate and multiple screw fixation of the proximal to midshaft of the radius and ulna across comminuted fractures. Periosteal new bone formation noted consistent with healing response. Prominent calcifications in the proximal forearm, possible developing heterotopic ossification. Nondisplaced fracture distal shaft of the ulna with periostitis, consistent with healing fracture, fracture lucencies remain visible. IMPRESSION: 1. Stable surgical plate and screw fixation of the proximal to midshaft of the radius and ulna across comminuted fractures. No significant change in alignment. Mild periosteal new bone formation consistent with healing response. 2. No significant change in alignment of a nondisplaced distal ulna fracture with periosteal new bone formation evident. Fracture lucencies remain visible 3. Prominent soft tissue calcification in the proximal forearm suggest developing heterotopic ossification. Electronically Signed   By: Jasmine Pang M.D.   On: 06/22/2017 20:53    Medications:  I have reviewed the patient's current medications. Scheduled Medications: . divalproex  500 mg Oral Q8H  . enoxaparin (LOVENOX) injection  40 mg Subcutaneous Q24H  . methylphenidate  15 mg Oral BID WC  . nicotine  14 mg Transdermal Daily  . pantoprazole  40 mg Oral Q1200  . polyethylene glycol  17 g Oral Daily  . QUEtiapine  50 mg Oral QHS  . 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 Cognitive and  visual deficits following polytrauma and TBI - continue ongoing IP rehab therapies, renew restraint for safety as noted above 2. Behavioral disturbance due to #1 - psyc med adjustments reviewed - continue ego support in addition to medical mgmt and therapy 3. Pain mgmt for polytrauma - reports sufficient control at present   [facility] HospitalValerie A. Felicity CoyerLeschber, MD 06/23/2017, 12:07 PM

## 2017-06-24 ENCOUNTER — Inpatient Hospital Stay (HOSPITAL_COMMUNITY): Payer: Self-pay | Admitting: Physical Therapy

## 2017-06-24 NOTE — Progress Notes (Signed)
Patrick [name] is a 52 y.o. male 05/27/1966 [id number]  Subjective: Reports hallucinations of seeing snakes last night.  [place].  Denies pain or other problems.  Feels well.   Objective: Vital signs in last 24 hours: Temp:  [97.7 F (36.5 C)] 97.7 F (36.5 C) (01/27 0500) Pulse Rate:  [80] 80 (01/27 0500) Resp:  [18] 18 (01/27 0500) BP: (96)/(69) 96/69 (01/27 0500) SpO2:  [96 %-97 %] 97 % (01/27 0500) Weight change:  Last BM Date: 06/24/17  Intake/Output from previous day: 01/26 0701 - 01/27 0700 In: 180 [P.O.:180] Out: -   Physical Exam General: No apparent distress    pleasant and very cooperative.  In bed net restraint for safety. Lungs: Normal effort. Lungs clear to auscultation, no crackles or wheezes. Cardiovascular: Regular rate and rhythm, no edema Neurological: No new neurological deficits   Lab Results: BMET    Component Value Date/Time   NA 139 06/19/2017 0553   K 4.2 06/19/2017 0553   CL 105 06/19/2017 0553   CO2 23 06/19/2017 0553   GLUCOSE 98 06/19/2017 0553   BUN 16 06/19/2017 0553   CREATININE 0.86 06/19/2017 0553   CALCIUM 9.0 06/19/2017 0553   GFRNONAA >60 06/19/2017 0553   GFRAA >60 06/19/2017 0553   CBC    Component Value Date/Time   WBC 5.5 06/19/2017 0553   RBC 4.46 06/19/2017 0553   HGB 12.7 (L) 06/19/2017 0553   HCT 39.2 06/19/2017 0553   PLT 311 06/19/2017 0553   MCV 87.9 06/19/2017 0553   MCH 28.5 06/19/2017 0553   MCHC 32.4 06/19/2017 0553   RDW 13.4 06/19/2017 0553   LYMPHSABS 2.3 06/07/2017 0947   MONOABS 0.5 06/07/2017 0947   EOSABS 0.3 06/07/2017 0947   BASOSABS 0.0 06/07/2017 0947   CBG's (last 3):  No results for input(s): GLUCAP in the last 72 hours. LFT's Lab Results  Component Value Date   ALT 33 06/07/2017   AST 19 06/07/2017   ALKPHOS 219 (H) 06/07/2017   BILITOT 0.4 06/07/2017    Studies/Results: Dg Forearm Right  Result Date:  06/22/2017 CLINICAL DATA:  Postop EXAM: RIGHT FOREARM - 2 VIEW COMPARISON:  05/24/2017, 05/08/2017 FINDINGS: Stable surgical plate and multiple screw fixation of the proximal to midshaft of the radius and ulna across comminuted fractures. Periosteal new bone formation noted consistent with healing response. Prominent calcifications in the proximal forearm, possible developing heterotopic ossification. Nondisplaced fracture distal shaft of the ulna with periostitis, consistent with healing fracture, fracture lucencies remain visible. IMPRESSION: 1. Stable surgical plate and screw fixation of the proximal to midshaft of the radius and ulna across comminuted fractures. No significant change in alignment. Mild periosteal new bone formation consistent with healing response. 2. No significant change in alignment of a nondisplaced distal ulna fracture with periosteal new bone formation evident. Fracture lucencies remain visible 3. Prominent soft tissue calcification in the proximal forearm suggest developing heterotopic ossification. Electronically Signed   By: Jasmine PangKim  Fujinaga M.D.   On: 06/22/2017 20:53    Medications:  I have reviewed the patient's current medications. Scheduled Medications: . divalproex  500 mg Oral Q8H  . enoxaparin (LOVENOX) injection  40 mg Subcutaneous Q24H  . methylphenidate  15 mg Oral BID WC  . nicotine  14 mg Transdermal Daily  . pantoprazole  40 mg Oral Q1200  . polyethylene glycol  17 g Oral Daily  . QUEtiapine  50 mg Oral QHS  . 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  Cognitive and visual deficits following polytrauma and traumatic brain injury.  Continue ongoing CIR therapies.  Renew restraint for safety as noted above for distractibility and periods of agitation.  2.  Behavioral disturbance due to above.  Recent psych med adjustments reviewed.  Continue ego support and ability to participate in therapy. 3.  Pain management for polytrauma.  Reports sufficient control of pain at present time.  No changes recommended  Length of stay, days: 18  Kaycee Mcgaugh A. Felicity Coyer, MD 06/24/2017, 9:45 AM

## 2017-06-24 NOTE — Progress Notes (Signed)
Physical Therapy Note  Patient Details  Name: Patrick [name] MRN: [id number] Date of Birth: 08-17-1965 Today's Date: 06/24/2017    Time: 800-840 40 minutes  1:1 Pt c/o Rt UE pain, RN made aware and meds given after session.  Pt performs bed mobility and transfers with supervision.  Pt able to don/doff socks, requires assist for threading pants and shirt. Standing balance with min A to pull up pants.  Pt performs gait throughout unit initially with close supervision, min A at end of session when pt more fatigued.  nustep per pt request x 5 minutes with mod cuing for attention to task. Pt perseverating on Lt eye vision, "spots" on legs.  Pt performs standing balance with horseshoe toss with min guard.  Pt left in room with RN present.   Caitlyne Ingham 06/24/2017, 8:40 AM

## 2017-06-25 ENCOUNTER — Inpatient Hospital Stay (HOSPITAL_COMMUNITY): Payer: Self-pay | Admitting: Occupational Therapy

## 2017-06-25 ENCOUNTER — Inpatient Hospital Stay (HOSPITAL_COMMUNITY): Payer: No Typology Code available for payment source | Admitting: Physical Therapy

## 2017-06-25 ENCOUNTER — Inpatient Hospital Stay (HOSPITAL_COMMUNITY): Payer: Self-pay | Admitting: Physical Therapy

## 2017-06-25 ENCOUNTER — Inpatient Hospital Stay (HOSPITAL_COMMUNITY): Payer: No Typology Code available for payment source | Admitting: Speech Pathology

## 2017-06-25 NOTE — Progress Notes (Signed)
Occupational Therapy Weekly Progress Note  Patient Details  Name: Patrick [name] MRN: [id number] Date of Birth: [birth year]  Beginning of progress report period: 06/17/17 End of progress report period: 06/25/17  Today's Date: 06/25/2017 OT Individual Time:  - 1003-1101 Individual Treatment Time Calculation: 58 min    Patient has met 3 of 3 short term goals.    Pt has made functional progress at time of report. During last report period, he required Max-Total A with BADLs, with total A to initiate and maintain attention to these tasks. He currently requires Mod A for bathing at shower level, supervision for UB dressing, and steady assist for LB dressing. Pts ability to initiate functional task completion has improved. He is also consistently oriented to self and situation. His sustained attention continues to be impaired, influenced by pain, preservation, and self limiting behaviors. He presently exhibits behaviors consistent with those of Level V on the [place]. D/c planning and OT education has been initiated with family. Continue POC.  Patient continues to demonstrate the following deficits: muscle weakness, decreased cardiorespiratoy endurance, decreased coordination and decreased motor planning, decreased visual acuity, decreased motor planning, decreased initiation, decreased attention, decreased awareness, decreased problem solving, decreased safety awareness and decreased memory and decreased standing balance, decreased balance strategies and difficulty maintaining precautions and therefore will continue to benefit from skilled OT intervention to enhance overall performance with BADL.  Patient progressing toward long term goals..  Continue plan of care.  OT Short Term Goals Week 2:  OT Short Term Goal 1 (Week 2): Pt will initiate bathing 1 area of body during shower  OT Short Term Goal 1 - Progress (Week 2): Met OT Short Term Goal 2 (Week 2): Pt will attend to UB  dressing task for 10 seconds to complete at least 1/4 components  OT Short Term Goal 2 - Progress (Week 2): Met OT Short Term Goal 3 (Week 2): Pt will be A&Ox1 consistently during BADL sessions OT Short Term Goal 3 - Progress (Week 2): Met Week 3:  OT Short Term Goal 1 (Week 3): Pt will complete 1 grooming task in standing with min vcs for sequencing and attention  OT Short Term Goal 2 (Week 3): Pt will complete LB dressing with supervision  OT Short Term Goal 3 (Week 3): Pt will bathe in shower with less than 10 minutes to initiate bathing activity   Skilled Therapeutic Interventions/Progress Updates:    Pt greeted in enclosure bed. Reporting that he missed his "babies" and wanted to go home. Provided therapeutic listening and emotional support. Encouraged him to engage in self care tasks in prep for family visit tomorrow (his birthday!). Pt initially agreeable, ambulated with Min A into bathroom and urinated in toilet while standing, then transferred to TTB with Min A. Pt then reported feeling sick, ambulated back to toilet to void bladder again with Min A, and then would not return to shower. Once EOB, pt reporting that he wanted to die and went back to bed. Gentle AAROM R UE while pt was in supine, encouraging him to participate in tx. Discussed his goals for d/c home. Pt still reluctant to get OOB, perseverating on wanting to die. He would intermittently transfer EOB, and then lie back down. At this time, family arrived. They assisted OT with providing encouragement for OOB activity. Pt still adamantly refusing. Discussed DME needs and d/c plans with family. Per Patrick [name] (daughter Patrick [name]'s mother), he will be d/c to her home with daughters Patrick [name]  and Patrick [name]) splitting 24/7 supervision needs. At end of tx pt was safely secured in enclosure bed and left with visitors.    Therapy Documentation Precautions:  Precautions Precautions: Fall Precaution Comments: TBI/ do not push on center of forehead /  depressed skull fx  Required Braces or Orthoses: Sling Restrictions Weight Bearing Restrictions: Yes RUE Weight Bearing: Non weight bearing RLE Weight Bearing: Weight bearing as tolerated   Pain: R UE hypersensitive to touch/ROM. Pt declining cryotherapy at end of tx   ADL:      See Function Navigator for Current Functional Status.   Therapy/Group: Individual Therapy  Patrick [name] A Patrick [name] 06/25/2017, 5:02 PM

## 2017-06-25 NOTE — Progress Notes (Addendum)
Physical Therapy Session Note  Patient Details  Name: Patrick [name] MRN: [id number] Date of Birth: [birth year]  Today's Date: 06/25/2017 PT Individual Time: [id number] and [id number] PT Individual Time Calculation (min): 57 min and 25 min   Short Term Goals: Week 3:  PT Short Term Goal 1 (Week 3): Pt will ambulate 25 ft with LRAD & supervision. PT Short Term Goal 2 (Week 3): Pt will initiate stair training. PT Short Term Goal 3 (Week 3): Pt will perform car transfer with min assist and LRAD.  Skilled Therapeutic Interventions/Progress Updates:  Treatment 1: Pt received in room consuming breakfast. Pt engaged in conversation regarding orientation to situation, location. Pt beginning to use RUE during functional tasks (adding condiments to food, dressing himself). Pt reports pain "comes & goes" in RUE & RN aware. Pt able to recall his birthday is tomorrow. Pt donned ted hose, socks, paper scrub pants & shirt with supervision<>min assist overall from w/c level. Pt requires cuing for attention to task but this is slightly improved compared to last week. Pt reports need to use restroom and ambulates within room/bathroom with min assist; pt with continent void standing in bathroom. Pt completed hand hygiene standing at sink with steady<>min assist for balance. Pt ambulates room<>ortho gym without seated rest break but min assist for balance with pt demonstrating antalgic gait. Pt able to pathfind back to room with max assist. At end of session pt left in enclosure bed with all needs within reach.  Therapist attempted to perform PROM/AAROM supination/pronation to RUE forearm but barely able to move arm out of neutral before pt reports pain.  Pt continues to report "I'm going blind" and upon further assessment pt has blurry vision in L eye but reports normal vision in R eye. During session pt also reported "I can't breath" due to a "cramping" sensation in R neck/shoulder & RN made aware.     Treatment 2: Pt received in bed & agreeable to tx. Pt with c/o RUE pain & RN made aware & administered meds at end of session. Pt ambulated throughout unit without AD & steady<>min assist for balance. Pt negotiated 12 steps with L rail and min assist with decreased balance when descending stairs; pt reports being nervous when negotiating stairs and requires max cuing to not use RUE for balance/support with poor return demo. Pt engaged in cognitive task requiring him to sort bean bags by color & he did so with max cuing to attend to task. Pt was then asked to sort them by food group and pt required max assist to do so. Therapist continued to orient pt to location, date, and situation throughout session. Pt completed hand hygiene standing at sink with close supervision for standing balance. At end of session pt returned to enclosure bed & was left with all needs within reach.    Therapy Documentation Precautions:  Precautions Precautions: Fall Precaution Comments: TBI/ do not push on center of forehead / depressed skull fx  Required Braces or Orthoses: Sling Restrictions Weight Bearing Restrictions: Yes RUE Weight Bearing: Non weight bearing RLE Weight Bearing: Weight bearing as tolerated  See Function Navigator for Current Functional Status.   Therapy/Group: Individual Therapy  Sandi MariscalVictoria M Ishan Sanroman 06/25/2017, 4:41 PM

## 2017-06-25 NOTE — Progress Notes (Signed)
Speech Language Pathology Daily Session Note  Patient Details  Name: Van ClinesDoyle Dean [name] MRN: [id number] Date of Birth: December 26, 1965  Today's Date: 06/25/2017 SLP Individual Time: 1400-1500 SLP Individual Time Calculation (min): 60 min  Short Term Goals: Week 3: SLP Short Term Goal 1 (Week 3): Patient will orient to person, place and situation with Max A multimodal cues.  SLP Short Term Goal 2 (Week 3): Patient will initiate a functional task with Max A multimodal cues in 50% of opportunities  SLP Short Term Goal 3 (Week 3): Patient will demonstrate focused attention to a task for 60 seconds with Max A verbal cues for redirection.   Skilled Therapeutic Interventions: Skilled treatment session focused on cognitive goals. SLP facilitated session by providing Mod A verbal cues for selective attention in a minimally distracting environment for ~30 minutes and problem solving during a basic and familiar card task. Patient also participated in a basic and appropriate conversation for ~4 turns with Mod I. Patient continues to demonstrate intermittent sexually inappropriate comments with perseveration, however, can be redirected. Overall, patient had a great day today!      Function:  Comprehension Comprehension assist level: Understands basic 25 - 49% of the time/ requires cueing 50 - 75% of the time  Expression   Expression assist level: Expresses basic 25 - 49% of the time/requires cueing 50 - 75% of the time. Uses single words/gestures.  Social Interaction Social Interaction assist level: Interacts appropriately 25 - 49% of time - Needs frequent redirection.  Problem Solving Problem solving assist level: Solves basic 25 - 49% of the time - needs direction more than half the time to initiate, plan or complete simple activities  Memory Memory assist level: Recognizes or recalls 25 - 49% of the time/requires cueing 50 - 75% of the time    Pain No/Denies Pain   Therapy/Group: Individual  Therapy  Tosca Pletz 06/25/2017, 3:08 PM

## 2017-06-25 NOTE — Progress Notes (Signed)
Physical Therapy Session Note  Patient Details  Name: Patrick [name] MRN: [id number] Date of Birth: Apr 05, 1966  Today's Date: 06/25/2017 PT Individual Time: 1105-1200 PT Individual Time Calculation (min): 55 min   Short Term Goals: Week 3:  PT Short Term Goal 1 (Week 3): Pt will ambulate 25 ft with LRAD & supervision. PT Short Term Goal 2 (Week 3): Pt will initiate stair training. PT Short Term Goal 3 (Week 3): Pt will perform car transfer with min assist and LRAD.  Skilled Therapeutic Interventions/Progress Updates:   Pt presented sitting at EOB with family present. Pt preserverating on death of sister (sept 2018)and PTA and family attempting to redirect. Pt eventually requesting to use toilet and performed with supervision. Pt agreeable to ambulate in unit with pt ambulating with supervision/min guard. Pt performed sitting peg board for sustained task. Pt required frequent re-direction and re-orientation to place and situation. Pt performed NuStep L4 x 5 min with frequent redirection to task. Pt  Ambulated back to room min guard and left sitting at EOB with nurse present.      Therapy Documentation Precautions:  Precautions Precautions: Fall Precaution Comments: TBI/ do not push on center of forehead / depressed skull fx  Required Braces or Orthoses: Sling Restrictions Weight Bearing Restrictions: Yes RUE Weight Bearing: Non weight bearing RLE Weight Bearing: Weight bearing as tolerated   See Function Navigator for Current Functional Status.   Therapy/Group: Individual Therapy  Lailee Hoelzel  Tersa Fotopoulos, PTA  06/25/2017, 12:58 PM

## 2017-06-25 NOTE — Progress Notes (Signed)
[place] PHYSICAL MEDICINE & REHABILITATION     PROGRESS NOTE    Subjective/Complaints: Ongoing delusions and hallucinations.  Did sleep somewhat  ROS: pt denies nausea, vomiting, diarrhea, cough, shortness of breath or chest pain   Objective: Vital Signs: Blood pressure 103/73, pulse 89, temperature 97.9 F (36.6 C), temperature source Axillary, resp. rate 18, height 6' (1.829 m), weight 70 kg (154 lb 5.2 oz), SpO2 97 %. No results found. No results for input(s): WBC, HGB, HCT, PLT in the last 72 hours. No results for input(s): NA, K, CL, GLUCOSE, BUN, CREATININE, CALCIUM in the last 72 hours.  Invalid input(s): CO CBG (last 3)  No results for input(s): GLUCAP in the last 72 hours.  Wt Readings from Last 3 Encounters:  06/13/17 70 kg (154 lb 5.2 oz)  06/02/17 69.8 kg (153 lb 14.1 oz)    Physical Exam:  Constitutional: NAD. Vital signs reviewed. HENT: Depressed areas mid forehead-stable  Eyes: Eyes open.  No areas of drainage or irritation Cardiovascular:RRR without murmur. No JVD      Respiratory: CTA Bilaterally without wheezes or rales. Normal effort    GI: Bowel sounds are normal. He exhibits no distension.  Musculoskeletal: He exhibits no edema or tenderness in all extremities.  Limited movement right upper extremity Neurological: Attention perhaps a bit better.  Language of confusion Skin: Warm and dry. Intact. Psychiatric: Anxious at times  Assessment/Plan: 1.  Visual and cognitive deficits as well as balance problems secondary to polytrauma and TBI which require 3+ hours per day of interdisciplinary therapy in a comprehensive inpatient rehab setting. Physiatrist is providing close team supervision and 24 hour management of active medical problems listed below. Physiatrist and rehab team continue to assess barriers to discharge/monitor patient progress toward functional and medical goals.  Function:  Bathing Bathing position   Position: Shower  Bathing parts  Body parts bathed by patient: Right arm, Left arm, Chest, Abdomen, Front perineal area, Right upper leg, Left upper leg Body parts bathed by helper: Right lower leg, Left lower leg, Back  Bathing assist Assist Level: Touching or steadying assistance(Pt > 75%)      Upper Body Dressing/Undressing Upper body dressing   What is the patient wearing?: Pull over shirt/dress     Pull over shirt/dress - Perfomed by patient: Thread/unthread left sleeve, Pull shirt over trunk Pull over shirt/dress - Perfomed by helper: Thread/unthread right sleeve, Put head through opening        Upper body assist Assist Level: (Mod A)      Lower Body Dressing/Undressing Lower body dressing   What is the patient wearing?: Non-skid slipper socks, Ted Hose       Pants- Performed by helper: Thread/unthread right pants leg, Thread/unthread left pants leg, Pull pants up/down   Non-skid slipper socks- Performed by helper: Don/doff right sock, Don/doff left sock               TED Hose - Performed by helper: Don/doff right TED hose, Don/doff left TED hose  Lower body assist        Toileting Toileting Toileting activity did not occur: No continent bowel/bladder event Toileting steps completed by patient: Adjust clothing prior to toileting, Performs perineal hygiene, Adjust clothing after toileting Toileting steps completed by helper: Adjust clothing prior to toileting, Performs perineal hygiene, Adjust clothing after toileting Toileting Assistive Devices: Grab bar or rail  Toileting assist Assist level: Touching or steadying assistance (Pt.75%)   Transfers Chair/bed transfer Chair/bed transfer activity did not occur:  Safety/medical concerns Chair/bed transfer method: Ambulatory Chair/bed transfer assist level: Touching or steadying assistance (Pt > 75%) Chair/bed transfer assistive device: Other(HHA)     Locomotion Ambulation     Max distance: 10 ft Assist level: Touching or steadying assistance  (Pt > 75%)   Wheelchair Wheelchair activity did not occur: Safety/medical concerns        Cognition Comprehension Comprehension assist level: Understands basic 25 - 49% of the time/ requires cueing 50 - 75% of the time  Expression Expression assist level: Expresses basic 25 - 49% of the time/requires cueing 50 - 75% of the time. Uses single words/gestures.  Social Interaction Social Interaction assist level: Interacts appropriately 25 - 49% of time - Needs frequent redirection.  Problem Solving Problem solving assist level: Solves basic 25 - 49% of the time - needs direction more than half the time to initiate, plan or complete simple activities  Memory Memory assist level: Recognizes or recalls 25 - 49% of the time/requires cueing 50 - 75% of the time   Medical Problem List and Plan: 1.  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  So some flashes of improvements at times 2.  DVT Prophylaxis/Anticoagulation: Pharmaceutical: Lovenox   Vascular study negative for DVT 3. Pain Management: Hold oxycodone to improve daytime arousal.  Use tramadol for breakthrough pain or tylenol prn depending of pain intensity. 4. Mood: Team to provide ego support to continue help manage anxiety. LCSW to follow for evaluation and support when appropriate.    -Music therapy helpful 5. Neuropsych: This patient is not capable of making decisions on his ownbehalf.   -Continue Ritalin  15 mg for attention and focus  6. Skin/Wound Care: Pressure relief measures 7. Fluids/Electrolytes/Nutrition: I/Os 8. Mild hyponatremia:    Sodium 139 on 06/19/2017. 9.  Leucocytosis: Resolved   WBCs decreased to 5.5 on 06/19/2017 10. Agitation/emotional dyscontrol: Continue scheduled valproic acid, increased to 500 mg 3 times  daily    -  valproic acid level 82 . -Continue Seroquel at night for sleep.     -continue Risperdal   0.5 mg twice daily during the day to help with emotional lability     -continue music therapy     -sleep wake chart   -Continue enclosure bed for safety which is still required for safety 11. H/o polysubstance abuse: Educate on cessation when appropriate.    LOS (Days) 19 A FACE TO FACE EVALUATION WAS PERFORMED  Ranelle OysterSWARTZ,[place] Dowd T, MD 06/25/2017 9:12 AM

## 2017-06-26 ENCOUNTER — Inpatient Hospital Stay (HOSPITAL_COMMUNITY): Payer: No Typology Code available for payment source | Admitting: Physical Therapy

## 2017-06-26 ENCOUNTER — Inpatient Hospital Stay (HOSPITAL_COMMUNITY): Payer: No Typology Code available for payment source | Admitting: Speech Pathology

## 2017-06-26 ENCOUNTER — Inpatient Hospital Stay (HOSPITAL_COMMUNITY): Payer: No Typology Code available for payment source | Admitting: Occupational Therapy

## 2017-06-26 NOTE — Progress Notes (Signed)
Physical Therapy Session Note  Patient Details  Name: Patrick [name] MRN: [id number] Date of Birth: [birth year]  Today's Date: 06/26/2017 PT Individual Time: [id number] PT Individual Time Calculation (min): 88 min   Short Term Goals: Week 3:  PT Short Term Goal 1 (Week 3): Pt will ambulate 25 ft with LRAD & supervision. PT Short Term Goal 2 (Week 3): Pt will initiate stair training. PT Short Term Goal 3 (Week 3): Pt will perform car transfer with min assist and LRAD.  Skilled Therapeutic Interventions/Progress Updates:  Pt received supine in bed with blanket soiled with urine -- educated pt on need to call for assistance when needing to use the bathroom. Pt transferred OOB with max encouragement and ambulated within room/bathroom without AD and steady/min assist for balance. Pt showered while seated on shower bench with max cuing to utilize washcloth but pt continued to use hand. Pt required max cuing to wash posterior peri region and assistance to wash back. From w/c level pt donned thigh high ted hose (max assist to initiate), socks, and scrub pants & top with cuing to initiate tasks. In dayroom pt consumed ice cream with RUE with improved functional use of extremity, although pt still with minimal supination/pronation. While eating, pt engaged in activity identifying plastic animals. Pt required max assist to correctly name 6/24 but was able to identify the rest. Pt utilized kinetron in sitting and standing (LUE support only) with min assist for balance with task focusing on weight shifting and BLE strengthening. Pt assembled monthly calendar with max cuing to attend to task (max sustained attention of ~60 seconds) and cuing to initiate task. Pt reported "I don't feel too good" and noted "spinning"; HR = 96 bpm and BP = 103/71 mmHg (LUE sitting in w/c) and RN made aware. At end of session pt left sitting in room with QRB donned, set up with meal tray, and RN present to supervise.   Therapy  Documentation Precautions:  Precautions Precautions: Fall Precaution Comments: TBI/ do not push on center of forehead / depressed skull fx  Required Braces or Orthoses: Sling Restrictions Weight Bearing Restrictions: Yes RUE Weight Bearing: Non weight bearing RLE Weight Bearing: Weight bearing as tolerated   See Function Navigator for Current Functional Status.   Therapy/Group: Individual Therapy  Patrick [name] 06/26/2017, 12:20 PM

## 2017-06-26 NOTE — Progress Notes (Signed)
Occupational Therapy Session Note  Patient Details  Name: Van ClinesDoyle Dean [name] MRN: [id number] Date of Birth: [birth year]  Today's Date: 06/26/2017 OT Individual Time: [id number] and [id number] OT Individual Time Calculation (min): 70 min and 27 min   Short Term Goals: Week 3:  OT Short Term Goal 1 (Week 3): Pt will complete 1 grooming task in standing with min vcs for sequencing and attention  OT Short Term Goal 2 (Week 3): Pt will complete LB dressing with supervision  OT Short Term Goal 3 (Week 3): Pt will bathe in shower with less than 10 minutes to initiate bathing activity   Skilled Therapeutic Interventions/Progress Updates:    Session 1:Upon entering the room, pt sleeping soundly but agreeable to OT intervention. Pt performed supine >sit with supervision as he is motivated by breakfast tray. OT opened containers and cut sausage and pt feeding self with R UE this session. Pt occasionally grimacing with R UE movement but continued to feed self with dominant hand without cues to do so. Pt continues to be hyperverbal throughout session and tearful at times but continues to safely eat food with supervision. Pt ambulating in room with steady assistance and without use of AD to throw items in trash in order to "pick up" his room. Pt returning to enclosure bed at end of session secondary to fatigue. Pt returned to supine and pulls covers over face to rest. Bed secured and call bell within pt reach.  Session 2: Pt received at RN station via wheelchair. Pt continues to be very tearful and perseverative on family concerns. Pt unable to name all of children or ages this session. Pt given paper handout to fill out and given instructions to finish. Pt initiating task within 3 minutes and min verbal cues. Pt writing name,birthday, and correct city accurately on paper. Pt also verbalizing only L side of paper being "blurry" when he reads. Pt able to attend and read 3 sentences verbally when asked with min  cuing. Pt returned to RN station for safety at end of session via wheelchair with quick release belt donned.    Therapy Documentation Precautions:  Precautions Precautions: Fall Precaution Comments: TBI/ do not push on center of forehead / depressed skull fx  Required Braces or Orthoses: Sling Restrictions Weight Bearing Restrictions: Yes RUE Weight Bearing: Non weight bearing RLE Weight Bearing: Weight bearing as tolerated General:   Vital Signs: Therapy Vitals Temp: (!) 97.5 F (36.4 C) Temp Source: Oral Pulse Rate: 83 Resp: 18 BP: 121/75 Patient Position (if appropriate): Lying Oxygen Therapy SpO2: 99 % O2 Device: Not Delivered Pain: Pain Assessment Pain Assessment: No/denies pain  See Function Navigator for Current Functional Status.   Therapy/Group: Individual Therapy  Alen BleacherBradsher, Janiyla Long P 06/26/2017, 10:12 AM

## 2017-06-26 NOTE — Progress Notes (Signed)
Speech Language Pathology Daily Session Note  Patient Details  Name: Patrick [name] MRN: [id number] Date of Birth: 1965-11-13  Today's Date: 06/26/2017 SLP Individual Time: 1300-1400 SLP Individual Time Calculation (min): 60 min  Short Term Goals: Week 3: SLP Short Term Goal 1 (Week 3): Patient will orient to person, place and situation with Max A multimodal cues.  SLP Short Term Goal 2 (Week 3): Patient will initiate a functional task with Max A multimodal cues in 50% of opportunities  SLP Short Term Goal 3 (Week 3): Patient will demonstrate focused attention to a task for 60 seconds with Max A verbal cues for redirection.   Skilled Therapeutic Interventions: Skilled treatment session focused on cognitive goals. SLP facilitated session by providing Max A verbal cues problem solving during a mildly complex, novel task and for selective attention to task in a mildly distracting environment for ~2 minute intervals. Patient with perseveration on specific topics as well as perseverating on seeing "bugs" on his body. Patient left at RN station with chair alarm on and quick release belt in place.  Continue with current plan of care.     Function:   Cognition Comprehension Comprehension assist level: Understands basic 25 - 49% of the time/ requires cueing 50 - 75% of the time  Expression   Expression assist level: Expresses basic 25 - 49% of the time/requires cueing 50 - 75% of the time. Uses single words/gestures.  Social Interaction Social Interaction assist level: Interacts appropriately 25 - 49% of time - Needs frequent redirection.  Problem Solving Problem solving assist level: Solves basic 25 - 49% of the time - needs direction more than half the time to initiate, plan or complete simple activities  Memory Memory assist level: Recognizes or recalls 25 - 49% of the time/requires cueing 50 - 75% of the time    Pain No/Denies Pain   Therapy/Group: Individual Therapy  Patrick [name],  Patrick [name] 06/26/2017, 2:34 PM
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
## 2017-06-27 ENCOUNTER — Inpatient Hospital Stay (HOSPITAL_COMMUNITY): Payer: No Typology Code available for payment source | Admitting: Physical Therapy

## 2017-06-27 ENCOUNTER — Encounter (HOSPITAL_COMMUNITY): Payer: Self-pay | Admitting: Psychology

## 2017-06-27 ENCOUNTER — Inpatient Hospital Stay (HOSPITAL_COMMUNITY): Payer: No Typology Code available for payment source | Admitting: Occupational Therapy

## 2017-06-27 ENCOUNTER — Inpatient Hospital Stay (HOSPITAL_COMMUNITY): Payer: Self-pay | Admitting: Speech Pathology

## 2017-06-27 NOTE — Progress Notes (Signed)
Speech Language Pathology Daily Session Note  Patient Details  Name: Patrick [name] MRN: [id number] Date of Birth: [birth year]  Today's Date: 06/27/2017 SLP Individual Time: 1330-1400 SLP Individual Time Calculation (min): 30 min  Short Term Goals: Week 3: SLP Short Term Goal 1 (Week 3): Patient will orient to person, place and situation with Max A multimodal cues.  SLP Short Term Goal 2 (Week 3): Patient will initiate a functional task with Max A multimodal cues in 50% of opportunities  SLP Short Term Goal 3 (Week 3): Patient will demonstrate focused attention to a task for 60 seconds with Max A verbal cues for redirection.   Skilled Therapeutic Interventions: Skilled treatment session focused on cognitive goals. Upon arrival, patient was supine in bed. Pt required Mod A verbal cues and extra time to transfer to the wheelchair. SLP facilitated session by providing Mod A verbal cues for recall of procedures to a previously learned task (yesterday's session - Blink). SLP further facilitated session by providing Mod A verbal cues for problem solving and Min A verbal cues for selective attention to task in a mildly distracting environment for ~15 minutes. Pt left upright in wheelchair with RN present. Continue with current plan of care.    Function:  Cognition Comprehension Comprehension assist level: Understands basic 75 - 89% of the time/ requires cueing 10 - 24% of the time  Expression   Expression assist level: Expresses basic 75 - 89% of the time/requires cueing 10 - 24% of the time. Needs helper to occlude trach/needs to repeat words.  Social Interaction Social Interaction assist level: Interacts appropriately 50 - 74% of the time - May be physically or verbally inappropriate.  Problem Solving Problem solving assist level: Solves basic 50 - 74% of the time/requires cueing 25 - 49% of the time  Memory Memory assist level: Recognizes or recalls 50 - 74% of the time/requires cueing 25  - 49% of the time    Pain Pain Assessment Pain Assessment: No/denies pain   Therapy/Group: Individual Therapy  Roxan HockeyYahui Shaneya Taketa  SLP - Student 06/27/2017, 2:30 PM

## 2017-06-27 NOTE — Progress Notes (Signed)
Occupational Therapy Session Note  Patient Details  Name: Patrick [name] MRN: [id number] Date of Birth: 1965/06/05  Today's Date: 06/27/2017 OT Individual Time: 1048-1200 and 1515- 1600 OT Individual Time Calculation (min): 72 min and 45 min   Short Term Goals: Week 3:  OT Short Term Goal 1 (Week 3): Pt will complete 1 grooming task in standing with min vcs for sequencing and attention  OT Short Term Goal 2 (Week 3): Pt will complete LB dressing with supervision  OT Short Term Goal 3 (Week 3): Pt will bathe in shower with less than 10 minutes to initiate bathing activity   Skilled Therapeutic Interventions/Progress Updates:    Session 1: Upon entering the room, pt supine in bed and sleeping soundly. Pt with new clothing and showing insight by stating, " I need to take a shower before putting on my new clothes." Pt picking out clothing items with min verbal guidance cues to locate all needed items. Pt ambulating into bathroom with steady assistance and transferring onto TTB to wash self. Pt remained seated throughout shower and needing steadying assistance to exit for safety secondary to increased fall risk. Pt ambulating to sit on EOB and don clothing items with increased time for initiation but only required min cues for sequencing. Pt transferred to wheelchair at end of session with quick release belt donned. OT placed pt at RN station for safety at end of session.   Session 2: Pt received at RN station and with no c/o pain. Pt ambulates to room without AD with close supervision and able to locate room with increased time. Pt verbalizing need to use bathroom and standing to urinate with intermittent supervision for safety. Pt managed clothing with supervision as well. Pt obtaining all needed items to shave face with mod verbal guidance cues but then declined to shave until tomorrow's session. Pt seated on EOB and feeding self snack with R UE. Enclosure bed unzipped during session with pt  making no move to stand or exit bed without assistance. OT reviewed need to use call bell to go to bathroom or for assistance. Pt pressed call bell when asked. Bed secured at end of session with call bell within reach.  Therapy Documentation Precautions:  Precautions Precautions: Fall Precaution Comments: TBI/ do not push on center of forehead / depressed skull fx  Required Braces or Orthoses: Sling Restrictions Weight Bearing Restrictions: Yes RUE Weight Bearing: Non weight bearing RLE Weight Bearing: Weight bearing as tolerated General:   Vital Signs:   Pain: Pain Assessment Pain Assessment: No/denies pain  See Function Navigator for Current Functional Status.   Therapy/Group: Individual Therapy  Alen BleacherBradsher, Dawnn Nam P 06/27/2017, 12:45 PM
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
## 2017-06-27 NOTE — Progress Notes (Signed)
Physical Therapy Weekly Progress Note  Patient Details  Name: Patrick [name] MRN: [id number] Date of Birth: [birth year]  Beginning of progress report period: June 21, 2017 End of progress report period: June 28, 2017  Today's Date: 06/28/2017   Patient has met 3 of 3 short term goals.  Pt continues to make slow progress towards all LTG's as he continues to be limited by cognition. Pt is progressing to a supervision level overall for functional mobility. Pt would benefit from continued skilled PT treatment to focus on balance, NMR, RUE ROM, gait, and cognitive remediation. Pt's caregivers also require education and hands on training prior to pt d/c to ensure safe d/c.  Patient continues to demonstrate the following deficits muscle weakness, decreased cardiorespiratoy endurance, decreased coordination, decreased visual acuity and decreased visual perceptual skills, decreased initiation, decreased attention, decreased awareness, decreased problem solving, decreased safety awareness, decreased memory and delayed processing, and decreased sitting balance, decreased standing balance, decreased balance strategies and difficulty maintaining precautions and therefore will continue to benefit from skilled PT intervention to increase functional independence with mobility.  Patient progressing toward long term goals..  Continue plan of care.  PT Short Term Goals Week 3:  PT Short Term Goal 1 (Week 3): Pt will ambulate 25 ft with LRAD & supervision. PT Short Term Goal 1 - Progress (Week 3): Met PT Short Term Goal 2 (Week 3): Pt will initiate stair training. PT Short Term Goal 2 - Progress (Week 3): Met PT Short Term Goal 3 (Week 3): Pt will perform car transfer with min assist and LRAD. PT Short Term Goal 3 - Progress (Week 3): Met Week 4:  PT Short Term Goal 1 (Week 4): Pt will demonstrate sustained attention for 5 minutes in a minimally distracting environment with min cuing. PT Short Term  Goal 2 (Week 4): Pt will demonstrate emergent awareness with min cuing.  PT Short Term Goal 3 (Week 4): Pt will complete floor transfer while maintaining weight bearing precautions with mod cuing.    Therapy Documentation Precautions:  Precautions Precautions: Fall Precaution Comments: TBI/ do not push on center of forehead / depressed skull fx  Required Braces or Orthoses: Sling Restrictions Weight Bearing Restrictions: Yes RUE Weight Bearing: Non weight bearing RLE Weight Bearing: Weight bearing as tolerated   See Function Navigator for Current Functional Status.  Therapy/Group: Individual Therapy  [name] 06/28/2017, 8:01 AM
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
## 2017-06-28 ENCOUNTER — Inpatient Hospital Stay (HOSPITAL_COMMUNITY): Payer: No Typology Code available for payment source | Admitting: Physical Therapy

## 2017-06-28 ENCOUNTER — Inpatient Hospital Stay (HOSPITAL_COMMUNITY): Payer: Self-pay | Admitting: Occupational Therapy

## 2017-06-28 ENCOUNTER — Inpatient Hospital Stay (HOSPITAL_COMMUNITY): Payer: No Typology Code available for payment source | Admitting: Speech Pathology

## 2017-06-28 NOTE — Progress Notes (Signed)
Speech Language Pathology Weekly Progress and Session Note  Patient Details  Name: Patrick [name] MRN: [id number] Date of Birth: [birth year]  Beginning of progress report period: June 21, 2017 End of progress report period: June 28, 2017  Today's Date: 06/28/2017 SLP Individual Time: 8144-8185 SLP Individual Time Calculation (min): 55 min  Short Term Goals: Week 3: SLP Short Term Goal 1 (Week 3): Patient will orient to person, place and situation with Max A multimodal cues.  SLP Short Term Goal 1 - Progress (Week 3): Met SLP Short Term Goal 2 (Week 3): Patient will initiate a functional task with Max A multimodal cues in 50% of opportunities  SLP Short Term Goal 2 - Progress (Week 3): Met SLP Short Term Goal 3 (Week 3): Patient will demonstrate focused attention to a task for 60 seconds with Max A verbal cues for redirection.  SLP Short Term Goal 3 - Progress (Week 3): Met    New Short Term Goals: Week 4: SLP Short Term Goal 1 (Week 4): Patient will demonstrate selective attention in a mildly distractive environment for 60 minutes with Mod A verbal cues for redirection. SLP Short Term Goal 2 (Week 4): Patient will recall funtional information with Mod A verbal and visual cues. SLP Short Term Goal 3 (Week 4): Patient will demonstrate functional problem solving with basic and familiar tasks with Mod A verbal cues.  SLP Short Term Goal 4 (Week 4): Patient will demonstrate emergent awareness by self-monitoring and correcting errors during functional tasks with Mod A verbal cues.   Weekly Progress Updates: Patient continues to make functional gains and has met 3 out of 3 STGs this reporting period. Currently, pt requires overall Mod A verbal cues to sustain attention to a task for 10 minutes and initiate functional tasks. Patient also requires Min A verbal cues for orientation to time. Patient's cognition continues to progress and higher-level cognitive functions are being  addressed at this time such as intellectual awareness and problem solving. Patient also demonstrates decreased verbosity with increased topic maintenance that is more appropriate.  Patient and family education is ongoing. Patient would benefit from continued skilled SLP intervention to maximize his cognitive function and overall functional independence prior to discharge.    Intensity: Minumum of 1-2 x/day, 30 to 90 minutes Frequency: 3 to 5 out of 7 days Duration/Length of Stay: 07/09/2017 Treatment/Interventions: Cognitive remediation/compensation;Environmental controls;Internal/external aids;Therapeutic Activities;Patient/family education;Functional tasks;Cueing hierarchy   Daily Session  Skilled Therapeutic Interventions: Skilled treatment session focused on cognitive goals. SLP facilitated session by providing extra time and Mod A verbal cues for initiation of transfer from the bed to the wheelchair. SLP further facilitated session by administering MOCA version 7.2. Pt scored 19 out of 30 points with a score of 26 and above considered normal. Pt demonstrated deficits in attention, problem solving, recall, and visual spatial deficits, and required Mod A verbal cues for selective attention in a mildly distracting environment for ~45 minutes throughout task. Pt left supine in new high-low bed with alarm on and all needs within reach. Patient verbalized the need to utilize the call bell to request assistance. Continue with current plan of care.        Function:   Eating Eating   Modified Consistency Diet: No Eating Assist Level: No help, No cues         Cognition Comprehension Comprehension assist level: Understands basic 75 - 89% of the time/ requires cueing 10 - 24% of the time  Expression   Expression assist  level: Expresses basic 75 - 89% of the time/requires cueing 10 - 24% of the time. Needs helper to occlude trach/needs to repeat words.  Social Interaction Social Interaction assist  level: Interacts appropriately 50 - 74% of the time - May be physically or verbally inappropriate.  Problem Solving Problem solving assist level: Solves basic 25 - 49% of the time - needs direction more than half the time to initiate, plan or complete simple activities  Memory Memory assist level: Recognizes or recalls 25 - 49% of the time/requires cueing 50 - 75% of the time     Pain No/denies pain  Therapy/Group: Individual Therapy  Meredeth Ide  SLP - Student 06/28/2017, 12:57 PM

## 2017-06-28 NOTE — Progress Notes (Signed)
Speech Language Pathology Daily Session Note  Patient Details  Name: Patrick [name] [name] MRN: [id number] Date of Birth: Nov 11, 1965  Today's Date: 06/28/2017 SLP Individual Time: 1130-1200 SLP Individual Time Calculation (min): 30 min  Short Term Goals: Week 4: SLP Short Term Goal 1 (Week 4): Patient will demonstrate selective attention in a mildly distractive environment for 60 minutes with Mod A verbal cues for redirection. SLP Short Term Goal 2 (Week 4): Patient will recall funtional information with Mod A verbal and visual cues. SLP Short Term Goal 3 (Week 4): Patient will demonstrate functional problem solving with basic and familiar tasks with Mod A verbal cues.  SLP Short Term Goal 4 (Week 4): Patient will demonstrate emergent awareness by self-monitoring and correcting errors during functional tasks with Mod A verbal cues.   Skilled Therapeutic Interventions: Skilled treatment session focused on cognitive goals. SLP facilitated session by providing Mod A verbal cues for intellectual awareness in regards to physical and cognitive deficits. Patient intermittently perseverative on talking about his sister but was easily redirected. Lunch arrived early and patient performed tray set-up with Mod I and was able to alternate attention between self-feeding and conversation with supervision verbal cues. Of note, no unsafe behaviors noted since patient received high-low bed. Patient left upright sitting EOB with NT present. Continue with current plan of care.      Function:  Eating Eating   Modified Consistency Diet: No Eating Assist Level: No help, No cues   Eating Set Up Assist For: Cutting food;Opening containers       Cognition Comprehension Comprehension assist level: Understands basic 75 - 89% of the time/ requires cueing 10 - 24% of the time  Expression   Expression assist level: Expresses basic 75 - 89% of the time/requires cueing 10 - 24% of the time. Needs helper to occlude  trach/needs to repeat words.  Social Interaction Social Interaction assist level: Interacts appropriately 50 - 74% of the time - May be physically or verbally inappropriate.  Problem Solving Problem solving assist level: Solves basic 25 - 49% of the time - needs direction more than half the time to initiate, plan or complete simple activities  Memory Memory assist level: Recognizes or recalls 25 - 49% of the time/requires cueing 50 - 75% of the time    Pain No/Denies Pain   Therapy/Group: Individual Therapy  Kaydance Bowie 06/28/2017, 1:47 PM

## 2017-06-28 NOTE — Progress Notes (Signed)
Occupational Therapy Session Note  Patient Details  Name: Patrick [name] MRN: [id number] Date of Birth: 1965/08/04  Today's Date: 06/28/2017 OT Individual Time: 0705-0800 OT Individual Time Calculation (min): 55 min    Short Term Goals: Week 3:  OT Short Term Goal 1 (Week 3): Pt will complete 1 grooming task in standing with min vcs for sequencing and attention  OT Short Term Goal 2 (Week 3): Pt will complete LB dressing with supervision  OT Short Term Goal 3 (Week 3): Pt will bathe in shower with less than 10 minutes to initiate bathing activity   Skilled Therapeutic Interventions/Progress Updates:    Upon entering the room, pt supine in enclosure bed sleeping soundly. Pt requesting to use bathroom and pt ambulating to bathroom with close supervision and standing to urinate. Pt performed clothing management with supervision as well. Pt washing hands with min verbal cues for hygiene and increased time to complete task. Pt seated on EOB with breakfast and required assistance to cut sausage and open containers. Pt feeding self with R UE although he reports pain with use. Pt using call bell to request pain medication this session on his own! Pt continues to be tearful during session but does not make sexually inappropriate comments. RN arriving with medication as therapist exits the room.   Therapy Documentation Precautions:  Precautions Precautions: Fall Precaution Comments: TBI/ do not push on center of forehead / depressed skull fx  Required Braces or Orthoses: Sling Restrictions Weight Bearing Restrictions: Yes RUE Weight Bearing: Non weight bearing RLE Weight Bearing: Weight bearing as tolerated  See Function Navigator for Current Functional Status.   Therapy/Group: Individual Therapy  Alen BleacherBradsher, Tyrie Porzio P 06/28/2017, 9:21 AM

## 2017-06-28 NOTE — Progress Notes (Addendum)
Social Work Patient ID: Patrick [name], male   DOB: 24-Sep-1965, 52 y.o.   MRN: [id number]  Met with pt's daughter, daughter's mother and pt following team conference.  All aware and pleased with targeted d/c date of Monday 2/11 with supervision goals.  Daughter and her mother continue to confirm their plan to share in provision of 24/7 supervision for pt at d/c.  Daughter reports that she believes she has seen pt's overall function and cognition improving since last week.  Discussed need for family conference and we have now planned for this to take place on 2/4 @ 11:00 am.  Will continue to follow.  Grenda Lora, LCSW

## 2017-06-28 NOTE — Progress Notes (Signed)
[facility]     PROGRESS NOTE    Subjective/Complaints: Patient up with therapy this morning.  Denies any pain.  Mention his vision once again.  Slept most of the night per nurse  ROS: pt denies nausea, vomiting, diarrhea, cough, shortness of breath or chest pain  Objective: Vital Signs: Blood pressure 115/70, pulse 100, temperature 98 F (36.7 C), temperature source Oral, resp. rate 18, height 6' (1.829 m), weight 70 kg (154 lb 5.2 oz), SpO2 100 %. No results found. No results for input(s): WBC, HGB, HCT, PLT in the last 72 hours. No results for input(s): NA, K, CL, GLUCOSE, BUN, CREATININE, CALCIUM in the last 72 hours.  Invalid input(s): CO CBG (last 3)  No results for input(s): GLUCAP in the last 72 hours.  Wt Readings from Last 3 Encounters:  06/13/17 70 kg (154 lb 5.2 oz)  06/02/17 69.8 kg (153 lb 14.1 oz)    Physical Exam:  Constitutional: NAD. Vital signs reviewed. HENT: Depressed area forehead Eyes: Eyes open.  No areas of drainage or irritation Cardiovascular: RRR without murmur. No JVD       Respiratory: CTA Bilaterally without wheezes or rales. Normal effort      GI: Bowel sounds are normal. He exhibits no distension.  Musculoskeletal: He exhibits no edema or tenderness in all extremities.  Limited movement right upper extremity Neurological: improved attention. Follows commands more directly now Skin: Warm and dry. Intact. Psychiatric: calmer.  Still distractible but redirectable today  Assessment/Plan: 1.  Visual and cognitive deficits as well as balance problems secondary to polytrauma and TBI which require 3+ hours per day of interdisciplinary therapy in a comprehensive inpatient rehab setting. Physiatrist is providing close team supervision and 24 hour management of active medical problems listed below. Physiatrist and rehab team continue to assess barriers to discharge/monitor patient progress toward functional and medical  goals.  Function:  Bathing Bathing position   Position: Shower  Bathing parts Body parts bathed by patient: Chest, Abdomen, Front perineal area, Buttocks, Right arm, Left arm, Left lower leg, Right upper leg, Left upper leg, Right lower leg Body parts bathed by helper: Back  Bathing assist Assist Level: Supervision or verbal cues      Upper Body Dressing/Undressing Upper body dressing   What is the patient wearing?: Pull over shirt/dress     Pull over shirt/dress - Perfomed by patient: Thread/unthread right sleeve, Thread/unthread left sleeve, Put head through opening, Pull shirt over trunk Pull over shirt/dress - Perfomed by helper: Thread/unthread right sleeve, Put head through opening        Upper body assist Assist Level: Supervision or verbal cues, Set up   Set up : To obtain clothing/put away  Lower Body Dressing/Undressing Lower body dressing   What is the patient wearing?: Pants, Non-skid slipper socks     Pants- Performed by patient: Thread/unthread right pants leg, Thread/unthread left pants leg, Pull pants up/down Pants- Performed by helper: Thread/unthread right pants leg, Thread/unthread left pants leg, Pull pants up/down Non-skid slipper socks- Performed by patient: Don/doff left sock, Don/doff right sock Non-skid slipper socks- Performed by helper: Don/doff right sock, Don/doff left sock             TED Hose - Performed by patient: Don/doff right TED hose, Don/doff left TED hose TED Hose - Performed by helper: Don/doff right TED hose, Don/doff left TED hose  Lower body assist Assist for lower body dressing: Set up, Supervision or verbal cues  Set up : To obtain clothing/put away  Toileting Toileting Toileting activity did not occur: No continent bowel/bladder event Toileting steps completed by patient: Adjust clothing prior to toileting, Performs perineal hygiene, Adjust clothing after toileting Toileting steps completed by helper: Adjust clothing prior  to toileting, Performs perineal hygiene, Adjust clothing after toileting Toileting Assistive Devices: Grab bar or rail  Toileting assist Assist level: Supervision or verbal cues   Transfers Chair/bed transfer Chair/bed transfer activity did not occur: Safety/medical concerns Chair/bed transfer method: Ambulatory Chair/bed transfer assist level: Supervision or verbal cues Chair/bed transfer assistive device: Other(HHA)     Locomotion Ambulation     Max distance: 15' Assist level: Supervision or verbal cues   Wheelchair Wheelchair activity did not occur: Safety/medical concerns        Cognition Comprehension Comprehension assist level: Understands basic 75 - 89% of the time/ requires cueing 10 - 24% of the time  Expression Expression assist level: Expresses basic 75 - 89% of the time/requires cueing 10 - 24% of the time. Needs helper to occlude trach/needs to repeat words.  Social Interaction Social Interaction assist level: Interacts appropriately 50 - 74% of the time - May be physically or verbally inappropriate.  Problem Solving Problem solving assist level: Solves basic 50 - 74% of the time/requires cueing 25 - 49% of the time  Memory Memory assist level: Recognizes or recalls 50 - 74% of the time/requires cueing 25 - 49% of the time   Medical Problem List and Plan: 1.  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  DVT Prophylaxis/Anticoagulation: Pharmaceutical: Lovenox   Vascular study negative for DVT 3. Pain Management: Hold oxycodone to improve daytime arousal.  Use tramadol for breakthrough pain or tylenol prn depending of pain intensity. 4. Mood: Team to provide ego support to continue help manage anxiety. LCSW to follow for  evaluation and support when appropriate.    -Music therapy helpful 5. Neuropsych: This patient is not capable of making decisions on his ownbehalf.   -Continue Ritalin  15 mg for attention and focus  6. Skin/Wound Care: Pressure relief measures 7. Fluids/Electrolytes/Nutrition: I/Os 8. Mild hyponatremia:    Sodium 139 on 06/19/2017. 9.  Leucocytosis: Resolved   WBCs decreased to 5.5 on 06/19/2017 10. Agitation/emotional dyscontrol: Continue scheduled valproic acid, increased to 500 mg 3 times daily    -  valproic acid level 82 . -Continue Seroquel at night for sleep.     -Risperdal 0.5 mg twice daily appears to have helped with daytime agitation/psychosis. ?titration       -continue music therapy     -sleep wake chart ongoing    -Patient demonstrating some improvements in behavior as well as impulsivity.  Will this continue the enclosure bed and transition to low bed.  11. H/o polysubstance abuse: Educate on cessation when appropriate.    LOS (Days) 22 A FACE TO FACE EVALUATION WAS PERFORMED  Ranelle OysterSWARTZ,Patrick [name] T, MD 06/28/2017 9:19 AM

## 2017-06-28 NOTE — Progress Notes (Addendum)
Physical Therapy Session Note  Patient Details  Name: Patrick [name] [name] MRN: [id number] Date of Birth: [birth year]  Today's Date: 06/28/2017 PT Individual Time: [id number] and [id number] PT Individual Time Calculation (min): 56 min and 60 min  Short Term Goals: Week 4:  PT Short Term Goal 1 (Week 4): Pt will demonstrate sustained attention for 5 minutes in a minimally distracting environment with min cuing. PT Short Term Goal 2 (Week 4): Pt will demonstrate emergent awareness with min cuing.  PT Short Term Goal 3 (Week 4): Pt will complete floor transfer while maintaining weight bearing precautions with mod cuing.  Skilled Therapeutic Interventions/Progress Updates:  Treatment 1: Pt received in room & agreeable to tx. Pt with intermittent c/o pain in RUE & pt repositioned extremity. Pt ambulated throughout unit without AD and steady assist for balance with task focusing on NMR & balance. Pt able to recall therapist's name on this date. Pt completed Berg Balance Test & scored 36/56. Patient demonstrates increased fall risk as noted by score of 36/56 on Berg Balance Scale.  (<36= high risk for falls, close to 100%; 37-45 significant >80%; 46-51 moderate >50%; 52-55 lower >25%). Pt requires frequent cuing to attend to tasks in a distracting environment and has difficulty following instructions for reaching forwards. Pt applied lotion to RUE but required assistance to apply it to LUE 2/2 inability to pronate R forearm. Pt assisted with creating monthly calendars to put in his room for orientation purposes. At end of session pt left in enclosure bed with all needs within reach. Pt able to report need to press call bell for assistance.   Treatment 2: Pt received in room in new high/low bed. Pt agreeable to tx, reporting intermittent pain in RUE. Pt ambulated throughout unit without AD & close supervision<>steady assist 2/2 decreased balance. Pt completed floor transfer (edge of mat>sitting on  floor>sitting on edge of mat) with max cuing to maintain NWB RUE & steady assist for overall transfer. Pt assembled puzzle in day room with moderate cuing to attend to task in a controlled environment and significantly extra time as pt is very internally distracted. Pt able to attend to task for 30 minutes with cuing. Pt completely assembled puzzle with moderate cuing for error correction & problem solving. Pt utilized nu-step on level 2 x 8 minutes with BLE only & task focusing on BLE strengthening & attention to task. Pt requires max cuing to calculate remaining minutes and focus on task as pt continues to be internally distracted and hyper verbal. At end of session pt left in bed with alarm on, bed low to floor, and all needs within reach. Reviewed use of call bell with pt.   Therapy Documentation Precautions:  Precautions Precautions: Fall Precaution Comments: TBI/ do not push on center of forehead / depressed skull fx  Required Braces or Orthoses: Sling Restrictions Weight Bearing Restrictions: Yes RUE Weight Bearing: Non weight bearing RLE Weight Bearing: Weight bearing as tolerated  Balance: Balance Balance Assessed: Yes Standardized Balance Assessment Standardized Balance Assessment: Berg Balance Test Berg Balance Test Sit to Stand: Able to stand  independently using hands Standing Unsupported: Able to stand 2 minutes with supervision Sitting with Back Unsupported but Feet Supported on Floor or Stool: Able to sit 2 minutes under supervision Stand to Sit: Sits safely with minimal use of hands Transfers: Able to transfer with verbal cueing and /or supervision Standing Unsupported with Eyes Closed: Able to stand 10 seconds with supervision Standing Ubsupported with Feet Together: Able  to place feet together independently and stand for 1 minute with supervision From Standing, Reach Forward with Outstretched Arm: Reaches forward but needs supervision(difficulty following commands to  complete task) From Standing Position, Pick up Object from Floor: Able to pick up shoe, needs supervision From Standing Position, Turn to Look Behind Over each Shoulder: Looks behind from both sides and weight shifts well Turn 360 Degrees: Needs close supervision or verbal cueing(close supervision) Standing Unsupported, Alternately Place Feet on Step/Stool: Able to complete 4 steps without aid or supervision(completes 8 steps but with close supervision) Standing Unsupported, One Foot in Front: Able to take small step independently and hold 30 seconds Standing on One Leg: Able to lift leg independently and hold equal to or more than 3 seconds Total Score: 36   See Function Navigator for Current Functional Status.   Therapy/Group: Individual Therapy  Sandi Mariscal 06/28/2017, 4:33 PM

## 2017-06-29 ENCOUNTER — Inpatient Hospital Stay (HOSPITAL_COMMUNITY): Payer: Self-pay | Admitting: Occupational Therapy

## 2017-06-29 ENCOUNTER — Inpatient Hospital Stay (HOSPITAL_COMMUNITY): Payer: No Typology Code available for payment source | Admitting: Speech Pathology

## 2017-06-29 ENCOUNTER — Inpatient Hospital Stay (HOSPITAL_COMMUNITY): Payer: No Typology Code available for payment source | Admitting: Physical Therapy

## 2017-06-29 ENCOUNTER — Inpatient Hospital Stay (HOSPITAL_COMMUNITY): Payer: Self-pay | Admitting: Physical Therapy

## 2017-06-29 NOTE — Progress Notes (Signed)
Occupational Therapy Session Note  Patient Details  Name: Patrick [name] [name] MRN: [id number] Date of Birth: 1966-05-20  Today's Date: 06/29/2017 OT Individual Time: 1103-1200 OT Individual Time Calculation (min): 57 min   Short Term Goals: Week 3:  OT Short Term Goal 1 (Week 3): Pt will complete 1 grooming task in standing with min vcs for sequencing and attention  OT Short Term Goal 2 (Week 3): Pt will complete LB dressing with supervision  OT Short Term Goal 3 (Week 3): Pt will bathe in shower with less than 10 minutes to initiate bathing activity   Skilled Therapeutic Interventions/Progress Updates:    Pt greeted supine in bed, teary and emotional regarding his daughter's financial situation. OT provided therapeutic listening and support while completing gentle AAROM of R UE. Pts ability to supinate has improved, however still very limited with pronation with pt exhibiting catch at about 7 degrees. Less hypersensitive to touch noted also. Continued working on increasing pronation tolerance with simple handwriting tasks using Rt hand while EOB. Pt engaging in orientation/memory writing tasks, including updating room calendar and writing down signature and names of his children. Due to pts emotional state, he declined further OOB activities. Pt was set up for lunch EOB with ice pack applied to R UE. Bed alarm activated and pt educated to lie back down in bed once he finished eating. He verbalized understanding. He was left with all needs within reach at session exit.   Therapy Documentation Precautions:  Precautions Precautions: Fall Precaution Comments: TBI/ do not push on center of forehead / depressed skull fx  Required Braces or Orthoses: Sling Restrictions Weight Bearing Restrictions: Yes RUE Weight Bearing: Non weight bearing RLE Weight Bearing: Weight bearing as tolerated Vital Signs: Therapy Vitals Temp: 98 F (36.7 C) Temp Source: Oral Pulse Rate: (!) 101 Resp: 18 BP:  108/70 Patient Position (if appropriate): Lying Oxygen Therapy SpO2: 98 % O2 Device: Not Delivered Pain: Addressed as written above Pain Assessment Pain Score: 0-No pain Faces Pain Scale: No hurt PAINAD (Pain Assessment in Advanced Dementia) Breathing: normal Negative Vocalization: none Facial Expression: smiling or inexpressive Body Language: relaxed Consolability: no need to console PAINAD Score: 0 ADL:      See Function Navigator for Current Functional Status.   Therapy/Group: Individual Therapy  Allayna Erlich A Sara Selvidge 06/29/2017, 5:34 PM

## 2017-06-29 NOTE — Progress Notes (Signed)
Speech Language Pathology Daily Session Note  Patient Details  Name: Patrick [name] [name] MRN: [id number] Date of Birth: 03-Apr-1966  Today's Date: 06/29/2017 SLP Individual Time: [id number] SLP Individual Time Calculation (min): 55 min  Short Term Goals: Week 4: SLP Short Term Goal 1 (Week 4): Patient will demonstrate selective attention in a mildly distractive environment for 60 minutes with Mod A verbal cues for redirection. SLP Short Term Goal 2 (Week 4): Patient will recall funtional information with Mod A verbal and visual cues. SLP Short Term Goal 3 (Week 4): Patient will demonstrate functional problem solving with basic and familiar tasks with Mod A verbal cues.  SLP Short Term Goal 4 (Week 4): Patient will demonstrate emergent awareness by self-monitoring and correcting errors during functional tasks with Mod A verbal cues.   Skilled Therapeutic Interventions: Skilled treatment session focused on cognitive goals. SLP facilitated session by providing extra time and Mod A verbal cues for initiation of transfer from the bed to the wheelchair. SLP further facilitated session by providing Min A verbal cues for problem solving with a basic task (Connect 4) and Mod A verbal cues for selective attention in a moderately distractive environment for redirection due to turn-taking task. However, pt demonstrated selective attention to tasks in a mildly distracting environment for ~50 minutes with Min A verbal cues for redirection during another basic problem solving task (peg board). Pt initially required Min A verbal cues for problem solving with another basic task (Peg board); however, as task difficulty increased, pt required Mod A verbal cues. Patient left in wheelchair with RN for transport back to room. Continue with current plan of care.   Function:  Cognition Comprehension Comprehension assist level: Understands basic 75 - 89% of the time/ requires cueing 10 - 24% of the time  Expression    Expression assist level: Expresses basic 75 - 89% of the time/requires cueing 10 - 24% of the time. Needs helper to occlude trach/needs to repeat words.  Social Interaction Social Interaction assist level: Interacts appropriately 50 - 74% of the time - May be physically or verbally inappropriate.  Problem Solving Problem solving assist level: Solves basic 50 - 74% of the time/requires cueing 25 - 49% of the time  Memory Memory assist level: Recognizes or recalls 25 - 49% of the time/requires cueing 50 - 75% of the time    Pain No/denies pain  Therapy/Group: Individual Therapy  Roxan HockeyYahui Aurelia Gras  SLP - Student 06/29/2017, 3:46 PM

## 2017-06-29 NOTE — Plan of Care (Signed)
  [facility] Description Description: See Patient Education module for eduction specifics 06/29/2017 1130 - Progressing by Thurston HoleBennett, Russell Engelstad, RN   RH SAFETY RH STG ADHERE TO SAFETY PRECAUTIONS W/ASSISTANCE/DEVICE Description STG Adhere to Safety Precautions With  Cues/supervsion Assistance/Device.  06/29/2017 1130 - Progressing by Thurston HoleBennett, Monic Engelmann, RN   RH PAIN MANAGEMENT RH STG PAIN MANAGED AT OR BELOW PT'S PAIN GOAL Description Pain at or below level of 4 on face pain scale.   06/29/2017 1130 - Progressing by Thurston HoleBennett, Nedim Oki, RN   RH Vision RH LTG Vision (Specify) 06/29/2017 1130 - Progressing by Thurston HoleBennett, Shacarra Choe, RN

## 2017-06-29 NOTE — Progress Notes (Signed)
Up x 1 this shift without asking for assistance. Patient re-educating on the necessity of calling for assistance.

## 2017-06-29 NOTE — Progress Notes (Signed)
Physical Therapy Session Note  Patient Details  Name: Patrick [name] [name] MRN: [id number] Date of Birth: 04-29-1966  Today's Date: 06/29/2017 PT Individual Time: [id number] and [id number] PT Individual Time Calculation (min): 57 min and 54 min   Short Term Goals: Week 4:  PT Short Term Goal 1 (Week 4): Pt will demonstrate sustained attention for 5 minutes in a minimally distracting environment with min cuing. PT Short Term Goal 2 (Week 4): Pt will demonstrate emergent awareness with min cuing.  PT Short Term Goal 3 (Week 4): Pt will complete floor transfer while maintaining weight bearing precautions with mod cuing.  Skilled Therapeutic Interventions/Progress Updates:  Treatment 1: Pt received in high/low bed & agreeable to tx. Pt reporting RUE pain & RN administered meds. Pt required max encouragement to transfer OOB as pt frequently asking to ride in w/c instead of walk. Pt reluctantly willing to ambulate throughout unit & did so with steady assist. Pt with decreased awareness completing quick turns and requiring min assist for balance. Pt oriented to date and situation with min cuing; pt able to recall therapist's name without assistance on this date. Pt engaged in n-1 memory card game with 80% accuracy - pt appears to be limited more by decreased attention during task. Pt required total cuing to utilize signs in hallway to locate current speech therapy office. Provided pt with memory journal and pt able to recall events from this session with max cuing. Educated pt on need to use memory journal daily. Pt reported need for new brief as current one was falling off. Pt doffed and re-donned pants with supervision from EOB/standing. Pt required max assist to don new brief. At end of session pt left in bed on lowest height, all needs within reach & bed alarm set. Therapist continued to educate pt on use of call bell.  Treatment 2: Pt received in bed & agreeable to tx. Pt continues to report pain in  RUE & appears to have some edema -- RN made aware. Pt required max encouragement to transfer OOB to engage in therapy. Pt ambulated throughout unit without AD & steady assist overall. In gym pt stood on compliant surface with min assist for balance while engaging in ball toss and dual cognitive task - pt demonstrates increased functional use of RUE on this date but does require max cuing to correctly engage in cognitive task. Pt utilized cybex kinetron in standing with LUE support and steady assist for balance with activity focusing on BLE strengthening & NMR. Pt reports need to use restroom and pathfinds to room with min cuing. Pt with continent void standing in bathroom with steady assist - pt's urine with foul odor & RN made aware. Pt completed hand hygiene standing at sink & returned to bed. Pt required max cuing to recall events from this session for therapist to document in memory journal. Pt left in bed with all needs within reach & bed alarm set. Reviewed use of call bell with pt.  Therapy Documentation Precautions:  Precautions Precautions: Fall Precaution Comments: TBI/ do not push on center of forehead / depressed skull fx  Required Braces or Orthoses: Sling Restrictions Weight Bearing Restrictions: Yes RUE Weight Bearing: Non weight bearing RLE Weight Bearing: Weight bearing as tolerated   See Function Navigator for Current Functional Status.   Therapy/Group: Individual Therapy  Sandi MariscalVictoria M Miller 06/29/2017, 4:39 PM

## 2017-06-30 ENCOUNTER — Inpatient Hospital Stay (HOSPITAL_COMMUNITY): Payer: Self-pay | Admitting: Occupational Therapy

## 2017-06-30 NOTE — Progress Notes (Signed)
[facility] PHYSICAL MEDICINE & REHABILITATION     PROGRESS NOTE    Subjective/Complaints: "feel sick" Pt cannot say what is bothering him.  No pains, no bladder or bowel issues  ROS: pt denies nausea, vomiting, diarrhea, cough, shortness of breath or chest pain  Objective: Vital Signs: Blood pressure 95/61, pulse 81, temperature 98.1 F (36.7 C), temperature source Oral, resp. rate 18, height 6' (1.829 m), weight 70 kg (154 lb 5.2 oz), SpO2 100 %. No results found. No results for input(s): WBC, HGB, HCT, PLT in the last 72 hours. No results for input(s): NA, K, CL, GLUCOSE, BUN, CREATININE, CALCIUM in the last 72 hours.  Invalid input(s): CO CBG (last 3)  No results for input(s): GLUCAP in the last 72 hours.  Wt Readings from Last 3 Encounters:  06/13/17 70 kg (154 lb 5.2 oz)  06/02/17 69.8 kg (153 lb 14.1 oz)    Physical Exam:  Constitutional: NAD. Vital signs reviewed. HENT: Depressed area forehead Eyes: Eyes open.  No areas of drainage or irritation Cardiovascular: RRR without murmur. No JVD       Respiratory: CTA Bilaterally without wheezes or rales. Normal effort      GI: Bowel sounds are normal. He exhibits no distension.  Musculoskeletal: He exhibits no edema or tenderness in all extremities.  Limited movement right upper extremity Neurological: improved attention. Follows commands more directly now Skin: Warm and dry. Intact. Psychiatric: calmer.  Still distractible but redirectable today  Assessment/Plan: 1.  Visual and cognitive deficits as well as balance problems secondary to polytrauma and TBI which require 3+ hours per day of interdisciplinary therapy in a comprehensive inpatient rehab setting. Physiatrist is providing close team supervision and 24 hour management of active medical problems listed below. Physiatrist and rehab team continue to assess barriers to discharge/monitor patient progress toward functional and medical  goals.  Function:  Bathing Bathing position   Position: Shower  Bathing parts Body parts bathed by patient: Chest, Abdomen, Front perineal area, Buttocks, Right arm, Left arm, Left lower leg, Right upper leg, Left upper leg, Right lower leg Body parts bathed by helper: Back  Bathing assist Assist Level: Supervision or verbal cues      Upper Body Dressing/Undressing Upper body dressing   What is the patient wearing?: Pull over shirt/dress     Pull over shirt/dress - Perfomed by patient: Thread/unthread right sleeve, Thread/unthread left sleeve, Put head through opening, Pull shirt over trunk Pull over shirt/dress - Perfomed by helper: Thread/unthread right sleeve, Put head through opening        Upper body assist Assist Level: Supervision or verbal cues, Set up   Set up : To obtain clothing/put away  Lower Body Dressing/Undressing Lower body dressing   What is the patient wearing?: Pants     Pants- Performed by patient: Thread/unthread right pants leg, Thread/unthread left pants leg, Pull pants up/down Pants- Performed by helper: Thread/unthread right pants leg, Thread/unthread left pants leg, Pull pants up/down Non-skid slipper socks- Performed by patient: Don/doff left sock, Don/doff right sock Non-skid slipper socks- Performed by helper: Don/doff right sock, Don/doff left sock             TED Hose - Performed by patient: Don/doff right TED hose, Don/doff left TED hose TED Hose - Performed by helper: Don/doff right TED hose, Don/doff left TED hose  Lower body assist Assist for lower body dressing: Set up, Supervision or verbal cues   Set up : To obtain clothing/put away  Toileting  Toileting Toileting activity did not occur: No continent bowel/bladder event Toileting steps completed by patient: Adjust clothing prior to toileting, Performs perineal hygiene, Adjust clothing after toileting Toileting steps completed by helper: Adjust clothing prior to toileting, Performs  perineal hygiene, Adjust clothing after toileting Toileting Assistive Devices: Grab bar or rail  Toileting assist Assist level: Supervision or verbal cues   Transfers Chair/bed transfer Chair/bed transfer activity did not occur: Safety/medical concerns Chair/bed transfer method: Ambulatory Chair/bed transfer assist level: Supervision or verbal cues Chair/bed transfer assistive device: Other(HHA)     Locomotion Ambulation     Max distance: >150 ft  Assist level: Touching or steadying assistance (Pt > 75%)   Wheelchair Wheelchair activity did not occur: Safety/medical concerns        Cognition Comprehension Comprehension assist level: Understands basic 90% of the time/cues < 10% of the time  Expression Expression assist level: Expresses basic 75 - 89% of the time/requires cueing 10 - 24% of the time. Needs helper to occlude trach/needs to repeat words.  Social Interaction Social Interaction assist level: Interacts appropriately 75 - 89% of the time - Needs redirection for appropriate language or to initiate interaction.  Problem Solving Problem solving assist level: Solves basic 50 - 74% of the time/requires cueing 25 - 49% of the time  Memory Memory assist level: Recognizes or recalls 50 - 74% of the time/requires cueing 25 - 49% of the time   Medical Problem List and Plan: 1.  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  DVT Prophylaxis/Anticoagulation: Pharmaceutical: Lovenox   Vascular study negative for DVT 3. Pain Management: Hold oxycodone to improve daytime arousal.  Use tramadol for breakthrough pain or tylenol prn depending of pain intensity. 4. Mood: Team to provide ego support to continue help manage anxiety. LCSW to follow  for evaluation and support when appropriate.    -Music therapy helpful 5. Neuropsych: This patient is not capable of making decisions on his ownbehalf.   -Continue Ritalin  15 mg for attention and focus  6. Skin/Wound Care: Pressure relief measures 7. Fluids/Electrolytes/Nutrition: I/Os 8. Mild hyponatremia:    Sodium 139 on 06/19/2017. 9.  Leucocytosis: Resolved   WBCs decreased to 5.5 on 06/19/2017 10. Agitation/emotional dyscontrol: Continue scheduled valproic acid, increased to 500 mg 3 times daily    -  valproic acid level 82 . -Continue Seroquel at night for sleep.     -Risperdal 0.5 mg twice daily appears to have helped with daytime agitation/psychosis. ?titration       -continue music therapy     -sleep wake chart ongoing    -Patient demonstrating some improvements in behavior as well as impulsivity.  Now in low bed11. H/o polysubstance abuse: Educate on cessation when appropriate.  12.  Non specific complaints with normal exam and vitals will cont to monitor  LOS (Days) 24 A FACE TO FACE EVALUATION WAS PERFORMED  Erick Colace, MD 06/30/2017 9:48 AM

## 2017-06-30 NOTE — Progress Notes (Signed)
Occupational Therapy Session Note  Patient Details  Name: Patrick [name] MRN: [id number] Date of Birth: November 21, 1965  Today's Date: 06/30/2017 OT Individual Time: [id number] OT Individual Time Calculation (min): 58 min  Short Term Goals: Week 3:  OT Short Term Goal 1 (Week 3): Pt will complete 1 grooming task in standing with min vcs for sequencing and attention  OT Short Term Goal 2 (Week 3): Pt will complete LB dressing with supervision  OT Short Term Goal 3 (Week 3): Pt will bathe in shower with less than 10 minutes to initiate bathing activity   Skilled Therapeutic Interventions/Progress Updates:    Pt greeted supine in bed, agreeable to tx. 7 minutes for him to initiate stand and gather clothing items in room in prep for shower. He ambulated around room and into bathroom without AD at Min A level. [facility], and then transferred to TTB. Pt bathing at sit<stand level with steady assist overall. Min cues for sequencing. He initiated using R UE to bathe however c/o pain with use. After he dressed on BSC beside toilet with cues for sustained attention, due to perseveration on "snake bites" on skin which appear to be freckles. Once he was dressed, pt ambulated to toilet to urinate (no foul odor observed today). Oral care completed while standing at sink while integrating bilateral UEs. Filled out his memory journal with max cues for recalling events of today's tx and updated calendar. Pt then returned to supine and was left with all needs within reach and bed alarm activated.    Oriented to time, place, and situation today with min vcs.   Therapy Documentation Precautions:  Precautions Precautions: Fall Precaution Comments: TBI/ do not push on center of forehead / depressed skull fx  Required Braces or Orthoses: Sling Restrictions Weight Bearing Restrictions: Yes RUE Weight Bearing: Non weight bearing RLE Weight Bearing: Weight bearing as  tolerated  Pain: with R UE movement  Pain Assessment Pain Score: 0-No pain ADL:      See Function Navigator for Current Functional Status.   Therapy/Group: Individual Therapy  Patrick [name] A [name] 06/30/2017, 12:55 PM

## 2017-07-01 NOTE — Progress Notes (Signed)
PHYSICAL MEDICINE & REHABILITATION     PROGRESS NOTE    Subjective/Complaints: Per OT notes pt confusing his freckles with snake bites Right forearm pain this am improved after pain med, no new falls or trauma  ROS: pt denies nausea, vomiting, diarrhea, cough, shortness of breath or chest pain  Objective: Vital Signs: Blood pressure (!) 90/58, pulse 76, temperature 98.9 F (37.2 C), temperature source Oral, resp. rate 18, height 6' (1.829 m), weight 70 kg (154 lb 5.2 oz), SpO2 98 %. No results found. No results for input(s): WBC, HGB, HCT, PLT in the last 72 hours. No results for input(s): NA, K, CL, GLUCOSE, BUN, CREATININE, CALCIUM in the last 72 hours.  Invalid input(s): CO CBG (last 3)  No results for input(s): GLUCAP in the last 72 hours.  Wt Readings from Last 3 Encounters:  06/13/17 70 kg (154 lb 5.2 oz)  06/02/17 69.8 kg (153 lb 14.1 oz)    Physical Exam:  Constitutional: NAD. Vital signs reviewed. HENT: Depressed area forehead Eyes: Eyes open.  No areas of drainage or irritation Cardiovascular: RRR without murmur. No JVD       Respiratory: CTA Bilaterally without wheezes or rales. Normal effort      GI: Bowel sounds are normal. He exhibits no distension.  Musculoskeletal: He exhibits no edema or tenderness in all extremities.  Limited movement right upper extremity Right forearm incison well healed mild tenderness to palpation Neurological: improved attention. Follows commands more directly now Skin: Warm and dry. Intact. Psychiatric: calmer.  Still distractible but redirectable today  Assessment/Plan: 1.  Visual and cognitive deficits as well as balance problems secondary to polytrauma and TBI which require 3+ hours per day of interdisciplinary therapy in a comprehensive inpatient rehab setting. Physiatrist is providing close team supervision and 24 hour management of active medical problems listed below. Physiatrist and rehab team continue to assess  barriers to discharge/monitor patient progress toward functional and medical goals.  Function:  Bathing Bathing position   Position: Shower  Bathing parts Body parts bathed by patient: Chest, Abdomen, Front perineal area, Buttocks, Right arm, Left arm, Left lower leg, Right upper leg, Left upper leg, Right lower leg Body parts bathed by helper: Back  Bathing assist Assist Level: Touching or steadying assistance(Pt > 75%)      Upper Body Dressing/Undressing Upper body dressing   What is the patient wearing?: Pull over shirt/dress     Pull over shirt/dress - Perfomed by patient: Thread/unthread right sleeve, Thread/unthread left sleeve, Put head through opening, Pull shirt over trunk Pull over shirt/dress - Perfomed by helper: Thread/unthread right sleeve, Put head through opening        Upper body assist Assist Level: Supervision or verbal cues, Set up   Set up : To obtain clothing/put away  Lower Body Dressing/Undressing Lower body dressing   What is the patient wearing?: Pants, Non-skid slipper socks     Pants- Performed by patient: Thread/unthread right pants leg, Thread/unthread left pants leg, Pull pants up/down Pants- Performed by helper: Thread/unthread right pants leg, Thread/unthread left pants leg, Pull pants up/down Non-skid slipper socks- Performed by patient: Don/doff left sock, Don/doff right sock Non-skid slipper socks- Performed by helper: Don/doff right sock, Don/doff left sock             TED Hose - Performed by patient: Don/doff right TED hose, Don/doff left TED hose TED Hose - Performed by helper: Don/doff right TED hose, Don/doff left TED hose  Lower body assist  Assist for lower body dressing: Touching or steadying assistance (Pt > 75%)   Set up : To obtain clothing/put away  Toileting Toileting Toileting activity did not occur: No continent bowel/bladder event Toileting steps completed by patient: Adjust clothing prior to toileting, Performs  perineal hygiene, Adjust clothing after toileting Toileting steps completed by helper: Adjust clothing prior to toileting, Performs perineal hygiene, Adjust clothing after toileting Toileting Assistive Devices: Grab bar or rail  Toileting assist Assist level: Supervision or verbal cues   Transfers Chair/bed transfer Chair/bed transfer activity did not occur: Safety/medical concerns Chair/bed transfer method: Ambulatory Chair/bed transfer assist level: Supervision or verbal cues Chair/bed transfer assistive device: Other(HHA)     Locomotion Ambulation     Max distance: >150 ft  Assist level: Touching or steadying assistance (Pt > 75%)   Wheelchair Wheelchair activity did not occur: Safety/medical concerns        Cognition Comprehension Comprehension assist level: Understands basic 90% of the time/cues < 10% of the time  Expression Expression assist level: Expresses basic 75 - 89% of the time/requires cueing 10 - 24% of the time. Needs helper to occlude trach/needs to repeat words.  Social Interaction Social Interaction assist level: Interacts appropriately 75 - 89% of the time - Needs redirection for appropriate language or to initiate interaction.  Problem Solving Problem solving assist level: Solves basic 50 - 74% of the time/requires cueing 25 - 49% of the time  Memory Memory assist level: Recognizes or recalls 50 - 74% of the time/requires cueing 25 - 49% of the time   Medical Problem List and Plan: 1.  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  DVT Prophylaxis/Anticoagulation: Pharmaceutical: Lovenox   Vascular study negative for DVT 3. Pain Management: Hold oxycodone to improve daytime arousal.  Use tramadol for  breakthrough pain or tylenol prn depending of pain intensity. 4. Mood: Team to provide ego support to continue help manage anxiety. LCSW to follow for evaluation and support when appropriate.    -Music therapy helpful 5. Neuropsych: This patient is not capable of making decisions on his ownbehalf.   -Continue Ritalin  15 mg for attention and focus  6. Skin/Wound Care: Pressure relief measures 7. Fluids/Electrolytes/Nutrition: I/Os- I 580ml, enc po fluids 8. Mild hyponatremia:    Sodium 139 on 06/19/2017. 9.  Leucocytosis: Resolved   WBCs decreased to 5.5 on 06/19/2017 10. Agitation/emotional dyscontrol: Continue scheduled valproic acid, increased to 500 mg 3 times daily    -  valproic acid level 82 . -Continue Seroquel at night for sleep.     -Risperdal 0.5 mg twice daily appears to have helped with daytime agitation/psychosis. ?titration       -continue music therapy     -sleep wake chart ongoing    -Patient demonstrating some improvements in behavior as well as impulsivity.  Now in low bed11. H/o polysubstance abuse: Educate on cessation when appropriate.  12.  Right radioulnar fracture, still has some soreness ~ 6  wks post op but no concerns at this time LOS (Days) 25 A FACE TO FACE EVALUATION WAS PERFORMED  Erick ColaceAndrew E Zameria Vogl, MD 07/01/2017 10:04 AM

## 2017-07-02 ENCOUNTER — Inpatient Hospital Stay (HOSPITAL_COMMUNITY): Payer: Self-pay | Admitting: Occupational Therapy

## 2017-07-02 ENCOUNTER — Inpatient Hospital Stay (HOSPITAL_COMMUNITY): Payer: Self-pay | Admitting: Physical Therapy

## 2017-07-02 ENCOUNTER — Inpatient Hospital Stay (HOSPITAL_COMMUNITY): Payer: No Typology Code available for payment source | Admitting: Occupational Therapy

## 2017-07-02 ENCOUNTER — Inpatient Hospital Stay (HOSPITAL_COMMUNITY): Payer: No Typology Code available for payment source | Admitting: Physical Therapy

## 2017-07-02 ENCOUNTER — Inpatient Hospital Stay (HOSPITAL_COMMUNITY): Payer: No Typology Code available for payment source | Admitting: Speech Pathology

## 2017-07-02 LAB — URINALYSIS, ROUTINE W REFLEX MICROSCOPIC
Bilirubin Urine: NEGATIVE
Glucose, UA: NEGATIVE mg/dL
Hgb urine dipstick: NEGATIVE
KETONES UR: 5 mg/dL — AB
Nitrite: NEGATIVE
PH: 5 (ref 5.0–8.0)
Protein, ur: NEGATIVE mg/dL
Specific Gravity, Urine: 1.027 (ref 1.005–1.030)

## 2017-07-02 MED ORDER — RISPERIDONE 0.5 MG PO TABS
0.5000 mg | ORAL_TABLET | Freq: Three times a day (TID) | ORAL | Status: DC
  Administered 2017-07-02 – 2017-07-06 (×12): 0.5 mg via ORAL
  Filled 2017-07-02 (×13): qty 1

## 2017-07-02 NOTE — Progress Notes (Signed)
Speech Language Pathology Daily Session Note  Patient Details  Name: Patrick [name] MRN: [id number] Date of Birth: December 06, 1965  Today's Date: 07/02/2017 SLP Individual Time: 1030-1100 SLP Individual Time Calculation (min): 30 min  Short Term Goals: Week 4: SLP Short Term Goal 1 (Week 4): Patient will demonstrate selective attention in a mildly distractive environment for 60 minutes with Mod A verbal cues for redirection. SLP Short Term Goal 2 (Week 4): Patient will recall funtional information with Mod A verbal and visual cues. SLP Short Term Goal 3 (Week 4): Patient will demonstrate functional problem solving with basic and familiar tasks with Mod A verbal cues.  SLP Short Term Goal 4 (Week 4): Patient will demonstrate emergent awareness by self-monitoring and correcting errors during functional tasks with Mod A verbal cues.   Skilled Therapeutic Interventions: Skilled treatment session focused on family education. SLP facilitated session by providing education the patient and his family (2 daughters, sister and friend) in regards to his current cognitive functioning, importance of 24 hour supervision and strategies to utilize to maximize recall, independence and overall safety at home. All verbalized understanding of information and information will be reinforced during family conference.  Patient left supine in bed with alarm on. Continue with current plan of care.      Function:   Cognition Comprehension Comprehension assist level: Understands basic 75 - 89% of the time/ requires cueing 10 - 24% of the time  Expression   Expression assist level: Expresses basic 75 - 89% of the time/requires cueing 10 - 24% of the time. Needs helper to occlude trach/needs to repeat words.  Social Interaction Social Interaction assist level: Interacts appropriately 75 - 89% of the time - Needs redirection for appropriate language or to initiate interaction.  Problem Solving Problem solving assist  level: Solves basic 75 - 89% of the time/requires cueing 10 - 24% of the time  Memory Memory assist level: Recognizes or recalls 75 - 89% of the time/requires cueing 10 - 24% of the time    Pain No/Denies Pain  Therapy/Group: Individual Therapy  Patrick [name] 07/02/2017, 1:14 PM

## 2017-07-02 NOTE — Progress Notes (Signed)
Occupational Therapy Session Note  Patient Details  Name: Patrick [name] MRN: [id number] Date of Birth: Jul 15, 1965  Today's Date: 07/02/2017 OT Individual Time: 0900-1000 OT Individual Time Calculation (min): 60 min   Skilled Therapeutic Interventions/Progress Updates:    Pt greeted supine in bed. Agreeable to shower. After 15 minutes to initiate standing with max vcs, pt ambulated in room to gather ADL items, then proceeded into bathroom and transferred to TTB with supervision. Pt bathing with min vcs for sequencing and sustained attention due to perseveration on skin. Continue to educate pt that marks on skin are freckles and not snake bites. Pt dressing at sit<stand level on BSC near toilet with cues to maintain attention to tasks. He then ambulated into room and brushed teeth while standing at sink. Recorded today's OT events in memory journal with min vcs. He then transitioned to supine and we positioned R UE in extension (per his tolerance) with ice pack applied. Pt left with all needs and bed alarm activated.   Pt oriented x4 this session!   Therapy Documentation Precautions:  Precautions Precautions: Fall Precaution Comments: TBI/ do not push on center of forehead / depressed skull fx  Required Braces or Orthoses: Sling Restrictions Weight Bearing Restrictions: Yes RUE Weight Bearing: Non weight bearing RLE Weight Bearing: Weight bearing as tolerated Pain: in bilateral UEs. RN made aware.  Pain Assessment Pain Assessment: 0-10 Pain Score: 5  Pain Type: Acute pain Pain Location: Leg Pain Orientation: Right Pain Descriptors / Indicators: Aching Pain Frequency: Intermittent Pain Onset: With Activity Pain Intervention(s): Medication (See eMAR) ADL:      See Function Navigator for Current Functional Status.   Therapy/Group: Individual Therapy  Patrick [name] 07/02/2017, 12:44 PM

## 2017-07-02 NOTE — Progress Notes (Signed)
[place] PHYSICAL MEDICINE & REHABILITATION     PROGRESS NOTE    Subjective/Complaints: In bed. Still impulsive and tries to get up on his own at times  ROS: Limited due to cognitive/behavioral      Objective: Vital Signs: Blood pressure 103/64, pulse 73, temperature 98.9 F (37.2 C), temperature source Oral, resp. rate 18, height 6' (1.829 m), weight 70 kg (154 lb 5.2 oz), SpO2 98 %. No results found. No results for input(s): WBC, HGB, HCT, PLT in the last 72 hours. No results for input(s): NA, K, CL, GLUCOSE, BUN, CREATININE, CALCIUM in the last 72 hours.  Invalid input(s): CO CBG (last 3)  No results for input(s): GLUCAP in the last 72 hours.  Wt Readings from Last 3 Encounters:  06/13/17 70 kg (154 lb 5.2 oz)  06/02/17 69.8 kg (153 lb 14.1 oz)    Physical Exam:  Constitutional: NAD. Vital signs reviewed. HENT: Depressed area forehead Eyes: Eyes open.  No areas of drainage or irritation Cardiovascular: RRR without murmur. No JVD       Respiratory: CTA Bilaterally without wheezes or rales. Normal effort     GI: Bowel sounds are normal. He exhibits no distension.  Musculoskeletal: He exhibits no edema or tenderness in all extremities.  Limited movement right upper extremity Right forearm incison well healed mild tenderness to palpation Neurological: improved attention. Follows commands more directly now Skin: Warm and dry. Intact. Psychiatric: calmer.  Still distractible but redirectable today  Assessment/Plan: 1.  Visual and cognitive deficits as well as balance problems secondary to polytrauma and TBI which require 3+ hours per day of interdisciplinary therapy in a comprehensive inpatient rehab setting. Physiatrist is providing close team supervision and 24 hour management of active medical problems listed below. Physiatrist and rehab team continue to assess barriers to discharge/monitor patient progress toward functional and medical  goals.  Function:  Bathing Bathing position   Position: Shower  Bathing parts Body parts bathed by patient: Chest, Abdomen, Front perineal area, Buttocks, Right arm, Left arm, Left lower leg, Right upper leg, Left upper leg, Right lower leg Body parts bathed by helper: Back  Bathing assist Assist Level: Touching or steadying assistance(Pt > 75%)      Upper Body Dressing/Undressing Upper body dressing   What is the patient wearing?: Pull over shirt/dress     Pull over shirt/dress - Perfomed by patient: Thread/unthread right sleeve, Thread/unthread left sleeve, Put head through opening, Pull shirt over trunk Pull over shirt/dress - Perfomed by helper: Thread/unthread right sleeve, Put head through opening        Upper body assist Assist Level: Supervision or verbal cues, Set up   Set up : To obtain clothing/put away  Lower Body Dressing/Undressing Lower body dressing   What is the patient wearing?: Pants, Non-skid slipper socks     Pants- Performed by patient: Thread/unthread right pants leg, Thread/unthread left pants leg, Pull pants up/down Pants- Performed by helper: Thread/unthread right pants leg, Thread/unthread left pants leg, Pull pants up/down Non-skid slipper socks- Performed by patient: Don/doff left sock, Don/doff right sock Non-skid slipper socks- Performed by helper: Don/doff right sock, Don/doff left sock             TED Hose - Performed by patient: Don/doff right TED hose, Don/doff left TED hose TED Hose - Performed by helper: Don/doff right TED hose, Don/doff left TED hose  Lower body assist Assist for lower body dressing: Touching or steadying assistance (Pt > 75%)   Set up :  To obtain clothing/put away  Toileting Toileting Toileting activity did not occur: No continent bowel/bladder event Toileting steps completed by patient: Adjust clothing prior to toileting, Performs perineal hygiene, Adjust clothing after toileting Toileting steps completed by  helper: Adjust clothing prior to toileting, Performs perineal hygiene, Adjust clothing after toileting Toileting Assistive Devices: Grab bar or rail  Toileting assist Assist level: Supervision or verbal cues   Transfers Chair/bed transfer Chair/bed transfer activity did not occur: Safety/medical concerns Chair/bed transfer method: Ambulatory Chair/bed transfer assist level: Supervision or verbal cues Chair/bed transfer assistive device: Other(HHA)     Locomotion Ambulation     Max distance: >150 ft  Assist level: Touching or steadying assistance (Pt > 75%)   Wheelchair Wheelchair activity did not occur: Safety/medical concerns        Cognition Comprehension Comprehension assist level: Understands basic 75 - 89% of the time/ requires cueing 10 - 24% of the time  Expression Expression assist level: Expresses basic 75 - 89% of the time/requires cueing 10 - 24% of the time. Needs helper to occlude trach/needs to repeat words.  Social Interaction Social Interaction assist level: Interacts appropriately 75 - 89% of the time - Needs redirection for appropriate language or to initiate interaction.  Problem Solving Problem solving assist level: Solves basic 75 - 89% of the time/requires cueing 10 - 24% of the time  Memory Memory assist level: Recognizes or recalls 75 - 89% of the time/requires cueing 10 - 24% of the time   Medical Problem List and Plan: 1.  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  DVT Prophylaxis/Anticoagulation: Pharmaceutical: Lovenox   Vascular study negative for DVT 3. Pain Management: Hold oxycodone to improve daytime arousal.  Use tramadol for breakthrough pain or tylenol prn depending of pain  intensity. 4. Mood: Team to provide ego support to continue help manage anxiety. LCSW to follow for evaluation and support when appropriate.    -Music therapy helpful 5. Neuropsych: This patient is not capable of making decisions on his ownbehalf.   -Continue Ritalin  15 mg for attention and focus  6. Skin/Wound Care: Pressure relief measures 7. Fluids/Electrolytes/Nutrition: I/Os- enc po. Eating well!   -recheck labst his week 8. Mild hyponatremia:    Sodium 139 on 06/19/2017. 9.  Leucocytosis: Resolved   WBCs decreased to 5.5 on 06/19/2017 10. Agitation/emotional dyscontrol: Continue scheduled valproic acid, increased to 500 mg 3 times daily    -  valproic acid level 82 . -Continue Seroquel at night for sleep.     -Risperdal 0.5 mg twice daily appears to have helped with daytime agitation/psychosis. ?titration       -continue music therapy     -sleep wake chart ongoing    -Patient demonstrating some improvements in behavior as well as impulsivity.  Now in low bed11. H/o polysubstance abuse: Educate on cessation when appropriate.  12.  Right radioulnar fracture, still has some soreness ~ 6  wks post op but no concerns at this time--encouraged use of RUE as tolerated.    LOS (Days) 26 A FACE TO FACE EVALUATION WAS PERFORMED  Ranelle OysterSWARTZ,Easter Kennebrew T, MD 07/02/2017 9:11 AM

## 2017-07-02 NOTE — Progress Notes (Signed)
Therapy with family and concerned that pt has UTI d/t odor of urine. Will inform PA of concern.
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
## 2017-07-02 NOTE — Progress Notes (Signed)
Physical Therapy Session Note  Patient Details  Name: Patrick [name] MRN: [id number] Date of Birth: Dec 23, 1965  Today's Date: 07/02/2017 PT Individual Time: [id number] and 1535-1630 PT Individual Time Calculation (min): 27 min and 55 min   Short Term Goals: Week 4:  PT Short Term Goal 1 (Week 4): Pt will demonstrate sustained attention for 5 minutes in a minimally distracting environment with min cuing. PT Short Term Goal 2 (Week 4): Pt will demonstrate emergent awareness with min cuing.  PT Short Term Goal 3 (Week 4): Pt will complete floor transfer while maintaining weight bearing precautions with mod cuing.  Skilled Therapeutic Interventions/Progress Updates:  Treatment 1: Pt received in bed & agreeable to tx. Pt with c/o pain along throat/neck & RN notified. Pt donned paper scrub pants & shirt with set up assist. Pt completed bed mobility at supervision level. Pt ambulated throughout unit without AD with supervision with occasional LOB to R with steady assist to recover. Pt engaged in conversation and was able to correctly recall watching superbowl last night, as well as other things he saw on television. Pt with more appropriate behaviors on this date. At end of session pt returned to room & therapist completed memory journal entry. Pt left in bed with alarm set & all needs within reach, reviewed use of call bell with pt.  Treatment 2: Pt received in bed & agreeable to tx, without c/o pain. Pt ambulated throughout unit without AD & supervision - when pt is distracted pt with impaired balance & requires steady/min assist to correct. Pt completed backwards walking without UE support and braiding with LUE support all with steady assist with task focusing on balance. Pt assembled linear puzzle with min cuing to recognize error then pt able to self correct. Pt utilized cybex kinetron in standing with LUE support and supervision then without UE support and min assist with task focusing on BLE  strengthening, weight shifting, and NMR. Pt required extra time and cuing to locate dayroom as [facility]. At end of session pt returned to room & therapist completed memory journal entry - pt able to recall 50% of activities from this session. Pt reported need to use restroom & had continent void & BM on toilet. Pt performed peri hygiene and hand hygiene with supervision overall and cuing to locate soap when standing at sink. Pt left in bed with alarm set & all needs within reach.   Therapy Documentation Precautions:  Precautions Precautions: Fall Precaution Comments: TBI/ do not push on center of forehead / depressed skull fx  Required Braces or Orthoses: Sling Restrictions Weight Bearing Restrictions: Yes RUE Weight Bearing: Non weight bearing RLE Weight Bearing: Weight bearing as tolerated   See Function Navigator for Current Functional Status.   Therapy/Group: Individual Therapy  Sandi MariscalVictoria M Alejandra Barna 07/02/2017, 4:41 PM

## 2017-07-02 NOTE — Progress Notes (Signed)
Social Work Patient ID: Van Clinesoyle Dean [name], male   DOB: [birth year], 52 y.o.   MRN: [id number]   Patient/Family Conference  Patient/family in attendance: Pt's daughter, Herbert SetaHeather and her sister, Jeanice LimHolly and mother, Clydie BraunKaren plus pt's sister, Beverlee NimsDoretta.  Staff in attendance: Feliberto Gottronourtney Payne, SLP                                 Jackquline DenmarkKatie Bradsher, OT                                 Aleda GranaVictoria Miller, PT                                 [place]cy B[place], [place]   Main focus:  To provide education to family in preparation for pt's d/c home.  Synopsis of information shared:  Review pt's current functional levels and strategies for providing adequate 24/7 support at home.   Barriers/concerns expressed by patient and family: No concerns/ barrriers.  Patient/family response: All very pleased with education and strategies provided and report they are feeling ready for pt's d/c end of this week.  Follow-up/action plans: Need to complete "hands on" education with all prior to d/c.   Ladawn Boullion, LCSW

## 2017-07-02 NOTE — Progress Notes (Signed)
Occupational Therapy Session Note  Patient Details  Name: Patrick [name] [name] MRN: [id number] Date of Birth: Oct 16, 1965  Today's Date: 07/02/2017 OT Individual Time: 1130-1200 OT Individual Time Calculation (min): 30 min    Short Term Goals: Week 3:  OT Short Term Goal 1 (Week 3): Pt will complete 1 grooming task in standing with min vcs for sequencing and attention  OT Short Term Goal 2 (Week 3): Pt will complete LB dressing with supervision  OT Short Term Goal 3 (Week 3): Pt will bathe in shower with less than 10 minutes to initiate bathing activity   Skilled Therapeutic Interventions/Progress Updates:    Upon entering the room, pt supine in bed with family present for continued hands on training family education. Pt agreeable to OT intervention and with no c/o pain this session. Pt ambulates with caregivers with overall close supervision for safety into dayroom where pt takes seated rest break. OT educating family members on recommendations for home safety such as low bed, removing fall risks, using baby monitor system, and toileting schedule for incontinence concerns. Caregivers verbalized understanding and asked questions as needed. Pt able to navigate path without directional cuing back to room with caregivers providing supervision with min cues for safety and no use of AD. Pt returning to sit on EOB with meal present. Call bell and all needed items within reach with family and RN present. OT checking off family members Herbert Seta([name], Clydie BraunKaren, and CooleemeeHolly) to ambulate with pt in room and to bathroom.    Therapy Documentation Precautions:  Precautions Precautions: Fall Precaution Comments: TBI/ do not push on center of forehead / depressed skull fx  Required Braces or Orthoses: Sling Restrictions Weight Bearing Restrictions: Yes RUE Weight Bearing: Non weight bearing RLE Weight Bearing: Weight bearing as tolerated General:   Vital Signs:   Pain: Pain Assessment Pain Assessment:  0-10 Pain Score: 5  Pain Type: Acute pain Pain Location: Leg Pain Orientation: Right Pain Descriptors / Indicators: Aching Pain Frequency: Intermittent Pain Onset: With Activity Pain Intervention(s): Medication (See eMAR)  See Function Navigator for Current Functional Status.   Therapy/Group: Individual Therapy  Alen BleacherBradsher, Saiquan Hands P 07/02/2017, 12:33 PM

## 2017-07-03 ENCOUNTER — Inpatient Hospital Stay (HOSPITAL_COMMUNITY): Payer: No Typology Code available for payment source | Admitting: Occupational Therapy

## 2017-07-03 ENCOUNTER — Inpatient Hospital Stay (HOSPITAL_COMMUNITY): Payer: No Typology Code available for payment source | Admitting: Speech Pathology

## 2017-07-03 ENCOUNTER — Inpatient Hospital Stay (HOSPITAL_COMMUNITY): Payer: No Typology Code available for payment source | Admitting: Physical Therapy

## 2017-07-03 LAB — CBC
HCT: 38.5 % — ABNORMAL LOW (ref 39.0–52.0)
Hemoglobin: 12.3 g/dL — ABNORMAL LOW (ref 13.0–17.0)
MCH: 28.2 pg (ref 26.0–34.0)
MCHC: 31.9 g/dL (ref 30.0–36.0)
MCV: 88.3 fL (ref 78.0–100.0)
PLATELETS: 374 10*3/uL (ref 150–400)
RBC: 4.36 MIL/uL (ref 4.22–5.81)
RDW: 14.3 % (ref 11.5–15.5)
WBC: 9.2 10*3/uL (ref 4.0–10.5)

## 2017-07-03 LAB — BASIC METABOLIC PANEL
Anion gap: 13 (ref 5–15)
BUN: 17 mg/dL (ref 6–20)
CHLORIDE: 101 mmol/L (ref 101–111)
CO2: 25 mmol/L (ref 22–32)
CREATININE: 0.94 mg/dL (ref 0.61–1.24)
Calcium: 8.9 mg/dL (ref 8.9–10.3)
GFR calc non Af Amer: >60 mL/min (ref >60)
Glucose, Bld: 94 mg/dL (ref 65–99)
Potassium: 4.2 mmol/L (ref 3.5–5.1)
SODIUM: 139 mmol/L (ref 135–145)

## 2017-07-03 LAB — HEPATIC FUNCTION PANEL
ALT: 10 U/L — AB (ref 17–63)
AST: 15 U/L (ref 15–41)
Albumin: 2.9 g/dL — ABNORMAL LOW (ref 3.5–5.0)
Alkaline Phosphatase: 120 U/L (ref 38–126)
Bilirubin, Direct: <0.1 mg/dL — ABNORMAL LOW (ref 0.1–0.5)
TOTAL PROTEIN: 6.1 g/dL — AB (ref 6.5–8.1)
Total Bilirubin: 0.3 mg/dL (ref 0.3–1.2)

## 2017-07-03 NOTE — Progress Notes (Signed)
Speech Language Pathology Daily Session Note  Patient Details  Name: Patrick [name] [name] MRN: [id number] Date of Birth: Mar 12, 1966  Today's Date: 07/03/2017 SLP Individual Time: 0930-1030 SLP Individual Time Calculation (min): 60 min  Short Term Goals: Week 4: SLP Short Term Goal 1 (Week 4): Patient will demonstrate selective attention in a mildly distractive environment for 60 minutes with Mod A verbal cues for redirection. SLP Short Term Goal 2 (Week 4): Patient will recall funtional information with Mod A verbal and visual cues. SLP Short Term Goal 3 (Week 4): Patient will demonstrate functional problem solving with basic and familiar tasks with Mod A verbal cues.  SLP Short Term Goal 4 (Week 4): Patient will demonstrate emergent awareness by self-monitoring and correcting errors during functional tasks with Mod A verbal cues.   Skilled Therapeutic Interventions: Skilled treatment session focused on cognitive goals. Upon arrival, patient was supine in bed and required more than a reasonable amount of time to sit EOB. SLP facilitated session by providing overall Min A verbal cues and extra time for problem solving during mildly complex money management, medication management and reading comprehension tasks. However, patient required Mod A verbal cues for selective attention to tasks in a mildly distracting enviornment. Patient left upright in chair with RN present. Continue with current plan of care.      Function:  Cognition Comprehension Comprehension assist level: Understands basic 75 - 89% of the time/ requires cueing 10 - 24% of the time  Expression   Expression assist level: Expresses basic 75 - 89% of the time/requires cueing 10 - 24% of the time. Needs helper to occlude trach/needs to repeat words.  Social Interaction Social Interaction assist level: Interacts appropriately 75 - 89% of the time - Needs redirection for appropriate language or to initiate interaction.  Problem  Solving Problem solving assist level: Solves basic 75 - 89% of the time/requires cueing 10 - 24% of the time  Memory Memory assist level: Recognizes or recalls 75 - 89% of the time/requires cueing 10 - 24% of the time    Pain Pain Assessment Pain Assessment: No/denies pain  Therapy/Group: Individual Therapy  Kaliel Bolds 07/03/2017, 3:19 PM
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
## 2017-07-03 NOTE — Progress Notes (Signed)
Occupational Therapy Session Note  Patient Details  Name: Patrick [name] MRN: [id number] Date of Birth: [birth year]  Today's Date: 07/03/2017 OT Individual Time: [id number] and 1530-1600 OT Individual Time Calculation (min): 75 min and 30 min    Short Term Goals: Week 3:  OT Short Term Goal 1 (Week 3): Pt will complete 1 grooming task in standing with min vcs for sequencing and attention  OT Short Term Goal 2 (Week 3): Pt will complete LB dressing with supervision  OT Short Term Goal 3 (Week 3): Pt will bathe in shower with less than 10 minutes to initiate bathing activity   Skilled Therapeutic Interventions/Progress Updates:    Session 1: Upon entering the room, pt seated on EOB eating breakfast. Pt utilized B hands to cut meat and open packages. Pt feeding self with R UE with no c/o pain this session. Pt utilizing wall calendar to correctly state date,  Day of week, discharge date, and how many days until discharge with min verbal guidance cues. Pt declined bathing and dressing this session. Pt discussing recent sports events and recounting scores of super bowl and describing events of game correctly this session. OT and pt also discussing plans for discharge with pt verbalizing understanding. Pt remaining in bed with bed alarm activated and call bell within reach upon exiting the room.   Session 2: Upon entering the room, pt seated on EOB awaiting therapist arrival. Pt telling therapist, "We have to write what we do during this time in that book (pointing to memory book)". Pt also showing increased safety awareness by waiting for therapist to turn off bed alarm before attempting to stand from bed. Pt ambulating to dresser to obtain snack from family members with supervision for safety. Pt declined toileting this session. Pt ambulating to [employer] gym for dynavision task. OT demonstrated task and pt standing and completing dynavision task for 2 minutes of sustained attention with limited  distractions and able to hit 101 targets with 1.19 seconds reaction time. Pt returning to room at end of session with call bell and all needed items within reach. Bed alarm activated.   Therapy Documentation Precautions:  Precautions Precautions: Fall Precaution Comments: TBI/ do not push on center of forehead / depressed skull fx  Required Braces or Orthoses: Sling Restrictions Weight Bearing Restrictions: Yes RUE Weight Bearing: Non weight bearing RLE Weight Bearing: Weight bearing as tolerated   See Function Navigator for Current Functional Status.   Therapy/Group: Individual Therapy  Alen BleacherBradsher, Eyoel Throgmorton P 07/03/2017, 12:40 PM
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
## 2017-07-03 NOTE — Progress Notes (Signed)
Physical Therapy Session Note  Patient Details  Name: Patrick [name] MRN: [id number] Date of Birth: 1965-08-25  Today's Date: 07/03/2017 PT Individual Time: 1037-1202 PT Individual Time Calculation (min): 85 min   Short Term Goals: Week 4:  PT Short Term Goal 1 (Week 4): Pt will demonstrate sustained attention for 5 minutes in a minimally distracting environment with min cuing. PT Short Term Goal 2 (Week 4): Pt will demonstrate emergent awareness with min cuing.  PT Short Term Goal 3 (Week 4): Pt will complete floor transfer while maintaining weight bearing precautions with mod cuing.  Skilled Therapeutic Interventions/Progress Updates:  Pt received in room & agreeable to tx. Pt with c/o pain in R leg with sit>stand that caused LOB but pt able to self correct. Pt able to recall some of previous SLP session and completed entry in memory journal with max cuing to attend to task. Pt ambulated throughout unit without AD & supervision with cuing for increased attention as pt with decreased balance when distracted. Pt completed bed mobility & transfer from low, compliant couch with supervision. Pt able to complete floor transfer (sitting on floor>couch) with supervision and mod cuing for NWB RUE. Provided education regarding NWB and to maintain this upon d/c. Pt changed pillow cases with supervision with BUE with task focusing on increasing functional use of RUE. Pt able to locate dayroom with less cuing on this date. Pt engaged in small pipe tree assembly, correctly assembling 3 simple shapes with max instructional cuing fade to supervision. Pt negotiated ramp & mulch with close supervision, & curb without AD with min assist on this date. Pt reports being uncomfortable with negotiating curb without UE support 2/2 impaired balance. During conversation pt able to report need to improve balance prior to d/c. Pt engaged in card matching game with moderate cuing to attend to task as pt continues to be  internally distracted. Pt requires max assist for short term memory to make correct matches. At end of session pt returned to room & therapist completed memory journal entry. Reviewed use of call bell with pt. Pt left sitting on EOB with bed alarm set & set up with lunch tray.   Pt with improving functional use and slightly improving ROM (pronation) with RUE on this date.  Therapy Documentation Precautions:  Precautions Precautions: Fall Precaution Comments: TBI/ do not push on center of forehead / depressed skull fx  Required Braces or Orthoses: Sling Restrictions Weight Bearing Restrictions: Yes RUE Weight Bearing: Non weight bearing RLE Weight Bearing: Weight bearing as tolerated   See Function Navigator for Current Functional Status.   Therapy/Group: Individual Therapy  Sandi MariscalVictoria M Tekisha Darcey 07/03/2017, 12:21 PM

## 2017-07-04 ENCOUNTER — Inpatient Hospital Stay (HOSPITAL_COMMUNITY): Payer: No Typology Code available for payment source | Admitting: Speech Pathology

## 2017-07-04 ENCOUNTER — Inpatient Hospital Stay (HOSPITAL_COMMUNITY): Payer: No Typology Code available for payment source | Admitting: Physical Therapy

## 2017-07-04 ENCOUNTER — Inpatient Hospital Stay (HOSPITAL_COMMUNITY): Payer: No Typology Code available for payment source | Admitting: Occupational Therapy

## 2017-07-04 ENCOUNTER — Inpatient Hospital Stay (HOSPITAL_COMMUNITY): Payer: Self-pay | Admitting: Occupational Therapy

## 2017-07-04 NOTE — Progress Notes (Signed)
Social Work Patient ID: Patrick [name] [name], male   DOB: 1965-09-21, 52 y.o.   MRN: [id number]  Have reviewed team conference with pt and family and all feeling ready for d/c end of week.  Discussed follow up therapies [facility]  Pt to discharge home with daughter, Patrick [name], who will be primary caregiver along with assist of other family to cover 24/7 supervision.  Continue to follow.  Rogena Deupree, LCSW
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
## 2017-07-04 NOTE — Progress Notes (Signed)
Speech Language Pathology Daily Session Note  Patient Details  Name: Patrick [name] MRN: [id number] Date of Birth: [birth year]  Today's Date: 07/04/2017 SLP Individual Time: [id number] SLP Individual Time Calculation (min): 45 min  Short Term Goals: Week 4: SLP Short Term Goal 1 (Week 4): Patient will demonstrate selective attention in a mildly distractive environment for 60 minutes with Mod A verbal cues for redirection. SLP Short Term Goal 2 (Week 4): Patient will recall funtional information with Mod A verbal and visual cues. SLP Short Term Goal 3 (Week 4): Patient will demonstrate functional problem solving with basic and familiar tasks with Mod A verbal cues.  SLP Short Term Goal 4 (Week 4): Patient will demonstrate emergent awareness by self-monitoring and correcting errors during functional tasks with Mod A verbal cues.   Skilled Therapeutic Interventions: Skilled treatment session focused on cognitive goals. SLP facilitated session by providing extra time and Mod A verbal cues for initiation of standing. SLP further facilitated session by administering MOCA version 7.3. Pt scored 23 out of 30 points with a score of 26 and above considered normal. Although pt demonstrated deficits in attention and memory, pt's score increased 4 points compared to 6 days ago. Pt required Min A verbal cues for selective attention in a mildly distracting environment for ~35 minutes throughout task. Pt left supine in bed with alarm on and all needs within reach. Continue with current plan of care.    Function:  Cognition Comprehension Comprehension assist level: Understands basic 90% of the time/cues < 10% of the time  Expression   Expression assist level: Expresses basic 75 - 89% of the time/requires cueing 10 - 24% of the time. Needs helper to occlude trach/needs to repeat words.  Social Interaction Social Interaction assist level: Interacts appropriately 75 - 89% of the time - Needs redirection for  appropriate language or to initiate interaction.  Problem Solving Problem solving assist level: Solves basic 50 - 74% of the time/requires cueing 25 - 49% of the time  Memory Memory assist level: Recognizes or recalls 50 - 74% of the time/requires cueing 25 - 49% of the time    Pain No/denies pain  Therapy/Group: Individual Therapy  Roxan HockeyYahui Aaliyah Gavel  SLP - Student 07/04/2017, 3:42 PM
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
## 2017-07-05 ENCOUNTER — Inpatient Hospital Stay (HOSPITAL_COMMUNITY): Payer: No Typology Code available for payment source | Admitting: Physical Therapy

## 2017-07-05 ENCOUNTER — Inpatient Hospital Stay (HOSPITAL_COMMUNITY): Payer: No Typology Code available for payment source | Admitting: Occupational Therapy

## 2017-07-05 ENCOUNTER — Inpatient Hospital Stay (HOSPITAL_COMMUNITY): Payer: No Typology Code available for payment source | Admitting: Speech Pathology

## 2017-07-05 LAB — URINE CULTURE: Culture: >=100000 — AB

## 2017-07-05 MED ORDER — CEPHALEXIN 250 MG PO CAPS: 500.0000 mg | ORAL_CAPSULE | Freq: Three times a day (TID) | ORAL | Status: DC

## 2017-07-05 MED ORDER — NITROFURANTOIN MONOHYD MACRO 100 MG PO CAPS
100.0000 mg | ORAL_CAPSULE | Freq: Two times a day (BID) | ORAL | Status: DC
  Administered 2017-07-05 – 2017-07-06 (×3): 100 mg via ORAL
  Filled 2017-07-05 (×3): qty 1

## 2017-07-05 NOTE — Progress Notes (Signed)
Occupational Therapy Discharge Summary  Patient Details  Name: Patrick [name] MRN: [id number] Date of Birth: 03/13/1966  Today's Date: 07/05/2017 OT Individual Time: 1030-1200 OT Individual Time Calculation (min): 90 min    Patient has met 12 of 12 long term goals due to improved activity tolerance, improved balance, ability to compensate for deficits, improved attention, improved awareness and improved coordination.  Patient to discharge at overall Supervision level.  Patient's care partner is independent to provide the necessary physical and cognitive assistance at discharge.    Reasons goals not met: all goals met  Recommendation:  Patient will benefit from ongoing skilled OT services in home health setting to continue to advance functional skills in the area of BADL and iADL.  Equipment: No equipment provided  Reasons for discharge: treatment goals met  Patient/family agrees with progress made and goals achieved: Yes   OT Intervention: Upon entering the room, pt supine in bed with no c/o pain. Pt declined bathing and dressing this session. Pt taking 10 minutes to exit bed but showing good awareness but asking, " Did you turn off that (bed) alarm?" . Pt ambulating with supervision to sink without use of AD for grooming tasks in supervision. Pt required increased time and min verbal cues to complete task. Pt given room number to locate and able to find with min verbal guidance cues. Pt seated in day room for memory and attention task to play game of "call it". External environment is very loud and distracting this session. Pt not needing cuing for turn taking but required min cues to review paper of items already called as game progress and became more difficult. Pt overall following rules of game with min verbal guidance cues. Pt returning to room at end of session and lunch tray placed in front of him. RN present to give medications  And all needs within reach.   OT  Discharge Precautions/Restrictions  Precautions Precautions: Fall Precaution Comments: depressed skull fx - do not push on center of forehead Restrictions Weight Bearing Restrictions: Yes RUE Weight Bearing: Non weight bearing RLE Weight Bearing: Weight bearing as tolerated Vital Signs Therapy Vitals Temp: 98.5 F (36.9 C) Temp Source: Oral Pulse Rate: 98 Resp: 20 BP: 92/63 Patient Position (if appropriate): Lying Oxygen Therapy SpO2: 98 % O2 Device: Not Delivered Pain Pain Assessment Pain Assessment: No/denies pain Vision Patient Visual Report: Blurring of vision Cognition Overall Cognitive Status: Impaired/Different from baseline Arousal/Alertness: Awake/alert Orientation Level: Oriented X4 Attention: Sustained Focused Attention: Appears intact Sustained Attention: Impaired Memory: Impaired Memory Impairment: Decreased short term memory;Decreased recall of new information Awareness: Impaired Awareness Impairment: Anticipatory impairment;Emergent impairment Problem Solving: Impaired Problem Solving Impairment: Verbal basic;Functional basic Safety/Judgment: Impaired [employer] Scales of Cognitive Functioning: Confused/appropriate Sensation Coordination Gross Motor Movements are Fluid and Coordinated: Yes Fine Motor Movements are Fluid and Coordinated: Yes Mobility  Bed Mobility Bed Mobility: Rolling Right;Rolling Left;Supine to Sit;Sit to Supine Rolling Right: 5: Supervision Rolling Left: 5: Supervision Supine to Sit: 5: Supervision Sit to Supine: 5: Supervision Transfers Sit to Stand: 5: Supervision   Balance Balance Balance Assessed: Yes Standardized Balance Assessment Standardized Balance Assessment: Berg Balance Test Berg Balance Test Sit to Stand: Able to stand without using hands and stabilize independently Standing Unsupported: Able to stand 2 minutes with supervision Sitting with Back Unsupported but Feet Supported on Floor or Stool: Able  to sit safely and securely 2 minutes Stand to Sit: Sits safely with minimal use of hands Transfers: Able to transfer safely,  definite need of hands Standing Unsupported with Eyes Closed: Able to stand 10 seconds with supervision Standing Ubsupported with Feet Together: Able to place feet together independently and stand for 1 minute with supervision From Standing, Reach Forward with Outstretched Arm: Can reach confidently >25 cm (10") From Standing Position, Pick up Object from Floor: Able to pick up shoe, needs supervision From Standing Position, Turn to Look Behind Over each Shoulder: Looks behind from both sides and weight shifts well Turn 360 Degrees: Needs close supervision or verbal cueing(able to complete turns to both sides with supervision) Standing Unsupported, Alternately Place Feet on Step/Stool: Able to complete 4 steps without aid or supervision(completes 8 steps but with overall supervision) Standing Unsupported, One Foot in Front: Able to plae foot ahead of the other independently and hold 30 seconds(can maintain position for 60 seconds) Standing on One Leg: Able to lift leg independently and hold equal to or more than 3 seconds Total Score: 43 Dynamic Sitting Balance Dynamic Sitting - Level of Assistance: 5: Stand by assistance Dynamic Standing Balance Dynamic Standing - Balance Support: During functional activity Dynamic Standing - Level of Assistance: 5: Stand by assistance Extremity/Trunk Assessment RUE Assessment RUE Assessment: Exceptions to WFL(NWB, pt unable to perform supination/pronation secondary to pain and has developed heterotropic ossifications) LUE Assessment LUE Assessment: Within Functional Limits   See Function Navigator for Current Functional Status.  Gypsy Decant 07/05/2017, 5:17 PM

## 2017-07-05 NOTE — Progress Notes (Signed)
Physical Therapy Discharge Summary  Patient Details  Name: Patrick [name] MRN: [id number] Date of Birth: January 17, 1966  Today's Date: 07/05/2017 PT Individual Time: 1601-0932 and 3557-3220 PT Individual Time Calculation (min): 60 min and 61 min    Patient has met 10 of 10 long term goals due to improved activity tolerance, improved balance, improved postural control, increased strength, ability to compensate for deficits, improved attention, improved awareness and improved coordination.  Patient to discharge at an ambulatory level Supervision without AD.   Patient's care partner is independent to provide the necessary physical and cognitive assistance at discharge.  Reasons goals not met: n/a  Recommendation:  Patient will benefit from ongoing skilled PT services in home health setting to continue to advance safe functional mobility, address ongoing impairments in decreased balance, decreased strength & endurance, impaired ROM R forearm, impaired cognition, and minimize fall risk.  Equipment: No equipment provided  Reasons for discharge: treatment goals met and discharge from hospital  Patient/family agrees with progress made and goals achieved: Yes  Skilled PT Treatment: Treatment 1: Pt received asleep in bed but awakened & agreeable to tx. Pt required significant encouragement and cuing for attention to task to transfer [place]. Provided pt with paper scrub pants & pt donned them with supervision. Pt ambulated throughout unit without AD & supervision without any LOB on this date. Pt negotiated stairs without rails, uneven surface, curb, and ramp with supervision. Pt completed car transfer & bed mobility in apartment with supervision; pt with limited ability to roll to R side 2/2 RUE pain. Pt requires supervision and cuing for NWB RUE to complete floor>couch transfer. Pt asking appropriate questions regarding HHPT f/u and f/u medical appointments during session. At end of session pt  returned to room & left in bed with alarm set & all needs within reach. Therapist completed memory journal entry for pt to reference later in the day.   During session pt attempting to pick at scabs on arm with plastic knife - educated pt to not do this & made RN aware.   Treatment 2: Pt received in room & agreeable to tx. Pt ambulated throughout unit without AD & supervision. Pt completed Berg Balance Test & scored [phone]; educated pt on improvement in score, current fall risk, and need for supervision at home. Patient demonstrates increased fall risk as noted by score of 43/56 on Berg Balance Scale.  (<36= high risk for falls, close to 100%; 37-45 significant >80%; 46-51 moderate >50%; 52-55 lower >25%). Pt engaged in game of headbands and was able to correctly engage in game and describe cards without cuing. Pt utilized nu-step on level 3 x 10 minutes with BLE only with task focusing on BLE strengthening and attention to task (pt required min questioning cues to calculate remaining time). At end of session pt left sitting in recliner with QRB & chair alarm donned & call bell within reach.   PT Discharge Precautions/Restrictions Precautions Precautions: Fall Precaution Comments: depressed skull fx - do not push on center of forehead Restrictions Weight Bearing Restrictions: Yes RUE Weight Bearing: Non weight bearing(pt has sling to help maintain NWB) RLE Weight Bearing: Weight bearing as tolerated  Vision/Perception  Pt with c/o blurry vision L eye following injury.  Cognition Overall Cognitive Status: Impaired/Different from baseline Arousal/Alertness: Awake/alert Orientation Level: Oriented X4(with min questioning cues) Memory: Impaired Memory Impairment: Decreased short term memory;Decreased recall of new information Awareness: Impaired Awareness Impairment: Anticipatory impairment;Emergent impairment Safety/Judgment: Impaired Rancho [employer] Scales of Cognitive Functioning:  Confused/appropriate   Motor  Motor Motor - Discharge Observations: general weakness   Mobility Bed Mobility Bed Mobility: Rolling Right;Rolling Left;Supine to Sit;Sit to Supine Rolling Right: 5: Supervision(unable to roll completely on R side 2/2 RUE pain) Rolling Left: 5: Supervision Supine to Sit: 5: Supervision Sit to Supine: 5: Supervision Transfers Transfers: Yes Sit to Stand: 5: Supervision  Locomotion  Ambulation Ambulation: Yes Ambulation/Gait Assistance: 5: Supervision Ambulation Distance (Feet): >150 Feet Assistive device: None Stairs / Additional Locomotion Stairs: Yes Stairs Assistance: 5: Supervision Stair Management Technique: No rails Number of Stairs: 24 Height of Stairs: (6" + 3") Ramp: 5: Supervision(ambulatory without AD) Curb: 5: Supervision(ambulatory without AD) Wheelchair Mobility Wheelchair Mobility: No   Balance Balance Balance Assessed: Yes Standardized Balance Assessment Standardized Balance Assessment: Berg Balance Test Berg Balance Test Sit to Stand: Able to stand without using hands and stabilize independently Standing Unsupported: Able to stand 2 minutes with supervision Sitting with Back Unsupported but Feet Supported on Floor or Stool: Able to sit safely and securely 2 minutes Stand to Sit: Sits safely with minimal use of hands Transfers: Able to transfer safely, definite need of hands Standing Unsupported with Eyes Closed: Able to stand 10 seconds with supervision Standing Ubsupported with Feet Together: Able to place feet together independently and stand for 1 minute with supervision From Standing, Reach Forward with Outstretched Arm: Can reach confidently >25 cm (10") From Standing Position, Pick up Object from Floor: Able to pick up shoe, needs supervision From Standing Position, Turn to Look Behind Over each Shoulder: Looks behind from both sides and weight shifts well Turn 360 Degrees: Needs close supervision or verbal  cueing(able to complete turns to both sides with supervision) Standing Unsupported, Alternately Place Feet on Step/Stool: Able to complete 4 steps without aid or supervision(completes 8 steps but with overall supervision) Standing Unsupported, One Foot in Front: Able to plae foot ahead of the other independently and hold 30 seconds(can maintain position for 60 seconds) Standing on One Leg: Able to lift leg independently and hold equal to or more than 3 seconds Total Score: 43  Extremity Assessment  RUE Assessment RUE Assessment: (forearm in neutral; unable to functionally pronate or supinate 2/2 pain, pt also 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  [name] 07/05/2017, 4:39 PM

## 2017-07-05 NOTE — Progress Notes (Signed)
[facility] PHYSICAL MEDICINE & REHABILITATION     PROGRESS NOTE    Subjective/Complaints: No new complaints. Sitting in bed comfortably  ROS: pt denies nausea, vomiting, diarrhea, cough, shortness of breath or chest pain      Objective: Vital Signs: Blood pressure 122/71, pulse 73, temperature 98.9 F (37.2 C), temperature source Oral, resp. rate 16, height 6' (1.829 m), weight 75.3 kg (166 lb), SpO2 99 %. No results found. Recent Labs    07/03/17 0612  WBC 9.2  HGB 12.3*  HCT 38.5*  PLT 374   Recent Labs    07/03/17 0612  NA 139  K 4.2  CL 101  GLUCOSE 94  BUN 17  CREATININE 0.94  CALCIUM 8.9   CBG (last 3)  No results for input(s): GLUCAP in the last 72 hours.  Wt Readings from Last 3 Encounters:  07/03/17 75.3 kg (166 lb)  06/02/17 69.8 kg (153 lb 14.1 oz)    Physical Exam:  Constitutional: NAD. Vital signs reviewed. HENT: Depressed area forehead Eyes: Eyes open.  No areas of drainage or irritation Cardiovascular:RRR without murmur. No JVD          Respiratory: CTA Bilaterally without wheezes or rales. Normal effort      GI: Bowel sounds are normal. He exhibits no distension.  Musculoskeletal: He exhibits no edema or tenderness in all extremities.  Limited movement right upper extremity Right forearm incison well healed mild tenderness to palpation Neurological: Improved attention and awareness Skin: Warm and dry. Intact. Psychiatric: More redirectable, able to carry conversation for short periods of time.  Assessment/Plan: 1.  Visual and cognitive deficits as well as balance problems secondary to polytrauma and TBI which require 3+ hours per day of interdisciplinary therapy in a comprehensive inpatient rehab setting. Physiatrist is providing close team supervision and 24 hour management of active medical problems listed below. Physiatrist and rehab team continue to assess barriers to discharge/monitor patient progress toward functional and medical  goals.  Function:  Bathing Bathing position   Position: Shower  Bathing parts Body parts bathed by patient: Chest, Abdomen, Front perineal area, Buttocks, Right arm, Left arm, Left lower leg, Right upper leg, Left upper leg, Right lower leg Body parts bathed by helper: Back  Bathing assist Assist Level: Supervision or verbal cues      Upper Body Dressing/Undressing Upper body dressing   What is the patient wearing?: Pull over shirt/dress     Pull over shirt/dress - Perfomed by patient: Thread/unthread right sleeve, Thread/unthread left sleeve, Put head through opening, Pull shirt over trunk Pull over shirt/dress - Perfomed by helper: Thread/unthread right sleeve, Put head through opening        Upper body assist Assist Level: Supervision or verbal cues, Set up   Set up : To obtain clothing/put away  Lower Body Dressing/Undressing Lower body dressing   What is the patient wearing?: Pants, Non-skid slipper socks, Underwear Underwear - Performed by patient: Thread/unthread right underwear leg, Thread/unthread left underwear leg, Pull underwear up/down   Pants- Performed by patient: Thread/unthread right pants leg, Thread/unthread left pants leg, Pull pants up/down Pants- Performed by helper: Thread/unthread right pants leg, Thread/unthread left pants leg, Pull pants up/down Non-skid slipper socks- Performed by patient: Don/doff left sock, Don/doff right sock Non-skid slipper socks- Performed by helper: Don/doff right sock, Don/doff left sock             TED Hose - Performed by patient: Don/doff right TED hose, Don/doff left TED hose TED Hose -  Performed by helper: Don/doff right TED hose, Don/doff left TED hose  Lower body assist Assist for lower body dressing: Supervision or verbal cues   Set up : To obtain clothing/put away  Toileting Toileting Toileting activity did not occur: No continent bowel/bladder event Toileting steps completed by patient: Adjust clothing prior  to toileting, Performs perineal hygiene, Adjust clothing after toileting Toileting steps completed by helper: Adjust clothing prior to toileting, Performs perineal hygiene, Adjust clothing after toileting Toileting Assistive Devices: Grab bar or rail  Toileting assist Assist level: Supervision or verbal cues   Transfers Chair/bed transfer Chair/bed transfer activity did not occur: Safety/medical concerns Chair/bed transfer method: Ambulatory Chair/bed transfer assist level: Supervision or verbal cues Chair/bed transfer assistive device: Other(HHA)     Locomotion Ambulation     Max distance: 150 ft Assist level: No help, No cues, assistive device, takes more than a reasonable amount of time   Wheelchair Wheelchair activity did not occur: Safety/medical concerns        Cognition Comprehension Comprehension assist level: Understands basic 90% of the time/cues < 10% of the time  Expression Expression assist level: Expresses basic 75 - 89% of the time/requires cueing 10 - 24% of the time. Needs helper to occlude trach/needs to repeat words.  Social Interaction Social Interaction assist level: Interacts appropriately 75 - 89% of the time - Needs redirection for appropriate language or to initiate interaction.  Problem Solving Problem solving assist level: Solves basic 50 - 74% of the time/requires cueing 25 - 49% of the time  Memory Memory assist level: Recognizes or recalls 50 - 74% of the time/requires cueing 25 - 49% of the time   Medical Problem List and Plan: 1.  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  DVT Prophylaxis/Anticoagulation: Pharmaceutical: Lovenox   Vascular study negative for DVT 3. Pain Management: Hold oxycodone to improve daytime arousal.   Use tramadol for breakthrough pain or tylenol prn depending of pain intensity. 4. Mood: Team to provide ego support to continue help manage anxiety. LCSW to follow for evaluation and support when appropriate.    -Music therapy helpful 5. Neuropsych: This patient is not capable of making decisions on his ownbehalf.   -Continue Ritalin  15 mg for attention and focus  6. Skin/Wound Care: Pressure relief measures 7. Fluids/Electrolytes/Nutrition: I/Os- enc po. Eating well!   -Labs reviewed today and all within normal limits 8. Mild hyponatremia:    Sodium 139 on 06/19/2017. 9.  Leucocytosis: Resolved   WBCs decreased to 5.5 on 06/19/2017 10. Agitation/emotional dyscontrol: improving   - Continue scheduled valproic acid, increased to 500 mg 3 times daily    -  valproic acid level 82 most recently    -lft's normal, vpa level pending . -risperdal now 0.5mg  tid     -sleep improving    -  Now in low bed 11. H/o polysubstance abuse: Educate on cessation when appropriate.  12.  Right radioulnar fracture, still has some soreness ~ 6  wks post op but no concerns at this time--encouraged use of RUE as tolerated.    LOS (Days) 29 A FACE TO FACE EVALUATION WAS PERFORMED  Ranelle OysterSWARTZ,ZACHARY T, MD 07/05/2017 8:52 AM

## 2017-07-05 NOTE — Progress Notes (Signed)
Speech Language Pathology Session Note & Discharge Summary  Patient Details  Name: Patrick [name] MRN: [id number] Date of Birth: 1966/03/09  Today's Date: 07/05/2017 SLP Individual Time: 6244-6950 SLP Individual Time Calculation (min): 45 min   Skilled Therapeutic Interventions:   Skilled treatment session focused on cognitive goals. SLP facilitated session by providing Min A verbal cues for problem solving/reasoning in regards to generating a list of activities that are currently safe/unsafe at home. Patient also performed a mildly complex medication management task with Min A verbal cues in regards to organizing a 4 time per day pill box. Patient left supine in bed with all needs within reach and bed alarm on. Continue with current plan of care.   Patient has met 4 of 4 long term goals.  Patient to discharge at overall Min;Mod level.   Reasons goals not met: N/A   Clinical Impression/Discharge Summary: Patient has made excellent gains and has met 4 of 4 LTG's this admission. Currently, patient demonstrates behaviors consistent with a Rancho Level VI and requires overall Min-Mod A verbal cues to complete functional and familiar tasks safely in regards to problem solving, recall, attention and awareness. Patient and family education is complete and patient will discharge home with 24 hour supervision from family. Patient would benefit from f/u SLP services to maximize his cognitive function and overall functional independence in order to reduce caregiver burden.   Care Partner:  Caregiver Able to Provide Assistance: Yes  Type of Caregiver Assistance: Cognitive;Physical  Recommendation:  Home Health SLP;24 hour supervision/assistance  Rationale for SLP Follow Up: Maximize cognitive function and independence;Reduce caregiver burden   Equipment: N/A   Reasons for discharge: Treatment goals met;Discharged from hospital   Patient/Family Agrees with Progress Made and Goals Achieved:  Yes   Function:    Cognition Comprehension Comprehension assist level: Understands basic 90% of the time/cues < 10% of the time  Expression   Expression assist level: Expresses basic 75 - 89% of the time/requires cueing 10 - 24% of the time. Needs helper to occlude trach/needs to repeat words.  Social Interaction Social Interaction assist level: Interacts appropriately 75 - 89% of the time - Needs redirection for appropriate language or to initiate interaction.  Problem Solving Problem solving assist level: Solves basic 75 - 89% of the time/requires cueing 10 - 24% of the time  Memory Memory assist level: Recognizes or recalls 75 - 89% of the time/requires cueing 10 - 24% of the time   [place]on [place], [place], CCC-SLP [phone]   [place] 07/05/2017, 9:35 AM

## 2017-07-06 ENCOUNTER — Telehealth (HOSPITAL_COMMUNITY): Payer: Self-pay | Admitting: Physical Medicine and Rehabilitation

## 2017-07-06 MED ORDER — NITROFURANTOIN MONOHYD MACRO 100 MG PO CAPS: 100.0000 mg | ORAL_CAPSULE | Freq: Two times a day (BID) | ORAL | 0 refills | Status: DC

## 2017-07-06 MED ORDER — POLYETHYLENE GLYCOL 3350 17 G PO PACK: 17.0000 g | PACK | Freq: Every day | ORAL | 0 refills | Status: AC

## 2017-07-06 MED ORDER — RISPERIDONE 0.5 MG PO TABS: 0.5000 mg | ORAL_TABLET | Freq: Three times a day (TID) | ORAL | 0 refills | Status: DC

## 2017-07-06 MED ORDER — NICOTINE 14 MG/24HR TD PT24: 14.0000 mg | MEDICATED_PATCH | Freq: Every day | TRANSDERMAL | 0 refills | Status: DC

## 2017-07-06 MED ORDER — METHOCARBAMOL 500 MG PO TABS: 500.0000 mg | ORAL_TABLET | Freq: Two times a day (BID) | ORAL | 0 refills | Status: AC | PRN

## 2017-07-06 MED ORDER — PANTOPRAZOLE SODIUM 40 MG PO TBEC: 40.0000 mg | DELAYED_RELEASE_TABLET | Freq: Every day | ORAL | 0 refills | Status: AC

## 2017-07-06 MED ORDER — METHYLPHENIDATE HCL 10 MG PO TABS: 10.0000 mg | ORAL_TABLET | Freq: Two times a day (BID) | ORAL | 0 refills | Status: DC

## 2017-07-06 MED ORDER — DIVALPROEX SODIUM 250 MG PO DR TAB: 500.0000 mg | DELAYED_RELEASE_TABLET | Freq: Three times a day (TID) | ORAL | 2 refills | Status: AC

## 2017-07-06 NOTE — Progress Notes (Signed)
Social Work  Discharge Note  The overall goal for the admission was met for:   Discharge location: Yes - home with daughter, Nira Conn, as primary caregiver and other family to assist  Length of Stay: Yes - 30 days  Discharge activity level: Yes - supervision  Home/community participation: Yes  Services provided included: MD, RD, PT, OT, SLP, RN, TR, Pharmacy, Neuropsych and SW  Financial Services:  Medicaid and SSD applications still pending at time of d/c  Follow-up services arranged: Home Health: PT, OT, ST via [facility] and Patient/Family has no preference for HH/DME agencies  Comments (or additional information):  Enrolled pt in [place] (one month) program for medication assistance;  Provided information on how to access local [facility] in [place] for primary care;  Provided with handout for local Brain Injury support Group  Patient/Family verbalized understanding of follow-up arrangements: Yes  Individual responsible for coordination of the follow-up plan: daughter, Nira Conn  Confirmed correct DME delivered: NA  Laurisa Sahakian
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
## 2017-07-06 NOTE — Telephone Encounter (Signed)
Received call from Pharmacy that Ritalin 10 mg not covered by [facility] but  5mg  is--they only had 100 pills. Asked them to change Rx to Ritalin  5mg  #100--2 pills at breakfast and at lunch. Can be seen in office and weaned of as able.

## 2017-07-06 NOTE — Progress Notes (Signed)
Patient discharged to home, accompanied by his mother. 

## 2017-07-06 NOTE — Discharge Instructions (Signed)
Inpatient Rehab Discharge Instructions  Van ClinesDoyle Dean [name] Discharge date and time: 07/06/17   Activities/Precautions/ Functional Status: Activity: no lifting, driving, or strenuous exercise till cleared by MD Diet: regular diet Wound Care: none needed    Functional status:  ___ No restrictions     ___ Walk up steps independently _X__ 24/7 supervision/assistance   ___ Walk up steps with assistance ___ Intermittent supervision/assistance  ___ Bathe/dress independently ___ Walk with walker     _X__ Bathe/dress with assistance ___ Walk Independently    ___ Shower independently ___ Walk with assistance    ___ Shower with assistance ___ No alcohol     ___ Return to work/school ________  Special Instructions:  FOR SAFETY, 24 HOUR SUPERVISION IS RECOMMENDED UNTIL OTHERWISE DIRECTED BY A PHYSICIAN.  ABSOLUTELY NO DRIVING UNTIL OTHERWISE DIRECTED BY A PHYSICIAN. CAR KEYS SHOULD BE HIDDEN IN A SAFE LOCATION IF NEEDED.  DUE TO RISK OF INJURY, PLEASE REMOVE GUNS, KNIVES, OR ANY OTHER POTENTIALLY DANGEROUS OBJECTS AND HAZARDOUS MATERIALS FROM THE HOME.   COMMUNITY REFERRALS UPON DISCHARGE:    Home Health:   PT     OT     ST                        Agency:  [facility] Phone: [phone]  Primary Medical Care:  You can access [facility] [place] for a primary MD.  Complete paperwork and take to clinic and they will schedule new patient appointment.   GENERAL COMMUNITY RESOURCES FOR PATIENT/FAMILY:  Support Groups:  [place] [facility] Support [facility] (see flyer)    My questions have been answered and I understand these instructions. I will adhere to these goals and the provided educational materials after my discharge from the hospital.  Patient/Caregiver Signature _______________________________ Date __________  Clinician Signature _______________________________________ Date __________  Please bring this form and your medication list with you to all your  follow-up doctor's appointments.

## 2017-07-09 ENCOUNTER — Telehealth: Payer: Self-pay | Admitting: Physical Medicine & Rehabilitation

## 2017-07-09 ENCOUNTER — Telehealth: Payer: Self-pay | Admitting: Registered Nurse
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
## 2017-07-09 NOTE — Telephone Encounter (Signed)
Daughter called and states [facility] visit yesterday patients BP was 90/60 patient never used this prior to - can you change or discontinue?? Also she states he is having difficulty sleeping can you RX Sleep med.  Her number is [phone] Deneise LeverHeather Wood

## 2017-07-09 NOTE — Telephone Encounter (Signed)
Transitional Care call Transitional Care Call Completed, Appointment Confirmed, Address Confirmed, New Patient Packet Mailed.  Transitional Care Call Questions Answered by Wife and Daughter Patrick [name]  Patient name: Patrick [name] DOB: 12/17/1965 1. Are you/is patient experiencing any problems since coming home? Yes.  Patrick [name] reports Patrick [name] has been hypotensive he was prescribed Risperdal TID, she stopped the risperadal on 07/08/2017. His blood pressure on 07/09/2017 was running in the 100's systolic she states. Also stated the physical therapist was unable to perform physical due to the hypotension. They are requesting a hypnotic. The above was discussed with Dr. Riley KillSwartz, Dr. Riley KillSwartz stated for Mr. Carmer to take the Risperadal at HS only. Family has been instructed to call office with any questions and concerns. They verbalize understanding.  a. Are there any questions regarding any aspect of care? See Above 2. Are there any questions regarding medications administration/dosing? No a. Are meds being taken as prescribed? Yes, except Risperdal. See note above. b. "Patient should review meds with caller to confirm" Medication List Reviewed. 3. Have there been any falls? No 4. Has Home Health been to the house and/or have they contacted you? Yes, [facility] a. If not, have you tried to contact them? NA b. Can we help you contact them? No 5. Are bowels and bladder emptying properly? Yes a. Are there any unexpected incontinence issues? No b. If applicable, is patient following bowel/bladder programs? NA 6. Any fevers, problems with breathing, unexpected pain? No 7. Are there any skin problems or new areas of breakdown? No 8. Has the patient/family member arranged specialty MD follow up (ie cardiology/neurology/renal/surgical/etc.)?  Yes a. Can we help arrange? NA 9. Does the patient need any other services or support that we can help arrange? No 10. Are caregivers  following through as expected in assisting the patient? Yes, Wife and Daughter. 11. Has the patient quit smoking, drinking alcohol, or using drugs as recommended? Patrick [name] is smoking, he is not drinking alcohol or using illicit drugs per wife statement.   Appointment date/time 07/16/2017, arrival time 1:20 for 1:40 appointment. With Dr. Riley KillSwartz at [phone]

## 2017-07-10 ENCOUNTER — Telehealth: Payer: Self-pay | Admitting: *Deleted

## 2017-07-10 NOTE — Telephone Encounter (Signed)
Approval left on VM for [facility] .

## 2017-07-10 NOTE — Telephone Encounter (Signed)
By Patrick MiresLucy [name]'s discharge note [facility] so we should be able to approve the plan of care.

## 2017-07-10 NOTE — Telephone Encounter (Signed)
I left message with Deneise LeverHeather Wood VM to return call to the office so that we may answer any questions she may have.

## 2017-07-10 NOTE — Telephone Encounter (Signed)
Can I stop or change what? Words are missing from note.  There IS sleeping medicine ordered (As [facility]). I changed his sleeping medicine from seroquel to risperdal (which he was already taking twice during the day) to "simplify" things. Thus the net result was risperdal 0.5mg  TID with the third dose being at bedtime. We can increase the bedtime dose to 1mg  if needed.    ZTS

## 2017-07-10 NOTE — Telephone Encounter (Signed)
[facility] ST requesting verbal order for 1 wk 5.  I attempted to give verbal order but there was no name on the voicemail with her number left, so I left message to call the office back because we cannot leave details on un identified voicemail.

## 2017-07-11 ENCOUNTER — Telehealth: Payer: Self-pay

## 2017-07-11 NOTE — Telephone Encounter (Signed)
Left another voicemail to call us back

## 2017-07-11 NOTE — Telephone Encounter (Signed)
He has an appt 07/16/17 with Dr Riley KillSwartz and they can discuss at that time, since no return call.

## 2017-07-11 NOTE — Telephone Encounter (Signed)
[facility] verbal orders for 1xwk X 2wks, 2xwk X 4wks for PT, spoke with him on phone and approved verbal orders.

## 2017-07-11 NOTE — Telephone Encounter (Signed)
Contacted Tamsen SniderJulie Wymer, ST, [facility] and gave verbal orders per office protocol

## 2017-07-16 ENCOUNTER — Encounter: Payer: Self-pay | Admitting: Physical Medicine & Rehabilitation

## 2017-07-16 ENCOUNTER — Encounter: Payer: Self-pay | Attending: Physical Medicine & Rehabilitation | Admitting: Physical Medicine & Rehabilitation

## 2017-07-16 VITALS — BP 114/78 | HR 81

## 2017-07-16 DIAGNOSIS — X58XXXS Exposure to other specified factors, sequela: Secondary | ICD-10-CM | POA: Insufficient documentation

## 2017-07-16 DIAGNOSIS — R4586 Emotional lability: Secondary | ICD-10-CM | POA: Insufficient documentation

## 2017-07-16 DIAGNOSIS — S52201D Unspecified fracture of shaft of right ulna, subsequent encounter for closed fracture with routine healing: Secondary | ICD-10-CM | POA: Insufficient documentation

## 2017-07-16 DIAGNOSIS — Z801 Family history of malignant neoplasm of trachea, bronchus and lung: Secondary | ICD-10-CM | POA: Insufficient documentation

## 2017-07-16 DIAGNOSIS — Z808 Family history of malignant neoplasm of other organs or systems: Secondary | ICD-10-CM | POA: Insufficient documentation

## 2017-07-16 DIAGNOSIS — M79631 Pain in right forearm: Secondary | ICD-10-CM | POA: Insufficient documentation

## 2017-07-16 DIAGNOSIS — S062X9S Diffuse traumatic brain injury with loss of consciousness of unspecified duration, sequela: Secondary | ICD-10-CM | POA: Insufficient documentation

## 2017-07-16 DIAGNOSIS — R531 Weakness: Secondary | ICD-10-CM | POA: Insufficient documentation

## 2017-07-16 DIAGNOSIS — S062X9A Diffuse traumatic brain injury with loss of consciousness of unspecified duration, initial encounter: Secondary | ICD-10-CM

## 2017-07-16 DIAGNOSIS — F329 Major depressive disorder, single episode, unspecified: Secondary | ICD-10-CM | POA: Insufficient documentation

## 2017-07-16 DIAGNOSIS — Z9889 Other specified postprocedural states: Secondary | ICD-10-CM | POA: Insufficient documentation

## 2017-07-16 DIAGNOSIS — R42 Dizziness and giddiness: Secondary | ICD-10-CM | POA: Insufficient documentation

## 2017-07-16 DIAGNOSIS — G8918 Other acute postprocedural pain: Secondary | ICD-10-CM | POA: Insufficient documentation

## 2017-07-16 DIAGNOSIS — X58XXXD Exposure to other specified factors, subsequent encounter: Secondary | ICD-10-CM | POA: Insufficient documentation

## 2017-07-16 DIAGNOSIS — F5101 Primary insomnia: Secondary | ICD-10-CM | POA: Insufficient documentation

## 2017-07-16 MED ORDER — RISPERIDONE 0.25 MG PO TABS: 0.2500 mg | ORAL_TABLET | Freq: Two times a day (BID) | ORAL | 3 refills | Status: AC

## 2017-07-16 MED ORDER — TRAZODONE HCL 50 MG PO TABS: 50.0000 mg | ORAL_TABLET | Freq: Every day | ORAL | 2 refills | Status: AC

## 2017-07-16 MED ORDER — TRAMADOL HCL 50 MG PO TABS: 50.0000 mg | ORAL_TABLET | Freq: Two times a day (BID) | ORAL | 1 refills | Status: DC | PRN

## 2017-07-16 MED ORDER — METHYLPHENIDATE HCL 5 MG PO TABS: 5.0000 mg | ORAL_TABLET | Freq: Two times a day (BID) | ORAL | 0 refills | Status: AC

## 2017-07-16 NOTE — Patient Instructions (Addendum)
PLEASE FEEL FREE TO CALL OUR OFFICE WITH ANY PROBLEMS OR QUESTIONS [phone])   Risperdal:  Decreased 0.25 mg 3 times daily for 1 week then twice a day for 1 week then decrease to once daily for 1 week then stop  Ritalin: Decrease to 5 mg twice daily with breakfast and lunch  For sleep we will use trazodone 50 mg at nighttime.  You also need to work on better sleep hygiene which means no smoking, played music that is relaxing to help you rest.  Make sure your room is dark and quiet.   For pain I given you tramadol 50 mg to take every 12 hours as needed.  In the meantime I want to massaging and desensitizing that arm area which is tender.  He need to follow-up with the orthopedic surgeon as well to make sure your bone is healing properly underneath  Myrene GalasHandy, Michael, MD Follow up.   Specialty:  Orthopedic Surgery Why:  as needed  Contact information: [address] [name] [id number] [phone]   Moving forward you have to avoid alcohol and any drug use otherwise you are playing with fire as far as your brain injury is concerned and having complications later. You need to limit your smoking.

## 2017-07-16 NOTE — Progress Notes (Signed)
Subjective:    Patient ID: Patrick [name], male    DOB: [birth year], 52 y.o.   MRN: [id number]  HPI   Mr. Deruiter is here in follow up of his TBI and rehab stay. He has been home with family. He hangs around at the house and watches tv. Family has been taking him out to the store and to eat. He states that hes getting along with family and friends. His bp is low and he notices that he's dizzy at times.  His sleep has been poor.   He has noticed some floaters in his left eye after he takes "his medications" at times, but it's not consistent.   Therapy has been coming out to the house to work with him as well.   Pain Inventory Average Pain 5 Pain Right Now 3 My pain is intermittent, sharp and aching  In the last 24 hours, has pain interfered with the following? General activity 5 Relation with others 10 Enjoyment of life 10 What TIME of day is your pain at its worst? morning and night  Sleep (in general) Poor  Pain is worse with: bending, sitting, inactivity and standing Pain improves with: none Relief from Meds: 0  Mobility walk without assistance how many minutes can you walk? 10-15 ability to climb steps?  yes do you drive?  no  Function disabled: date disabled 05/08/2017 I need assistance with the following:  dressing, bathing and meal prep  Neuro/Psych bladder control problems weakness dizziness confusion depression  Prior Studies Any changes since last visit?  no  Physicians involved in your care Any changes since last visit?  no   Family History  Problem Relation Age of Onset  . Brain cancer Sister   . Lung cancer Sister    Social History   Socioeconomic History  . Marital status: Single    Spouse name: Not on file  . Number of children: Not on file  . Years of education: Not on file  . Highest education level: Not on file  Social Needs  . Financial resource strain: Not on file  . Food insecurity - worry: Not on file  . Food insecurity  - inability: Not on file  . Transportation needs - medical: Not on file  . Transportation needs - non-medical: Not on file  Occupational History  . Not on file  Tobacco Use  . Smoking status: Never Smoker  . Smokeless tobacco: Never Used  Substance and Sexual Activity  . Alcohol use: No    Frequency: Never  . Drug use: No  . Sexual activity: Not on file  Other Topics Concern  . Not on file  Social History Narrative  . Not on file   Past Surgical History:  Procedure Laterality Date  . CLOSED REDUCTION NASAL FRACTURE N/A 05/16/2017   Procedure: CLOSED REDUCTION NASAL FRACTURE;  Surgeon: Christia Reading, MD;  Location[facility];  Service: ENT;  Laterality: N/A;  . ORIF RADIAL FRACTURE Right 05/08/2017   Procedure: OPEN REDUCTION INTERNAL FIXATION (ORIF) BOTH BONE ARM FRACTURES;  Surgeon: Roby Lofts, MD;  Location: [facility] OR;  Service: Orthopedics;  Laterality: Right;   No past medical history on file. There were no vitals taken for this visit.  Opioid Risk Score:   Fall Risk Score:  `1  Depression screen PHQ 2/9  No flowsheet data found.  Review of Systems  Constitutional: Positive for chills, diaphoresis and fever.  Respiratory: Positive for apnea, cough and shortness of breath.  Gastrointestinal: Positive for nausea.  Endocrine:       Low blood sugar   Genitourinary:       Bladder control problem  Musculoskeletal: Positive for joint swelling.  Neurological: Positive for dizziness and weakness.  Psychiatric/Behavioral: Positive for confusion and dysphoric mood.  All other systems reviewed and are negative.      Objective:   Physical Exam  Constitutional: NAD. Vital signs reviewed. HENT: Depressed area forehead Eyes: Eyes open.  No areas of drainage or irritation Cardiovascular: RRR without murmur. No JVD           Respiratory: CTA Bilaterally without wheezes or rales. Normal effort      GI: Bowel sounds are normal. He exhibits no distension.  Musculoskeletal: He  exhibits no edema or tenderness in all extremities.  Limited movement right upper extremity Right arm still with some tenderness Neurological: Improved attention and awareness but is distractable Skin: Warm and dry. Intact. Psychiatric: More redirectable.        Assessment & Plan:  Medical Problem List and Plan: 1. s/p Severe TBI    -making continued gains   -HH therapies   -had extensive discussion with patient regarding expectations and brain injury recovery.  Reviewed the importance of smoking reduction/cessation as well as avoidance of alcohol or illicit drugs. 2 Pain Management: Patient with ongoing right forearm pain.  History significant for radial ulnar fracture.  It appears to be soft tissue predominantly.  He needs to follow-up with orthopedic surgery regarding the arm however.  Some of the sensitivity may be behavioral related to his injury although need to consider complex regional pain syndrome given presentation today.    -Limited tramadol for pain 50 mg every 12 hours as needed.  Reviewed desensitization techniques with patient and family today. 3. Neuropsych: Patient is making nice behavioral and cognitive gains.    -Reduce Ritalin to 5 mg daily at breakfast and lunch -Taper Risperdal to off starting with 0.25 mg 3 times a day and then decreasing weekly till gone -Continue Depakote as prescribed. -Added trazodone 50 mg at night for sleep.  Discussed sleep hygiene at length today.   25 minutes was spent with patient and family today.  Follow-up with me or nurse practitioner in about 1 month's time.

## 2017-07-18 ENCOUNTER — Telehealth: Payer: Self-pay

## 2017-07-18 NOTE — Telephone Encounter (Signed)
Clydie BraunKaren called today needing clarification of patients recent medication changes,   Called them back to answer questions, no answer, left voicemail to call clinic back

## 2017-07-18 NOTE — Telephone Encounter (Signed)
Clydie BraunKaren had a question about his ritalin order. I have answered her question.

## 2017-07-26 ENCOUNTER — Emergency Department (HOSPITAL_COMMUNITY)
Admission: EM | Admit: 2017-07-26 | Discharge: 2017-07-26 | Disposition: A | Discharge status: Home / Self Care | Payer: Self-pay | Attending: Emergency Medicine | Admitting: Emergency Medicine

## 2017-07-26 ENCOUNTER — Emergency Department (HOSPITAL_BASED_OUTPATIENT_CLINIC_OR_DEPARTMENT_OTHER)
Admit: 2017-07-26 | Discharge: 2017-07-26 | Disposition: A | Discharge status: Home / Self Care | Payer: Self-pay | Attending: Emergency Medicine | Admitting: Emergency Medicine

## 2017-07-26 ENCOUNTER — Telehealth: Payer: Self-pay

## 2017-07-26 ENCOUNTER — Emergency Department (HOSPITAL_COMMUNITY): Payer: Self-pay

## 2017-07-26 ENCOUNTER — Encounter (HOSPITAL_COMMUNITY): Payer: Self-pay | Admitting: Emergency Medicine

## 2017-07-26 DIAGNOSIS — R609 Edema, unspecified: Secondary | ICD-10-CM

## 2017-07-26 DIAGNOSIS — J449 Chronic obstructive pulmonary disease, unspecified: Secondary | ICD-10-CM | POA: Insufficient documentation

## 2017-07-26 DIAGNOSIS — R600 Localized edema: Secondary | ICD-10-CM | POA: Insufficient documentation

## 2017-07-26 DIAGNOSIS — Z79899 Other long term (current) drug therapy: Secondary | ICD-10-CM | POA: Insufficient documentation

## 2017-07-26 HISTORY — DX: Chronic obstructive pulmonary disease, unspecified: J44.9

## 2017-07-26 LAB — BASIC METABOLIC PANEL
Anion gap: 11 (ref 5–15)
BUN: 14 mg/dL (ref 6–20)
CALCIUM: 9.1 mg/dL (ref 8.9–10.3)
CO2: 23 mmol/L (ref 22–32)
CREATININE: 0.82 mg/dL (ref 0.61–1.24)
Chloride: 109 mmol/L (ref 101–111)
GFR calc non Af Amer: >60 mL/min (ref >60)
Glucose, Bld: 103 mg/dL — ABNORMAL HIGH (ref 65–99)
Potassium: 4 mmol/L (ref 3.5–5.1)
SODIUM: 143 mmol/L (ref 135–145)

## 2017-07-26 LAB — CBC WITH DIFFERENTIAL/PLATELET
BASOS ABS: 0 10*3/uL (ref 0.0–0.1)
BASOS PCT: 1 %
EOS ABS: 0.3 10*3/uL (ref 0.0–0.7)
EOS PCT: 5 %
HCT: 40.8 % (ref 39.0–52.0)
Hemoglobin: 13.2 g/dL (ref 13.0–17.0)
Lymphocytes Relative: 47 %
Lymphs Abs: 2.9 10*3/uL (ref 0.7–4.0)
MCH: 28.6 pg (ref 26.0–34.0)
MCHC: 32.4 g/dL (ref 30.0–36.0)
MCV: 88.5 fL (ref 78.0–100.0)
MONOS PCT: 9 %
Monocytes Absolute: 0.6 10*3/uL (ref 0.1–1.0)
Neutro Abs: 2.3 10*3/uL (ref 1.7–7.7)
Neutrophils Relative %: 38 %
PLATELETS: 344 10*3/uL (ref 150–400)
RBC: 4.61 MIL/uL (ref 4.22–5.81)
RDW: 16.3 % — AB (ref 11.5–15.5)
WBC: 6.1 10*3/uL (ref 4.0–10.5)

## 2017-07-26 LAB — D-DIMER, QUANTITATIVE: D-Dimer, Quant: 0.77 ug/mL-FEU — ABNORMAL HIGH (ref 0.00–0.50)

## 2017-07-26 NOTE — Telephone Encounter (Signed)
[facility] called to inform us that patient has gone without his medication for 3 days since patient went to friends house and did not return to his home for said time. Told them that it should be ok to continue medication as prescribed and recommended by doctors last note.  She also stated that the patient has begun to develop stage 1 edema in both legs recently, after consulting with [name] it is recommended that patient be seen in the emergency room so a physician can observe the swelling of his legs.  She sated that patient will receive this information.
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
## 2017-07-26 NOTE — Progress Notes (Signed)
LE venous duplex prelim: negative for DVT. Patrick [name] Eunice, RDMS, RVT  

## 2017-07-26 NOTE — Discharge Instructions (Signed)
As discussed, your evaluation today has been largely reassuring.  But, it is important that you monitor your condition carefully, and do not hesitate to return to the ED if you develop new, or concerning changes in your condition. ? ?Otherwise, please follow-up with your physician for appropriate ongoing care. ? ?

## 2017-07-26 NOTE — ED Notes (Signed)
Pt was given sandwich and sprite, OK per MD Jeraldine LootsLockwood

## 2017-07-26 NOTE — ED Triage Notes (Signed)
[facility] lower extremity swelling x 2 day. Painful with ambulation. Denies trauma/fall, no chest pain/shortness of breath.
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
## 2017-07-30 DIAGNOSIS — F17210 Nicotine dependence, cigarettes, uncomplicated: Secondary | ICD-10-CM

## 2017-07-30 DIAGNOSIS — R2689 Other abnormalities of gait and mobility: Secondary | ICD-10-CM

## 2017-07-30 DIAGNOSIS — S066X9D Traumatic subarachnoid hemorrhage with loss of consciousness of unspecified duration, subsequent encounter: Secondary | ICD-10-CM

## 2017-07-30 DIAGNOSIS — F1210 Cannabis abuse, uncomplicated: Secondary | ICD-10-CM

## 2017-07-30 DIAGNOSIS — S59291D Other physeal fracture of lower end of radius, right arm, subsequent encounter for fracture with routine healing: Secondary | ICD-10-CM

## 2017-07-30 DIAGNOSIS — F419 Anxiety disorder, unspecified: Secondary | ICD-10-CM

## 2017-07-30 DIAGNOSIS — R4189 Other symptoms and signs involving cognitive functions and awareness: Secondary | ICD-10-CM

## 2017-07-30 DIAGNOSIS — D72829 Elevated white blood cell count, unspecified: Secondary | ICD-10-CM

## 2017-07-30 DIAGNOSIS — S52221D Displaced transverse fracture of shaft of right ulna, subsequent encounter for closed fracture with routine healing: Secondary | ICD-10-CM

## 2017-07-30 DIAGNOSIS — E871 Hypo-osmolality and hyponatremia: Secondary | ICD-10-CM

## 2017-07-30 DIAGNOSIS — S022XXD Fracture of nasal bones, subsequent encounter for fracture with routine healing: Secondary | ICD-10-CM

## 2017-08-13 ENCOUNTER — Encounter: Payer: Self-pay | Attending: Physical Medicine & Rehabilitation | Admitting: Registered Nurse

## 2017-08-13 DIAGNOSIS — S52201D Unspecified fracture of shaft of right ulna, subsequent encounter for closed fracture with routine healing: Secondary | ICD-10-CM | POA: Insufficient documentation

## 2017-08-13 DIAGNOSIS — F329 Major depressive disorder, single episode, unspecified: Secondary | ICD-10-CM | POA: Insufficient documentation

## 2017-08-13 DIAGNOSIS — R531 Weakness: Secondary | ICD-10-CM | POA: Insufficient documentation

## 2017-08-13 DIAGNOSIS — R4586 Emotional lability: Secondary | ICD-10-CM | POA: Insufficient documentation

## 2017-08-13 DIAGNOSIS — R42 Dizziness and giddiness: Secondary | ICD-10-CM | POA: Insufficient documentation

## 2017-08-13 DIAGNOSIS — Z808 Family history of malignant neoplasm of other organs or systems: Secondary | ICD-10-CM | POA: Insufficient documentation

## 2017-08-13 DIAGNOSIS — S062X9S Diffuse traumatic brain injury with loss of consciousness of unspecified duration, sequela: Secondary | ICD-10-CM | POA: Insufficient documentation

## 2017-08-13 DIAGNOSIS — Z9889 Other specified postprocedural states: Secondary | ICD-10-CM | POA: Insufficient documentation

## 2017-08-13 DIAGNOSIS — Z801 Family history of malignant neoplasm of trachea, bronchus and lung: Secondary | ICD-10-CM | POA: Insufficient documentation

## 2017-08-13 DIAGNOSIS — M79631 Pain in right forearm: Secondary | ICD-10-CM | POA: Insufficient documentation

## 2017-08-13 DIAGNOSIS — F5101 Primary insomnia: Secondary | ICD-10-CM | POA: Insufficient documentation

## 2017-08-13 DIAGNOSIS — G8918 Other acute postprocedural pain: Secondary | ICD-10-CM | POA: Insufficient documentation

## 2018-09-15 IMAGING — DX DG CHEST 1V PORT
1 series · 1 of 1 positions shown · non-contrast
Comparison: May 17, 2017

CLINICAL DATA: Hypoxia

EXAM:
PORTABLE CHEST 1 VIEW

[chest]
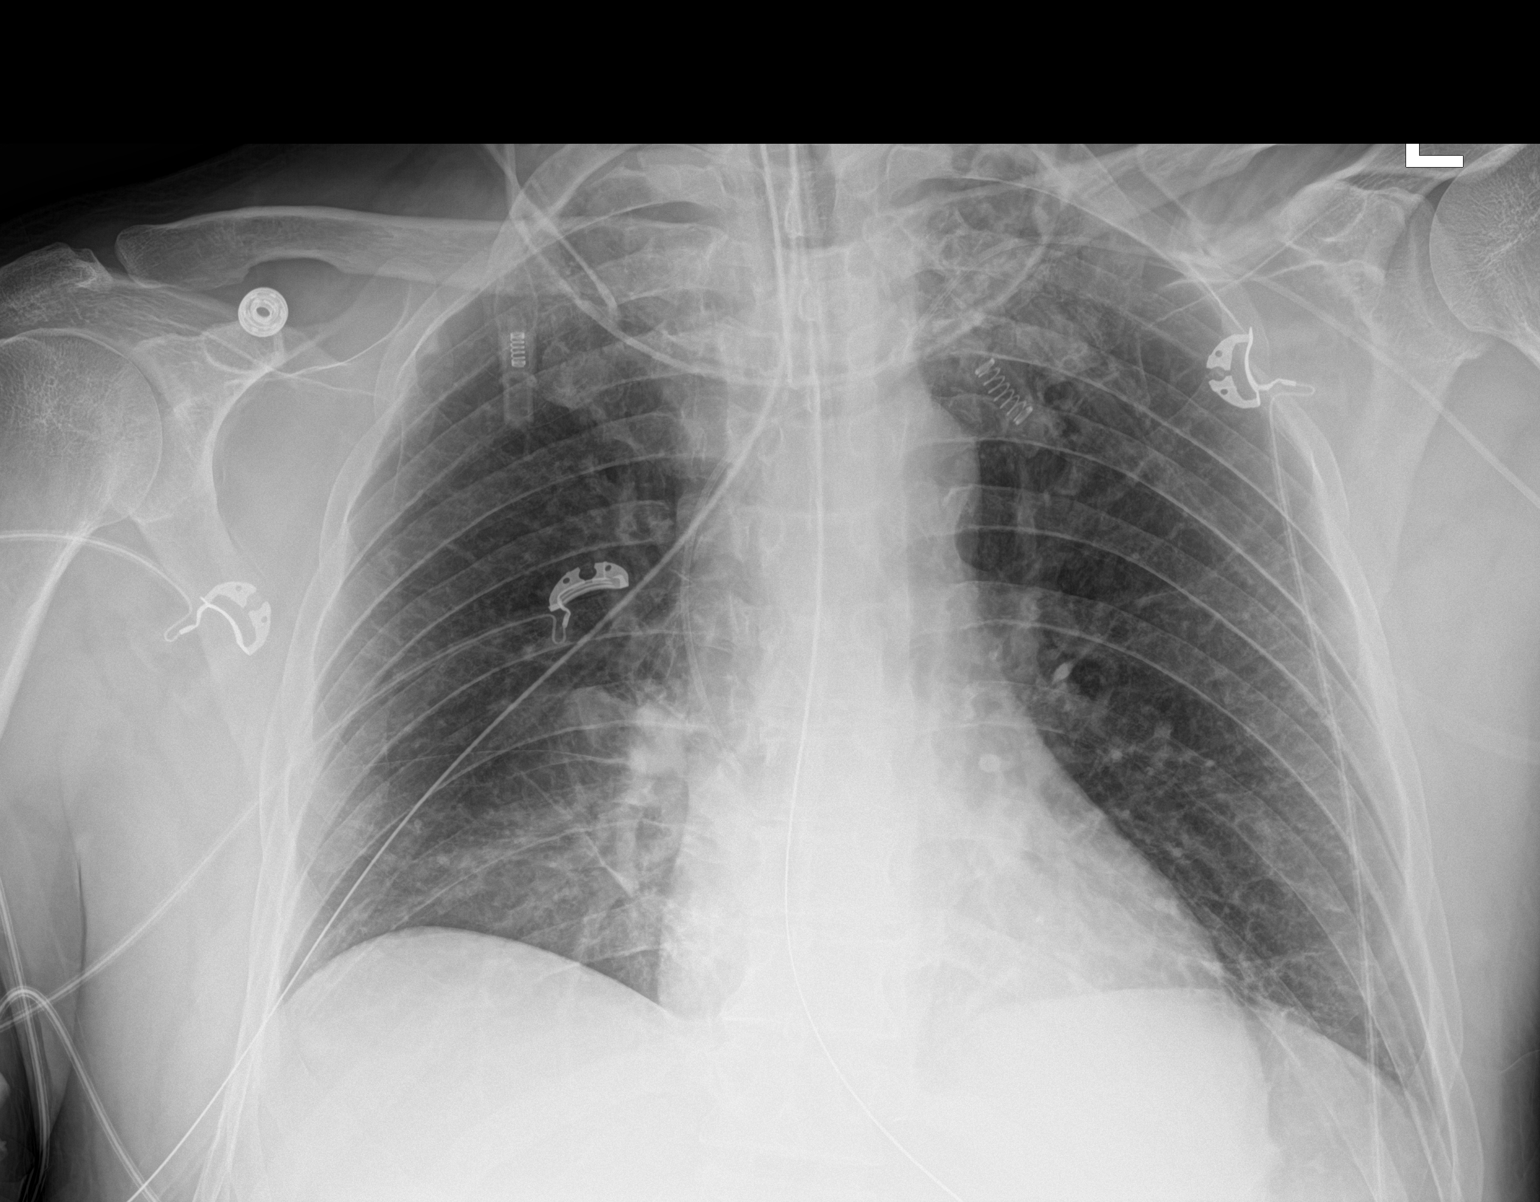

[1 of 1 positions shown; findings below may reference images not displayed]

FINDINGS: Endotracheal tube tip is 4.8 cm above the carina. Central catheter
tip is in superior vena cava. Nasogastric tube tip and side port
below the diaphragm. No pneumothorax. There is slight bibasilar
atelectasis. There is no edema or consolidation. The heart size and
pulmonary vascularity are normal. No adenopathy. No bone lesions.
IMPRESSION: Tube and catheter positions as described without pneumothorax.
Bibasilar atelectasis. No edema or consolidation. Stable cardiac
silhouette.

## 2018-09-18 IMAGING — DX DG FOREARM 2V*R*
1 series · 2 of 2 positions shown · non-contrast
Comparison: Intraoperative films from 05/08/2017

CLINICAL DATA: Status post ORIF of radial and ulnar fractures.

EXAM:
RIGHT FOREARM - 2 VIEW

[Series 1: forearmbone · 0.14mm/px · 2 of 2 slices shown]
[im 1/2]
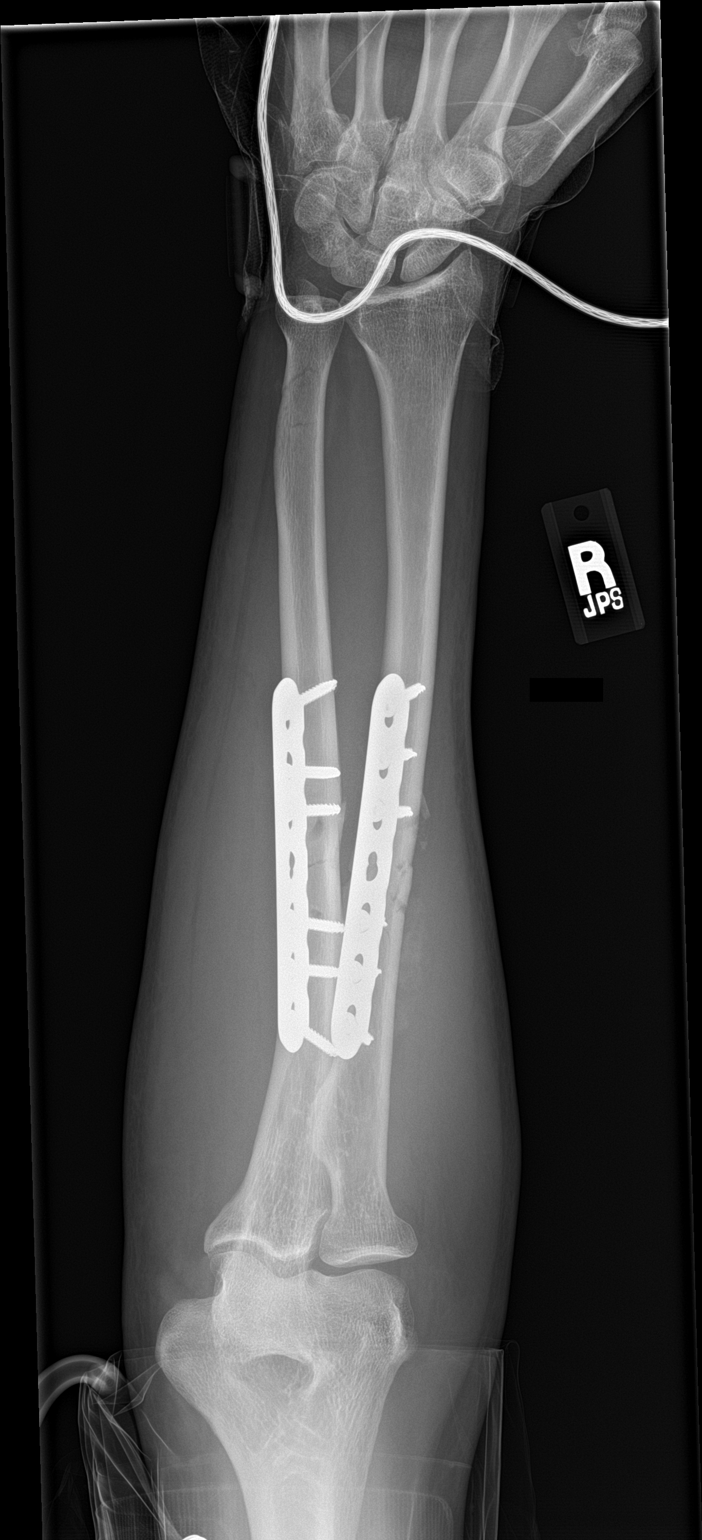
[im 2/2]
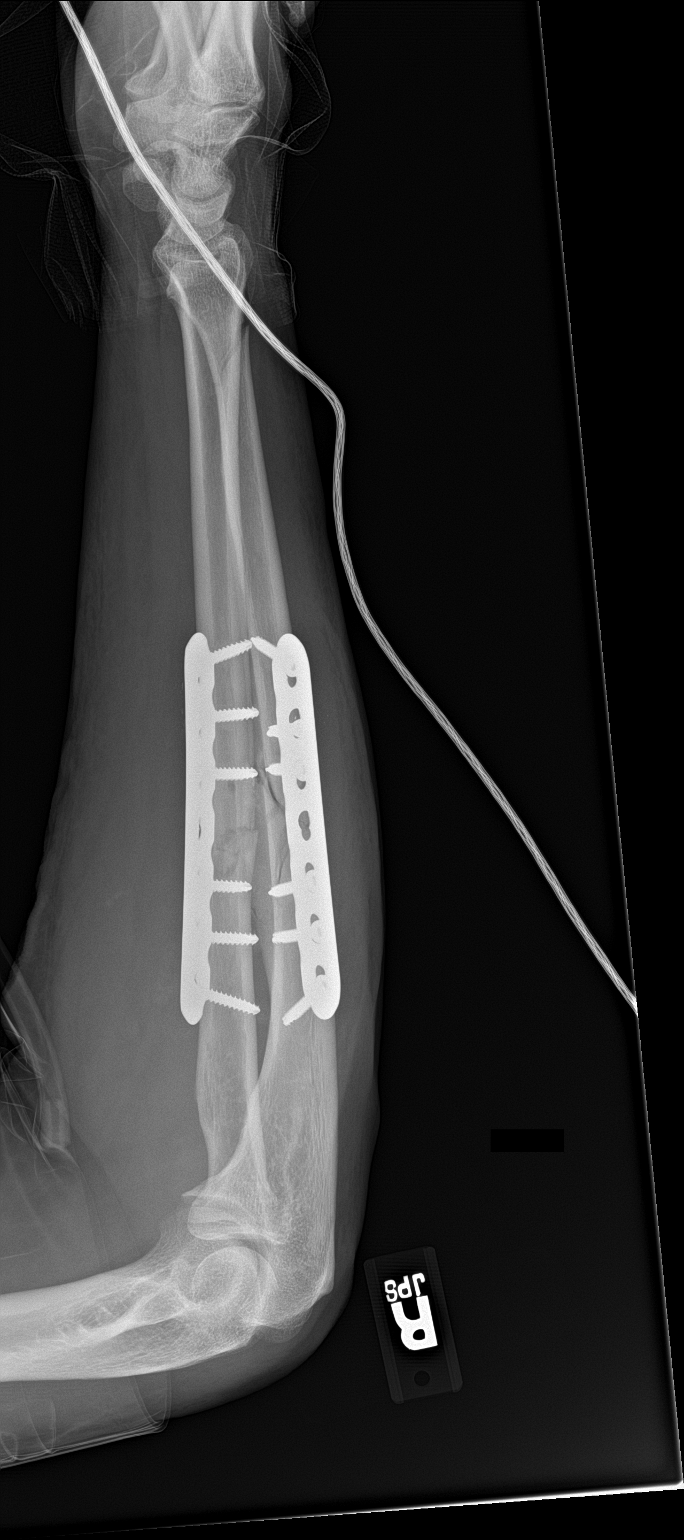

[2 of 2 positions shown; findings below may reference images not displayed]

FINDINGS: Fixation side plates are again noted along the mid radius and ulna
with near anatomic alignment of the fracture fragments. A minimally
displaced fracture in the distal ulna is again seen and stable. No
new focal abnormality is noted.
IMPRESSION: Changes of ORIF of midshaft radial and ulnar fractures.

Stable mildly displaced distal ulnar fracture.

## 2018-09-20 IMAGING — DX DG SHOULDER 1V*L*
1 series · 1 of 1 positions shown · non-contrast
Comparison: None.

CLINICAL DATA: PICC line

EXAM:
LEFT SHOULDER - 1 VIEW

[shoulder ap]
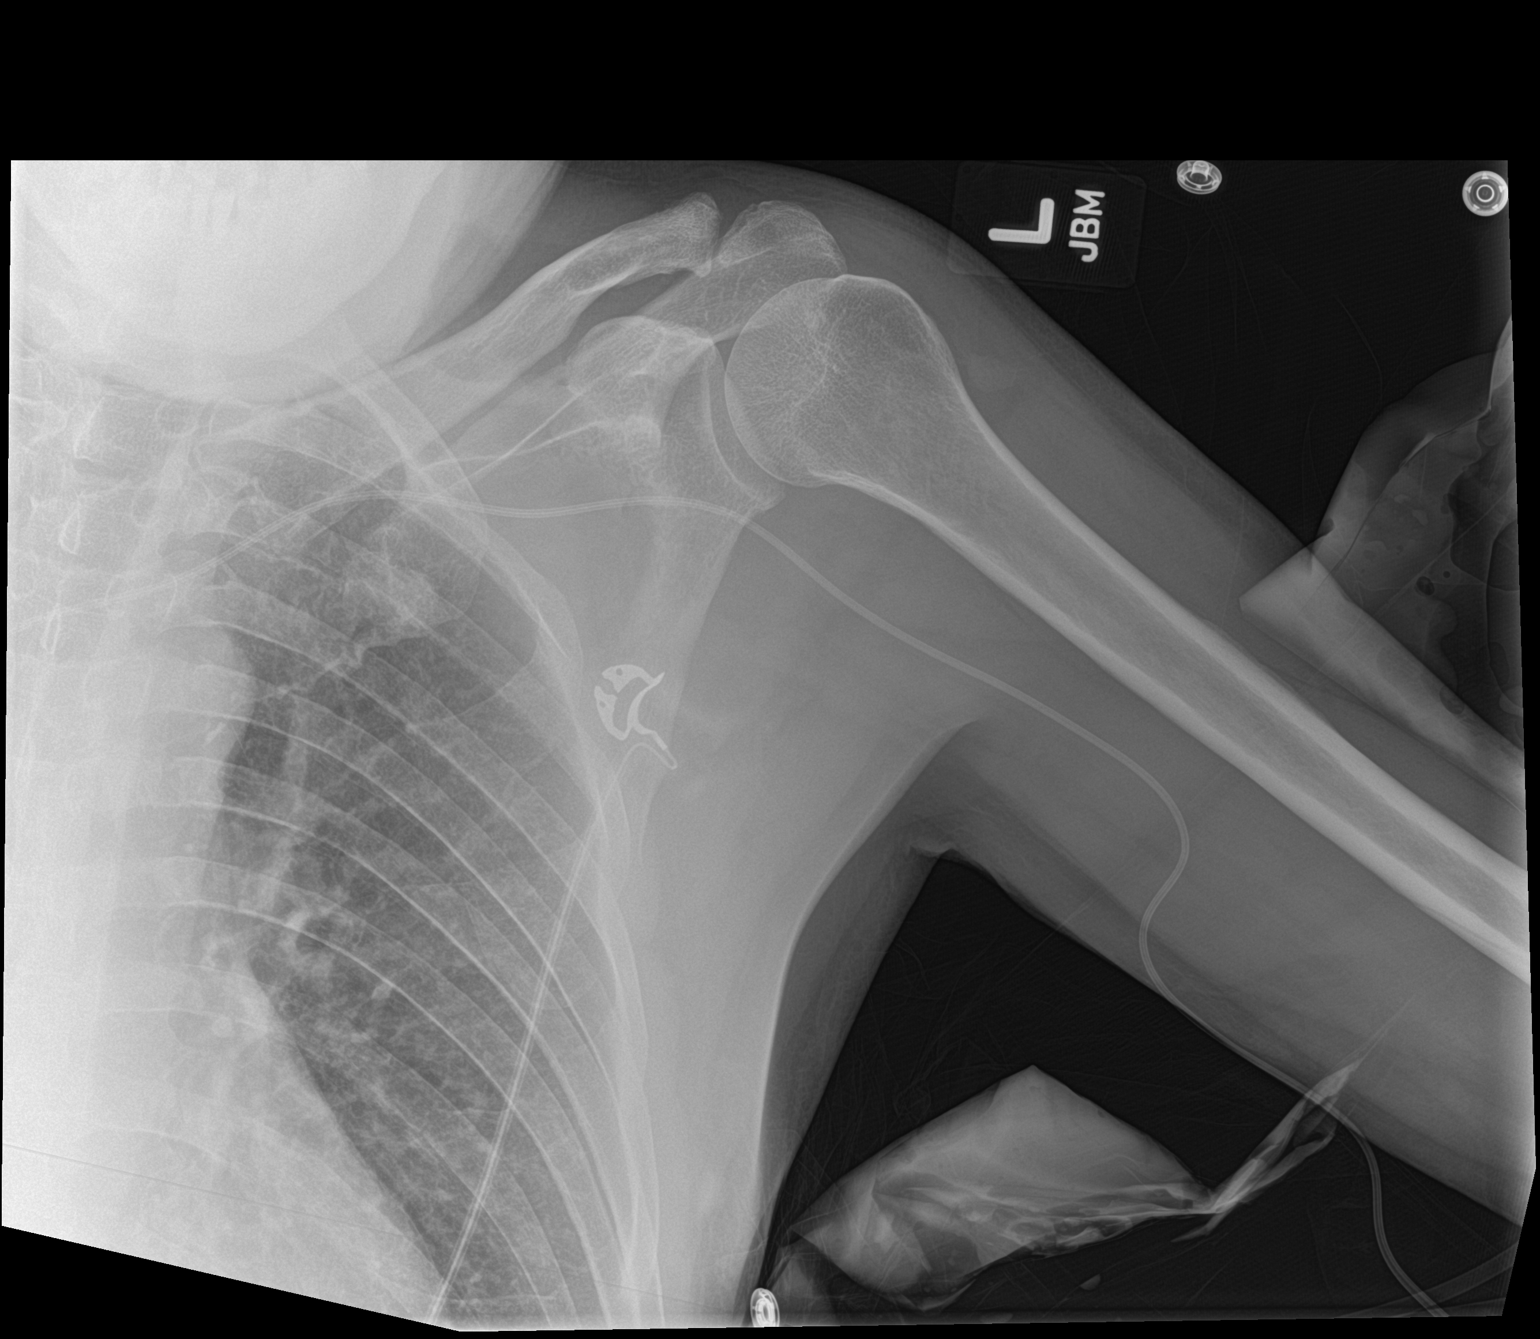

[1 of 1 positions shown; findings below may reference images not displayed]

FINDINGS: The left upper extremity PICC is in place. The tip traverses into
the mediastinum. The mediastinum is partially imaged. The visualized
left lung is clear. The right lung is not included.
IMPRESSION: See above comments.
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
## 2021-05-15 ENCOUNTER — Emergency Department (HOSPITAL_COMMUNITY): Payer: Self-pay

## 2021-05-15 ENCOUNTER — Other Ambulatory Visit: Payer: Self-pay

## 2021-05-15 ENCOUNTER — Inpatient Hospital Stay (HOSPITAL_COMMUNITY)
Admission: EM | Admit: 2021-05-15 | Discharge: 2021-05-21 | DRG: 200 | Disposition: A | Discharge status: Home / Self Care | Payer: Self-pay | Attending: Surgery | Admitting: Surgery

## 2021-05-15 ENCOUNTER — Observation Stay (HOSPITAL_COMMUNITY): Payer: Self-pay

## 2021-05-15 DIAGNOSIS — Z801 Family history of malignant neoplasm of trachea, bronchus and lung: Secondary | ICD-10-CM

## 2021-05-15 DIAGNOSIS — S2249XA Multiple fractures of ribs, unspecified side, initial encounter for closed fracture: Secondary | ICD-10-CM | POA: Diagnosis not present

## 2021-05-15 DIAGNOSIS — Y92488 Other paved roadways as the place of occurrence of the external cause: Secondary | ICD-10-CM

## 2021-05-15 DIAGNOSIS — Z9981 Dependence on supplemental oxygen: Secondary | ICD-10-CM

## 2021-05-15 DIAGNOSIS — T1490XA Injury, unspecified, initial encounter: Secondary | ICD-10-CM

## 2021-05-15 DIAGNOSIS — R52 Pain, unspecified: Secondary | ICD-10-CM

## 2021-05-15 DIAGNOSIS — J939 Pneumothorax, unspecified: Secondary | ICD-10-CM

## 2021-05-15 DIAGNOSIS — J449 Chronic obstructive pulmonary disease, unspecified: Secondary | ICD-10-CM | POA: Diagnosis present

## 2021-05-15 DIAGNOSIS — S27321A Contusion of lung, unilateral, initial encounter: Secondary | ICD-10-CM | POA: Diagnosis present

## 2021-05-15 DIAGNOSIS — S272XXA Traumatic hemopneumothorax, initial encounter: Principal | ICD-10-CM | POA: Diagnosis present

## 2021-05-15 DIAGNOSIS — Z20822 Contact with and (suspected) exposure to covid-19: Secondary | ICD-10-CM | POA: Diagnosis present

## 2021-05-15 DIAGNOSIS — F17210 Nicotine dependence, cigarettes, uncomplicated: Secondary | ICD-10-CM | POA: Diagnosis present

## 2021-05-15 DIAGNOSIS — Z808 Family history of malignant neoplasm of other organs or systems: Secondary | ICD-10-CM

## 2021-05-15 DIAGNOSIS — S2241XA Multiple fractures of ribs, right side, initial encounter for closed fracture: Secondary | ICD-10-CM | POA: Diagnosis present

## 2021-05-15 LAB — RESP PANEL BY RT-PCR (FLU A&B, COVID) ARPGX2
Influenza A by PCR: NEGATIVE
Influenza B by PCR: NEGATIVE
SARS Coronavirus 2 by RT PCR: NEGATIVE

## 2021-05-15 LAB — I-STAT CHEM 8, ED
BUN: 12 mg/dL (ref 6–20)
Calcium, Ion: 1.17 mmol/L (ref 1.15–1.40)
Chloride: 106 mmol/L (ref 98–111)
Creatinine, Ser: 0.8 mg/dL (ref 0.61–1.24)
Glucose, Bld: 128 mg/dL — ABNORMAL HIGH (ref 70–99)
HCT: 45 % (ref 39.0–52.0)
Hemoglobin: 15.3 g/dL (ref 13.0–17.0)
Potassium: 4.1 mmol/L (ref 3.5–5.1)
Sodium: 142 mmol/L (ref 135–145)
TCO2: 26 mmol/L (ref 22–32)

## 2021-05-15 LAB — CBC
HCT: 44.4 % (ref 39.0–52.0)
Hemoglobin: 14.3 g/dL (ref 13.0–17.0)
MCH: 29.3 pg (ref 26.0–34.0)
MCHC: 32.2 g/dL (ref 30.0–36.0)
MCV: 91 fL (ref 80.0–100.0)
Platelets: 313 10*3/uL (ref 150–400)
RBC: 4.88 MIL/uL (ref 4.22–5.81)
RDW: 12.8 % (ref 11.5–15.5)
WBC: 14.5 10*3/uL — ABNORMAL HIGH (ref 4.0–10.5)
nRBC: 0 % (ref 0.0–0.2)

## 2021-05-15 LAB — COMPREHENSIVE METABOLIC PANEL
ALT: 14 U/L (ref 0–44)
AST: 20 U/L (ref 15–41)
Albumin: 3.7 g/dL (ref 3.5–5.0)
Alkaline Phosphatase: 79 U/L (ref 38–126)
Anion gap: 5 (ref 5–15)
BUN: 10 mg/dL (ref 6–20)
CO2: 24 mmol/L (ref 22–32)
Calcium: 8.8 mg/dL — ABNORMAL LOW (ref 8.9–10.3)
Chloride: 106 mmol/L (ref 98–111)
Creatinine, Ser: 0.83 mg/dL (ref 0.61–1.24)
GFR, Estimated: >60 mL/min (ref >60)
Glucose, Bld: 129 mg/dL — ABNORMAL HIGH (ref 70–99)
Potassium: 3.9 mmol/L (ref 3.5–5.1)
Sodium: 135 mmol/L (ref 135–145)
Total Bilirubin: 0.7 mg/dL (ref 0.3–1.2)
Total Protein: 6.7 g/dL (ref 6.5–8.1)

## 2021-05-15 LAB — URINALYSIS, ROUTINE W REFLEX MICROSCOPIC
Bilirubin Urine: NEGATIVE
Glucose, UA: NEGATIVE mg/dL
Hgb urine dipstick: NEGATIVE
Ketones, ur: NEGATIVE mg/dL
Leukocytes,Ua: NEGATIVE
Nitrite: NEGATIVE
Protein, ur: NEGATIVE mg/dL
Specific Gravity, Urine: 1.01 (ref 1.005–1.030)
pH: 5 (ref 5.0–8.0)

## 2021-05-15 LAB — SAMPLE TO BLOOD BANK

## 2021-05-15 LAB — LACTIC ACID, PLASMA: Lactic Acid, Venous: 2 mmol/L (ref 0.5–1.9)

## 2021-05-15 LAB — PROTIME-INR
INR: 1.1 (ref 0.8–1.2)
Prothrombin Time: 14.1 seconds (ref 11.4–15.2)

## 2021-05-15 LAB — ETHANOL: Alcohol, Ethyl (B): <10 mg/dL (ref <10)

## 2021-05-15 MED ORDER — HYDROMORPHONE HCL 1 MG/ML IJ SOLN
0.5000 mg | INTRAMUSCULAR | Status: DC | PRN
  Administered 2021-05-15 – 2021-05-17 (×6): 0.5 mg via INTRAVENOUS
  Filled 2021-05-15: qty 1
  Filled 2021-05-15: qty 0.5
  Filled 2021-05-15: qty 1
  Filled 2021-05-15 (×3): qty 0.5

## 2021-05-15 MED ORDER — SODIUM CHLORIDE 0.9% FLUSH
3.0000 mL | INTRAVENOUS | Status: DC | PRN
  Administered 2021-05-15: 23:00:00 3 mL via INTRAVENOUS

## 2021-05-15 MED ORDER — FENTANYL CITRATE PF 50 MCG/ML IJ SOSY
50.0000 ug | PREFILLED_SYRINGE | Freq: Once | INTRAMUSCULAR | Status: AC
  Administered 2021-05-15: 20:00:00 50 ug via INTRAVENOUS
  Filled 2021-05-15: qty 1

## 2021-05-15 MED ORDER — ONDANSETRON HCL 4 MG/2ML IJ SOLN: 4.0000 mg | Freq: Four times a day (QID) | INTRAMUSCULAR | Status: DC | PRN

## 2021-05-15 MED ORDER — SODIUM CHLORIDE 0.9 % IV BOLUS
1000.0000 mL | Freq: Once | INTRAVENOUS | Status: AC
  Administered 2021-05-15: 20:00:00 1000 mL via INTRAVENOUS

## 2021-05-15 MED ORDER — SODIUM CHLORIDE 0.9 % IV SOLN: 250.0000 mL | INTRAVENOUS | Status: DC | PRN

## 2021-05-15 MED ORDER — TRAMADOL HCL 50 MG PO TABS
50.0000 mg | ORAL_TABLET | Freq: Four times a day (QID) | ORAL | Status: DC | PRN
  Administered 2021-05-16 – 2021-05-18 (×5): 50 mg via ORAL
  Filled 2021-05-15 (×5): qty 1

## 2021-05-15 MED ORDER — FENTANYL CITRATE PF 50 MCG/ML IJ SOSY
50.0000 ug | PREFILLED_SYRINGE | Freq: Once | INTRAMUSCULAR | Status: AC
  Administered 2021-05-15: 21:00:00 50 ug via INTRAVENOUS
  Filled 2021-05-15: qty 1

## 2021-05-15 MED ORDER — ONDANSETRON 4 MG PO TBDP: 4.0000 mg | ORAL_TABLET | Freq: Four times a day (QID) | ORAL | Status: DC | PRN

## 2021-05-15 MED ORDER — SODIUM CHLORIDE 0.9% FLUSH
3.0000 mL | Freq: Two times a day (BID) | INTRAVENOUS | Status: DC
  Administered 2021-05-16 – 2021-05-20 (×7): 3 mL via INTRAVENOUS

## 2021-05-15 MED ORDER — SODIUM CHLORIDE 0.9 % IV SOLN: INTRAVENOUS | Status: DC

## 2021-05-15 MED ORDER — METHOCARBAMOL 500 MG PO TABS
1000.0000 mg | ORAL_TABLET | Freq: Four times a day (QID) | ORAL | Status: DC | PRN
  Administered 2021-05-16: 01:00:00 1000 mg via ORAL
  Filled 2021-05-15: qty 2

## 2021-05-15 MED ORDER — IOHEXOL 300 MG/ML  SOLN
100.0000 mL | Freq: Once | INTRAMUSCULAR | Status: AC | PRN
  Administered 2021-05-15: 21:00:00 100 mL via INTRAVENOUS

## 2021-05-15 MED ORDER — ENOXAPARIN SODIUM 30 MG/0.3ML IJ SOSY
30.0000 mg | PREFILLED_SYRINGE | Freq: Two times a day (BID) | INTRAMUSCULAR | Status: DC
  Administered 2021-05-16 – 2021-05-21 (×11): 30 mg via SUBCUTANEOUS
  Filled 2021-05-15 (×11): qty 0.3

## 2021-05-15 NOTE — ED Notes (Signed)
Pt placed on nonrebreather.  
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
## 2021-05-15 NOTE — ED Notes (Signed)
Splint tech at bedside at this time

## 2021-05-15 NOTE — Progress Notes (Signed)
Orthopedic Tech Progress Note Patient Details:  Patrick [name] January 26, 1966 [id number]  Patient ID: [name], male   DOB: 02/21/1966, 55 y.o.   MRN: [id number] I attended the trauma page. Trinna Post 05/15/2021, 11:14 PM

## 2021-05-15 NOTE — ED Notes (Signed)
Called to room by Primary RN Bonita Quin, Pt noted to be very anxious, diaphoretic, c/o sudden severe pain and shob, bright red blood noted in tubing of chest tube, suction in good working order to [employer]. Bandages in place, pigtail does not appear to be dislodged. Pt recently given pain medication. Pcxr ordered TMD Derrell Lolling updated. Will continue to monitor.

## 2021-05-15 NOTE — ED Notes (Signed)
Pt diaphoretic, anxious , agitated, sitting straight up in bed. C/o severe SOB at this time and c/o pain in his chest. Pt is also asking for more water. Spoke with trauma nurse and got her to bedside. Portable chest x-ray per her request

## 2021-05-15 NOTE — ED Notes (Signed)
Trauma provider at bedside at this time 

## 2021-05-15 NOTE — ED Notes (Signed)
Radiology at bedside
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
## 2021-05-15 NOTE — ED Notes (Signed)
Pt requesting more pain meds. Spoke with provider reference same. Pt used urinal and ua sample sent to lab

## 2021-05-15 NOTE — ED Notes (Signed)
Pt placed on NRB at 15lpm
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
## 2021-05-15 NOTE — ED Notes (Signed)
O2 increased to 4lpm [place]

## 2021-05-15 NOTE — ED Notes (Signed)
Patient transported to CT 

## 2021-05-15 NOTE — ED Notes (Signed)
Per Dr. Donnald Garre c-collar could be removed. Removed from pt at this time and provided water

## 2021-05-15 NOTE — ED Notes (Signed)
Provider completing bedside fast exam with [place]

## 2021-05-15 NOTE — Procedures (Signed)
Chest tube insertion  Date/Time: 05/15/2021 9:41 PM Performed by: Axel Filler, MD Authorized by: Axel Filler, MD   Consent:    Consent obtained:  Emergent situation Pre-procedure details:    Skin preparation:  ChloraPrep Procedure details:    Placement location:  R anterior   Scalpel size:  11   Tube size (Fr):  8   Technique: Seldinger     Ultrasound guidance: no     Tension pneumothorax: yes     Tube connected to:  Suction   Drainage characteristics:  Air only   Suture material:  2-0 silk   Dressing:  4x4 sterile gauze Post-procedure details:    Post-insertion x-ray findings: tube in good position     Patient tolerance of procedure:  Tolerated well, no immediate complications

## 2021-05-16 ENCOUNTER — Observation Stay (HOSPITAL_COMMUNITY): Payer: Self-pay

## 2021-05-16 DIAGNOSIS — F17210 Nicotine dependence, cigarettes, uncomplicated: Secondary | ICD-10-CM | POA: Diagnosis present

## 2021-05-16 DIAGNOSIS — J449 Chronic obstructive pulmonary disease, unspecified: Secondary | ICD-10-CM | POA: Diagnosis present

## 2021-05-16 DIAGNOSIS — Y92488 Other paved roadways as the place of occurrence of the external cause: Secondary | ICD-10-CM | POA: Diagnosis not present

## 2021-05-16 DIAGNOSIS — S272XXA Traumatic hemopneumothorax, initial encounter: Secondary | ICD-10-CM | POA: Diagnosis present

## 2021-05-16 DIAGNOSIS — Z9981 Dependence on supplemental oxygen: Secondary | ICD-10-CM | POA: Diagnosis not present

## 2021-05-16 DIAGNOSIS — S2241XA Multiple fractures of ribs, right side, initial encounter for closed fracture: Secondary | ICD-10-CM | POA: Diagnosis present

## 2021-05-16 DIAGNOSIS — Z801 Family history of malignant neoplasm of trachea, bronchus and lung: Secondary | ICD-10-CM | POA: Diagnosis not present

## 2021-05-16 DIAGNOSIS — S27321A Contusion of lung, unilateral, initial encounter: Secondary | ICD-10-CM | POA: Diagnosis present

## 2021-05-16 DIAGNOSIS — Z20822 Contact with and (suspected) exposure to covid-19: Secondary | ICD-10-CM | POA: Diagnosis present

## 2021-05-16 DIAGNOSIS — Z808 Family history of malignant neoplasm of other organs or systems: Secondary | ICD-10-CM | POA: Diagnosis not present

## 2021-05-16 DIAGNOSIS — S2249XA Multiple fractures of ribs, unspecified side, initial encounter for closed fracture: Secondary | ICD-10-CM | POA: Diagnosis present

## 2021-05-16 LAB — BASIC METABOLIC PANEL
Anion gap: 6 (ref 5–15)
BUN: 9 mg/dL (ref 6–20)
CO2: 23 mmol/L (ref 22–32)
Calcium: 8.5 mg/dL — ABNORMAL LOW (ref 8.9–10.3)
Chloride: 107 mmol/L (ref 98–111)
Creatinine, Ser: 0.71 mg/dL (ref 0.61–1.24)
GFR, Estimated: >60 mL/min (ref >60)
Glucose, Bld: 109 mg/dL — ABNORMAL HIGH (ref 70–99)
Potassium: 3.8 mmol/L (ref 3.5–5.1)
Sodium: 136 mmol/L (ref 135–145)

## 2021-05-16 LAB — HIV ANTIBODY (ROUTINE TESTING W REFLEX): HIV Screen 4th Generation wRfx: NONREACTIVE

## 2021-05-16 LAB — CBC
HCT: 39.4 % (ref 39.0–52.0)
Hemoglobin: 13 g/dL (ref 13.0–17.0)
MCH: 29.7 pg (ref 26.0–34.0)
MCHC: 33 g/dL (ref 30.0–36.0)
MCV: 90 fL (ref 80.0–100.0)
Platelets: 264 10*3/uL (ref 150–400)
RBC: 4.38 MIL/uL (ref 4.22–5.81)
RDW: 12.6 % (ref 11.5–15.5)
WBC: 12.5 10*3/uL — ABNORMAL HIGH (ref 4.0–10.5)
nRBC: 0 % (ref 0.0–0.2)

## 2021-05-16 MED ORDER — LORAZEPAM 2 MG/ML IJ SOLN
0.5000 mg | INTRAMUSCULAR | Status: DC | PRN
  Administered 2021-05-16: 0.5 mg via INTRAVENOUS
  Filled 2021-05-16: qty 1

## 2021-05-16 MED ORDER — IBUPROFEN 800 MG PO TABS
800.0000 mg | ORAL_TABLET | Freq: Three times a day (TID) | ORAL | Status: DC
  Administered 2021-05-16 – 2021-05-21 (×16): 800 mg via ORAL
  Filled 2021-05-16 (×18): qty 1

## 2021-05-16 MED ORDER — GABAPENTIN 300 MG PO CAPS
300.0000 mg | ORAL_CAPSULE | Freq: Three times a day (TID) | ORAL | Status: DC
  Administered 2021-05-16 – 2021-05-21 (×16): 300 mg via ORAL
  Filled 2021-05-16 (×16): qty 1

## 2021-05-16 MED ORDER — OXYCODONE HCL 5 MG PO TABS: 5.0000 mg | ORAL_TABLET | Freq: Four times a day (QID) | ORAL | Status: DC | PRN

## 2021-05-16 MED ORDER — METHOCARBAMOL 500 MG PO TABS
1000.0000 mg | ORAL_TABLET | Freq: Four times a day (QID) | ORAL | Status: DC | PRN
  Administered 2021-05-18 – 2021-05-21 (×3): 1000 mg via ORAL
  Filled 2021-05-16 (×3): qty 2

## 2021-05-16 MED ORDER — ACETAMINOPHEN 500 MG PO TABS
1000.0000 mg | ORAL_TABLET | Freq: Four times a day (QID) | ORAL | Status: DC
  Administered 2021-05-16 – 2021-05-21 (×19): 1000 mg via ORAL
  Filled 2021-05-16 (×22): qty 2
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
## 2021-05-16 NOTE — ED Notes (Signed)
NRB removed, pt placed on 3L [place]. Primary RN aware

## 2021-05-16 NOTE — ED Notes (Signed)
Awake at this time. C/o severe pain in his chest, grunting with breathing, tossing and turning and moving around in the bed. Pt repositioned and provided water.
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
## 2021-05-16 NOTE — ED Notes (Signed)
Breakfast orders placed 

## 2021-05-16 NOTE — Progress Notes (Signed)
°  Transition of Care [facility]) Screening Note   Patient Details  Name: Patrick [name] Date of Birth: 1966/01/19   Transition of Care [facility]) CM/SW Contact:    Glennon Mac, RN Phone Number: 05/16/2021, 4:49 PM    Transition of Care Department [facility]) has reviewed patient and no TOC needs have been identified at this time. We will continue to monitor patient advancement through interdisciplinary progression rounds. If new patient transition needs arise, please place a TOC consult.    Quintella Baton, RN, BSN  Trauma/Neuro ICU Case Manager [phone]

## 2021-05-16 NOTE — ED Notes (Signed)
Pt provided meds as ordered and repositioned and laid on lt side per his request. Provided something to drink at this time

## 2021-05-17 ENCOUNTER — Inpatient Hospital Stay (HOSPITAL_COMMUNITY): Payer: Self-pay

## 2021-05-17 MED ORDER — DOCUSATE SODIUM 100 MG PO CAPS
100.0000 mg | ORAL_CAPSULE | Freq: Two times a day (BID) | ORAL | Status: DC
  Administered 2021-05-17 – 2021-05-21 (×8): 100 mg via ORAL
  Filled 2021-05-17 (×9): qty 1

## 2021-05-17 MED ORDER — POLYETHYLENE GLYCOL 3350 17 G PO PACK
17.0000 g | PACK | Freq: Every day | ORAL | Status: DC
  Administered 2021-05-17 – 2021-05-20 (×4): 17 g via ORAL
  Filled 2021-05-17 (×5): qty 1

## 2021-05-17 MED ORDER — HYDROMORPHONE HCL 1 MG/ML IJ SOLN
0.5000 mg | INTRAMUSCULAR | Status: DC | PRN
  Administered 2021-05-18: 08:00:00 0.5 mg via INTRAVENOUS
  Filled 2021-05-17: qty 0.5

## 2021-05-17 MED ORDER — GUAIFENESIN ER 600 MG PO TB12
600.0000 mg | ORAL_TABLET | Freq: Two times a day (BID) | ORAL | Status: DC
  Administered 2021-05-17 – 2021-05-21 (×9): 600 mg via ORAL
  Filled 2021-05-17 (×9): qty 1

## 2021-05-17 MED ORDER — IPRATROPIUM-ALBUTEROL 0.5-2.5 (3) MG/3ML IN SOLN: 3.0000 mL | Freq: Four times a day (QID) | RESPIRATORY_TRACT | Status: DC | PRN
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
## 2021-05-18 ENCOUNTER — Inpatient Hospital Stay (HOSPITAL_COMMUNITY): Payer: Self-pay

## 2021-05-18 MED ORDER — TRAMADOL HCL 50 MG PO TABS
50.0000 mg | ORAL_TABLET | Freq: Four times a day (QID) | ORAL | Status: DC
  Administered 2021-05-18 – 2021-05-21 (×13): 50 mg via ORAL
  Filled 2021-05-18 (×13): qty 1

## 2021-05-18 MED ORDER — OXYCODONE HCL 5 MG PO TABS
5.0000 mg | ORAL_TABLET | Freq: Four times a day (QID) | ORAL | Status: DC | PRN
Start: 2021-05-18 — End: 2021-05-21
  Filled 2021-05-18: qty 2
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
## 2021-05-19 ENCOUNTER — Inpatient Hospital Stay (HOSPITAL_COMMUNITY): Payer: Self-pay
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
## 2021-05-20 ENCOUNTER — Inpatient Hospital Stay (HOSPITAL_COMMUNITY): Payer: Self-pay

## 2021-05-20 ENCOUNTER — Other Ambulatory Visit (HOSPITAL_COMMUNITY): Payer: Self-pay

## 2021-05-20 MED ORDER — TRAMADOL HCL 50 MG PO TABS
50.0000 mg | ORAL_TABLET | Freq: Four times a day (QID) | ORAL | 0 refills | Status: AC | PRN
  Filled 2021-05-20: qty 20, 5d supply, fill #0

## 2021-05-20 MED ORDER — GUAIFENESIN ER 600 MG PO TB12
600.0000 mg | ORAL_TABLET | Freq: Two times a day (BID) | ORAL | 0 refills | Status: AC
  Filled 2021-05-20: qty 10, 5d supply, fill #0

## 2021-05-20 MED ORDER — IPRATROPIUM-ALBUTEROL 0.5-2.5 (3) MG/3ML IN SOLN
3.0000 mL | Freq: Four times a day (QID) | RESPIRATORY_TRACT | 0 refills | Status: AC | PRN
  Filled 2021-05-20: qty 60, 5d supply, fill #0

## 2021-05-20 NOTE — Progress Notes (Signed)
Mobility Specialist Progress Note:   05/20/21 1515  Mobility  Activity Ambulated in hall  Level of Assistance Standby assist, set-up cues, supervision of patient - no hands on  Assistive Device Cane  Distance Ambulated (ft) 570 ft  Mobility Ambulated with assistance in hallway  Mobility Response Tolerated well  Mobility performed by Mobility specialist  $Mobility charge 1 Mobility   Pt asx during ambulation. Steady with a SPC. States he feels much better since chest tube removed. Pt back in bed with all needs met.   Nelta Numbers Mobility Specialist  Phone [phone]

## 2021-05-20 NOTE — Progress Notes (Signed)
PT Cancellation Note  Patient Details Name: Patrick [name] MRN: [id number] DOB: [birth year]   Cancelled Treatment:    Reason Eval/Treat Not Completed: Other (comment).  Pt was occupied with other staff when PT arrived x 2, will retry at another time.   Ivar Drape 05/20/2021, 4:52 PM  Samul Dada, PT PhD Acute Rehab Dept. Number: [facility] [id number] and [facility] [phone]

## 2021-05-20 NOTE — Discharge Instructions (Signed)
PNEUMOTHORAX OR HEMOTHORAX +/- RIB FRACTURES  HOME INSTRUCTIONS   PAIN CONTROL:  Pain is best controlled by a usual combination of three different methods TOGETHER:  Ice/Heat Over the counter pain medication Prescription pain medication You may experience some swelling and bruising in area of broken ribs. Ice packs or heating pads (30-60 minutes up to 6 times a day) will help. Use ice for the first few days to help decrease swelling and bruising, then switch to heat to help relax tight/sore spots and speed recovery. Some people prefer to use ice alone, heat alone, alternating between ice & heat. Experiment to what works for you. Swelling and bruising can take several weeks to resolve.  It is helpful to take an over-the-counter pain medication regularly for the first few weeks. Choose one of the following that works best for you:  Naproxen (Aleve, etc) Two 220mg tabs twice a day Ibuprofen (Advil, etc) Three 200mg tabs four times a day (every meal & bedtime) Acetaminophen (Tylenol, etc) 500-650mg four times a day (every meal & bedtime) A prescription for pain medication (such as oxycodone, hydrocodone, etc) may be given to you upon discharge. Take your pain medication as prescribed.  If you are having problems/concerns with the prescription medicine (does not control pain, nausea, vomiting, rash, itching, etc), please call us [phone] to see if we need to switch you to a different pain medicine that will work better for you and/or control your side effect better. If you need a refill on your pain medication, please contact your pharmacy. They will contact our office to request authorization. Prescriptions will not be filled after 5 pm or on week-ends. Avoid getting constipated. When taking pain medications, it is common to experience some constipation. Increasing fluid intake and taking a fiber supplement (such as Metamucil, Citrucel, FiberCon, MiraLax, etc) 1-2 times a day regularly will  usually help prevent this problem from occurring. A mild laxative (prune juice, Milk of Magnesia, MiraLax, etc) should be taken according to package directions if there are no bowel movements after 48 hours.  Watch out for diarrhea. If you have many loose bowel movements, simplify your diet to bland foods & liquids for a few days. Stop any stool softeners and decrease your fiber supplement. Switching to mild anti-diarrheal medications (Kayopectate, Pepto Bismol) can help. If this worsens or does not improve, please call us. Chest tube site wound: you may remove the dressing from your chest tube site 3 days after the removal of your chest tube. DO NOT shower over the dressing. Once   removed, you may shower as normal. Do not submerge your wound in water for 2-3 weeks.  FOLLOW UP  Please call our office to set up or confirm an appointment for follow up for 2 weeks after discharge. You will need to get a chest xray at either [facility] or [place]. This will be outlined in your follow up instructions. Please call [facility] at [phone] if you have any questions about follow up.  If you have any orthopedic or other injuries you will need to follow up as outlined in your follow up instructions.   WHEN TO CALL US [phone]:  Poor pain control Reactions / problems with new medications (rash/itching, nausea, etc)  Fever over 101.5 F (38.5 C) Worsening swelling or bruising Redness, drainage, pain or swelling around chest tube site Worsening pain, productive cough, difficulty breathing or any other concerning symptoms  The clinic staff is available to answer your questions during   regular business hours (8:30am-5pm). Please don't hesitate to call and ask to speak to one of our nurses for clinical concerns.  If you have a medical emergency, go to the nearest emergency room or call 911.  A surgeon from [facility] is always on call at the hospitals   [facility]   [address], [address], [place], [place] [address] ?  MAIN: [phone] ? TOLL FREE: [phone] ?  FAX [phone]  [email or web address]      Information on Rib Fractures  A rib fracture is a break or crack in one of the bones of the ribs. The ribs are long, curved bones that wrap around your chest and attach to your spine and your breastbone. The ribs protect your heart, lungs, and other organs in the chest. A broken or cracked rib is often painful but is not usually serious. Most rib fractures heal on their own over time. However, rib fractures can be more serious if multiple ribs are broken or if broken ribs move out of place and push against other structures or organs. What are the causes? This condition is caused by: Repetitive movements with high force, such as pitching a baseball or having severe coughing spells. A direct blow to the chest, such as a sports injury, a car accident, or a fall. Cancer that has spread to the bones, which can weaken bones and cause them to break. What are the signs or symptoms? Symptoms of this condition include: Pain when you breathe in or cough. Pain when someone presses on the injured area. Feeling short of breath. How is this diagnosed? This condition is diagnosed with a physical exam and medical history. Imaging tests may also be done, such as: Chest X-ray. CT scan. MRI. Bone scan. Chest ultrasound. How is this treated? Treatment for this condition depends on the severity of the fracture. Most rib fractures usually heal on their own in 1-3 months. Sometimes healing takes longer if there is a cough that does not stop or if there are other activities that make the injury worse (aggravating factors). While you heal, you will be given medicines to control the pain. You will also be taught deep breathing exercises. Severe injuries may require hospitalization or surgery. Follow these instructions at home: Managing pain,  stiffness, and swelling If directed, apply ice to the injured area. Put ice in a plastic bag. Place a towel between your skin and the bag. Leave the ice on for 20 minutes, 2-3 times a day. Take over-the-counter and prescription medicines only as told by your health care provider. Activity Avoid a lot of activity and any activities or movements that cause pain. Be careful during activities and avoid bumping the injured rib. Slowly increase your activity as told by your health care provider. General instructions Do deep breathing exercises as told by your health care provider. This helps prevent pneumonia, which is a common complication of a broken rib. Your health care provider may instruct you to: Take deep breaths several times a day. Try to cough several times a day, holding a pillow against the injured area. Use a device called incentive spirometer to practice deep breathing several times a day. Drink enough fluid to keep your urine pale yellow. Do not wear a rib belt or binder. These restrict breathing, which can lead to pneumonia. Keep all follow-up visits as told by your health care provider. This is important. Contact a health care provider if: You have a fever. Get   help right away if: You have difficulty breathing or you are short of breath. You develop a cough that does not stop, or you cough up thick or bloody sputum. You have nausea, vomiting, or pain in your abdomen. Your pain gets worse and medicine does not help. Summary A rib fracture is a break or crack in one of the bones of the ribs. A broken or cracked rib is often painful but is not usually serious. Most rib fractures heal on their own over time. Treatment for this condition depends on the severity of the fracture. Avoid a lot of activity and any activities or movements that cause pain. This information is not intended to replace advice given to you by your health care provider. Make sure you discuss any questions  you have with your health care provider. Document Released: 05/15/2005 Document Revised: 08/14/2016 Document Reviewed: 08/14/2016 Elsevier Interactive Patient Education  2019 Else[employer] Inc[employer]    Pneumothorax A pneumothorax is commonly called a collapsed lung. It is a condition in which air leaks from a lung and builds up between the thin layer of tissue that covers the lungs (visceral pleura) and the interior wall of the chest cavity (parietal pleura). The air gets trapped outside the lung, between the lung and the chest wall (pleural space). The air takes up space and prevents the lung from fully expanding. This condition sometimes occurs suddenly with no apparent cause. The buildup of air may be small or large. A small pneumothorax may go away on its own. A large pneumothorax will require treatment and hospitalization. What are the causes? This condition may be caused by: Trauma and injury to the chest wall. Surgery and other medical procedures. A complication of an underlying lung problem, especially chronic obstructive pulmonary disease (COPD) or emphysema. Sometimes the cause of this condition is not known. What increases the risk? You are more likely to develop this condition if: You have an underlying lung problem. You smoke. You are 20-40 years old, male, tall, and underweight. You have a personal or family history of pneumothorax. You have an eating disorder (anorexia nervosa). This condition can also happen quickly, even in people with no history of lung problems. What are the signs or symptoms? Sometimes a pneumothorax will have no symptoms. When symptoms are present, they can include: Chest pain. Shortness of breath. Increased rate of breathing. Bluish color to your lips or skin (cyanosis). How is this diagnosed? This condition may be diagnosed by: A medical history and physical exam. A chest X-ray, chest CT scan, or ultrasound. How is this treated? Treatment depends on  how severe your condition is. The goal of treatment is to remove the extra air and allow your lung to expand back to its normal size. For a small pneumothorax: No treatment may be needed. Extra oxygen is sometimes used to make it go away more quickly. For a large pneumothorax or a pneumothorax that is causing symptoms, a procedure is done to drain the air from your lungs. To do this, a health care provider may use: A needle with a syringe. This is used to suck air from a pleural space where no additional leakage is taking place. A chest tube. This is used to suck air where there is ongoing leakage into the pleural space. The chest tube may need to remain in place for several days until the air leak has healed. In more severe cases, surgery may be needed to repair the damage that is causing the leak. If you   have multiple pneumothorax episodes or have an air leak that will not heal, a procedure called a pleurodesis may be done. A medicine is placed in the pleural space to irritate the tissues around the lung so that the lung will stick to the chest wall, seal any leaks, and stop any buildup of air in that space. If you have an underlying lung problem, severe symptoms, or a large pneumothorax you will usually need to stay in the hospital. Follow these instructions at home: Lifestyle Do not use any products that contain nicotine or tobacco, such as cigarettes and e-cigarettes. These are major risk factors in pneumothorax. If you need help quitting, ask your health care provider. Do not lift anything that is heavier than 10 lb (4.5 kg), or the limit that your health care provider tells you, until he or she says that it is safe. Avoid activities that take a lot of effort (strenuous) for as long as told by your health care provider. Return to your normal activities as told by your health care provider. Ask your health care provider what activities are safe for you. Do not fly in an airplane or scuba dive  until your health care provider says it is okay. General instructions Take over-the-counter and prescription medicines only as told by your health care provider. If a cough or pain makes it difficult for you to sleep at night, try sleeping in a semi-upright position in a recliner or by using 2 or 3 pillows. If you had a chest tube and it was removed, ask your health care provider when you can remove the bandage (dressing). While the dressing is in place, do not allow it to get wet. Keep all follow-up visits as told by your health care provider. This is important. Contact a health care provider if: You cough up thick mucus (sputum) that is yellow or green in color. You were treated with a chest tube, and you have redness, increasing pain, or discharge at the site where it was placed. Get help right away if: You have increasing chest pain or shortness of breath. You have a cough that will not go away. You begin coughing up blood. You have pain that is getting worse or is not controlled with medicines. The site where your chest tube was located opens up. You feel air coming out of the site where the chest tube was placed. You have a fever or persistent symptoms for more than 2-3 days. You have a fever and your symptoms suddenly get worse. These symptoms may represent a serious problem that is an emergency. Do not wait to see if the symptoms will go away. Get medical help right away. Call your local emergency services (911 in the [place] Do not drive yourself to the hospital. Summary A pneumothorax, commonly called a collapsed lung, is a condition in which air leaks from a lung and gets trapped between the lung and the chest wall (pleural space). The buildup of air may be small or large. A small pneumothorax may go away on its own. A large pneumothorax will require treatment and hospitalization. Treatment for this condition depends on how severe the pneumothorax is. The goal of treatment is to  remove the extra air and allow the lung to expand back to its normal size. This information is not intended to replace advice given to you by your health care provider. Make sure you discuss any questions you have with your health care provider. Document Released: 05/15/2005 Document Revised: 04/23/2017 Document   Reviewed: 04/23/2017 Elsevier Interactive Patient Education  2019 Elsevier Inc.  

## 2021-05-20 NOTE — Progress Notes (Signed)
Patient ID: Patrick [name], male   DOB: [birth year], 55 y.o.   MRN: [id number] Patrick [name] had very little from his right chest tube over the past 24 hours.  He is breathing better and is more comfortable.  The plan is to remove his chest tube today and plan discharge tomorrow.    Wenda Low, MD, FACS

## 2021-05-20 NOTE — Plan of Care (Signed)
Problem: Education: Goal: Knowledge of General Education information will improve Description: Including pain rating scale, medication(s)/side effects and non-pharmacologic comfort measures Outcome: Completed/Met   Problem: Health Behavior/Discharge Planning: Goal: Ability to manage health-related needs will improve Outcome: Completed/Met   Problem: Clinical Measurements: Goal: Ability to maintain clinical measurements within normal limits will improve Outcome: Completed/Met Goal: Will remain free from infection Outcome: Completed/Met Goal: Diagnostic test results will improve Outcome: Completed/Met Goal: Respiratory complications will improve Outcome: Completed/Met Goal: Cardiovascular complication will be avoided Outcome: Completed/Met   Problem: Nutrition: Goal: Adequate nutrition will be maintained Outcome: Completed/Met   Problem: Coping: Goal: Level of anxiety will decrease Outcome: Completed/Met   Problem: Elimination: Goal: Will not experience complications related to bowel motility Outcome: Completed/Met Goal: Will not experience complications related to urinary retention Outcome: Completed/Met   Problem: Pain Managment: Goal: General experience of comfort will improve Outcome: Completed/Met   Problem: Safety: Goal: Ability to remain free from injury will improve Outcome: Completed/Met   Problem: Skin Integrity: Goal: Risk for impaired skin integrity will decrease Outcome: Completed/Met   

## 2021-05-20 NOTE — TOC Progression Note (Signed)
Transition of Care [facility]) - Progression Note    Patient Details  Name: Patrick [name] MRN: [id number] Date of Birth: 12-20-1965  Transition of Care [facility]) CM/SW Contact  Glennon Mac, RN Phone Number: 05/20/2021, 2:04 PM  Clinical Narrative:    Plan to remove chest tube today, and likely discharge tomorrow.  PT/OT recommending no outpatient follow-up, and DME for home. Pt accepting of recommended DME; cane, tub bench and 3 in 1 ordered from [employer], to be delivered to bedside prior to dc. Pt plans to dc home with his friend, Jasmine December to provide assistance.  Pt denies need for medication assistance.  No other dc needs identified.    Expected Discharge Plan: Home/Self Care Barriers to Discharge: Continued Medical Work up  Expected Discharge Plan and Services Expected Discharge Plan: Home/Self Care   Discharge Planning Services: CM Consult   Living arrangements for the past 2 months: Single Family Home                 DME Arranged: Gilmer Mor, Tub bench, 3-N-1 DME Agency: [employer] Date DME Agency Contacted: 05/20/21 Time DME Agency Contacted: [phone] Representative spoke with at DME Agency: Velna Hatchet             Social Determinants of Health (SDOH) Interventions    Readmission Risk Interventions No flowsheet data found.  Quintella Baton, RN, BSN  Trauma/Neuro ICU Case Manager [phone]
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
## 2021-05-20 NOTE — Progress Notes (Addendum)
Progress Note     Subjective: Just worked with OT. Pain is controlled. Feels breathing is improved with slight cough. Using IS. Tolerating diet   Objective: Vital signs in last 24 hours: Temp:  [97.6 F (36.4 C)-98.1 F (36.7 C)] 97.9 F (36.6 C) (12/23 0754) Pulse Rate:  [64-79] 67 (12/23 0754) Resp:  [17-18] 18 (12/23 0754) BP: (98-103)/(63-68) 101/67 (12/23 0754) SpO2:  [92 %-95 %] 93 % (12/23 0754) Last BM Date:  (unknown by pt)  Intake/Output from previous day: 12/22 0701 - 12/23 0700 In: 340 [P.O.:340] Out: 1560 [Urine:1550; Chest Tube:10] Intake/Output this shift: No intake/output data recorded.  PE: General: pleasant, WD, male who is laying in bed in NAD HEENT: head is normocephalic, atraumatic. Mouth is pink and moist Heart: regular, rate, and rhythm. Palpable radial pulses bilaterally Lungs: CTAB.  Respiratory effort nonlabored on room air. CT on WS with serosanguinous output in tubing. No air leak Abd: soft, NT, ND MSK: all 4 extremities are symmetrical with no cyanosis, clubbing, or edema. Calves soft and no TTP bilaterally Skin: warm and dry with no masses, lesions, or rashes Psych: A&Ox3 with an appropriate affect.    Lab Results:  No results for input(s): WBC, HGB, HCT, PLT in the last 72 hours. BMET No results for input(s): NA, K, CL, CO2, GLUCOSE, BUN, CREATININE, CALCIUM in the last 72 hours. PT/INR No results for input(s): LABPROT, INR in the last 72 hours. CMP     Component Value Date/Time   NA 136 05/16/2021 0340   K 3.8 05/16/2021 0340   CL 107 05/16/2021 0340   CO2 23 05/16/2021 0340   GLUCOSE 109 (H) 05/16/2021 0340   BUN 9 05/16/2021 0340   CREATININE 0.71 05/16/2021 0340   CALCIUM 8.5 (L) 05/16/2021 0340   PROT 6.7 05/15/2021 2026   ALBUMIN 3.7 05/15/2021 2026   AST 20 05/15/2021 2026   ALT 14 05/15/2021 2026   ALKPHOS 79 05/15/2021 2026   BILITOT 0.7 05/15/2021 2026   GFRNONAA >60 05/16/2021 0340   GFRAA >60 07/26/2017 1555    Lipase  No results found for: LIPASE     Studies/Results: DG CHEST PORT 1 VIEW  Result Date: 05/20/2021 CLINICAL DATA:  Pneumothorax right rib fractures chest 2 EXAM: PORTABLE CHEST 1 VIEW COMPARISON:  05/19/2021 FINDINGS: Pigtail chest tube right lateral lung base unchanged. No pneumothorax identified. Mild gas in the right chest wall unchanged. Right lower lobe airspace disease unchanged. Left lower lobe airspace disease mildly improved. No effusion. Negative for heart failure or edema. IMPRESSION: Right chest tube remains in place.  No pneumothorax Bibasilar airspace disease, unchanged on the right with mild improvement on the left. Electronically Signed   By: Marlan Palau M.D.   On: 05/20/2021 13:36   DG CHEST PORT 1 VIEW  Result Date: 05/19/2021 CLINICAL DATA:  Pneumothorax, chest tube, rib fractures EXAM: PORTABLE CHEST 1 VIEW COMPARISON:  05/18/2021 FINDINGS: Heart is normal size. Right chest tube remains in place. No visible pneumothorax. Bibasilar opacities are similar prior study. No visible effusions. Subcutaneous air in the right chest wall. IMPRESSION: Right chest tube remains in place without visible pneumothorax currently. Stable bibasilar opacities. Electronically Signed   By: Charlett Nose M.D.   On: 05/19/2021 08:14    Anti-infectives: Anti-infectives (From admission, onward)    None        Assessment/Plan  PSBV R PTX - CXR stable wiht CT on WS. CT output < 150/24 hours.  R 3-6 rib fx's w/  pulm cont - multimodal pain control (wean IV pain meds). Pulm toilet. On room air. PT/OT R elbow pain - Plain film negative Hx COPD - PRN duonebs. Mucinex. IS/FV FEN - Reg diet. SLIV.  VTE - SCDs, Lovenox ID - None Foley - None, voiding Dispo - CT on WS this am with minimal output - CXR today stable to improved without PTX. Removed CT 1430 and ordered CXR in 4 hours. Cleared by PT/OT. Plans to stay with a friend at d/c. Follow pm cxr. Possible am dc tomorrow - DME has been  ordered      LOS: 4 days    Eric Form, [facility] 05/20/2021, 2:46 PM Please see Amion for pager number during day hours 7:00am-4:30pm
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
## 2023-09-28 ENCOUNTER — Ambulatory Visit: Payer: Self-pay | Admitting: Nurse Practitioner

## 2023-09-28 ENCOUNTER — Encounter: Payer: Self-pay | Admitting: Nurse Practitioner

## 2023-09-28 LAB — GLUCOSE, POCT (MANUAL RESULT ENTRY): POC Glucose: 150 mg/dL — AB (ref 70–99)

## 2023-09-28 NOTE — Progress Notes (Signed)
 Pt smokes 1 ppd cigarettes, not ready to stop.
